# Patient Record
Sex: Male | Born: 1935 | Race: White | Hispanic: No | Smoking: Former smoker
Health system: Southern US, Community
[De-identification: ages and names within clinical notes are randomized; demographics above are authoritative.]

## PROBLEM LIST (undated history)

## (undated) DIAGNOSIS — C801 Malignant (primary) neoplasm, unspecified: Secondary | ICD-10-CM

## (undated) DIAGNOSIS — R011 Cardiac murmur, unspecified: Secondary | ICD-10-CM

## (undated) DIAGNOSIS — I10 Essential (primary) hypertension: Secondary | ICD-10-CM

## (undated) DIAGNOSIS — E785 Hyperlipidemia, unspecified: Secondary | ICD-10-CM

## (undated) DIAGNOSIS — I219 Acute myocardial infarction, unspecified: Secondary | ICD-10-CM

## (undated) DIAGNOSIS — D649 Anemia, unspecified: Secondary | ICD-10-CM

## (undated) DIAGNOSIS — I4891 Unspecified atrial fibrillation: Secondary | ICD-10-CM

## (undated) DIAGNOSIS — G459 Transient cerebral ischemic attack, unspecified: Secondary | ICD-10-CM

## (undated) DIAGNOSIS — Z5189 Encounter for other specified aftercare: Secondary | ICD-10-CM

## (undated) DIAGNOSIS — M1990 Unspecified osteoarthritis, unspecified site: Secondary | ICD-10-CM

## (undated) HISTORY — PX: CORONARY ARTERY BYPASS GRAFT: SHX141

## (undated) HISTORY — DX: Unspecified atrial fibrillation: I48.91

## (undated) HISTORY — DX: Acute myocardial infarction, unspecified: I21.9

## (undated) HISTORY — PX: OTHER SURGICAL HISTORY: SHX169

## (undated) HISTORY — PX: THORACOTOMY: SUR1349

## (undated) HISTORY — PX: COLON SURGERY: SHX602

## (undated) HISTORY — DX: Anemia, unspecified: D64.9

## (undated) HISTORY — DX: Encounter for other specified aftercare: Z51.89

## (undated) HISTORY — DX: Unspecified osteoarthritis, unspecified site: M19.90

## (undated) HISTORY — PX: TOTAL HIP ARTHROPLASTY: SHX124

## (undated) HISTORY — DX: Transient cerebral ischemic attack, unspecified: G45.9

## (undated) HISTORY — DX: Hyperlipidemia, unspecified: E78.5

## (undated) HISTORY — DX: Cardiac murmur, unspecified: R01.1

## (undated) HISTORY — DX: Malignant (primary) neoplasm, unspecified: C80.1

## (undated) HISTORY — DX: Essential (primary) hypertension: I10

---

## 2000-12-03 ENCOUNTER — Encounter (INDEPENDENT_AMBULATORY_CARE_PROVIDER_SITE_OTHER): Payer: Self-pay

## 2000-12-03 ENCOUNTER — Encounter: Payer: Self-pay | Admitting: Gastroenterology

## 2000-12-03 ENCOUNTER — Other Ambulatory Visit: Admission: RE | Admit: 2000-12-03 | Discharge: 2000-12-03 | Payer: Self-pay | Admitting: Gastroenterology

## 2001-03-04 ENCOUNTER — Encounter: Payer: Self-pay | Admitting: Gastroenterology

## 2001-03-04 ENCOUNTER — Encounter (INDEPENDENT_AMBULATORY_CARE_PROVIDER_SITE_OTHER): Payer: Self-pay | Admitting: Specialist

## 2001-03-04 ENCOUNTER — Other Ambulatory Visit: Admission: RE | Admit: 2001-03-04 | Discharge: 2001-03-04 | Payer: Self-pay | Admitting: Gastroenterology

## 2001-03-14 ENCOUNTER — Inpatient Hospital Stay (HOSPITAL_COMMUNITY): Admission: EM | Admit: 2001-03-14 | Discharge: 2001-03-21 | Payer: Self-pay | Admitting: Emergency Medicine

## 2001-03-14 ENCOUNTER — Encounter: Payer: Self-pay | Admitting: Emergency Medicine

## 2001-03-14 ENCOUNTER — Encounter (INDEPENDENT_AMBULATORY_CARE_PROVIDER_SITE_OTHER): Payer: Self-pay | Admitting: *Deleted

## 2001-03-15 ENCOUNTER — Encounter: Payer: Self-pay | Admitting: Orthopedic Surgery

## 2001-03-21 ENCOUNTER — Inpatient Hospital Stay (HOSPITAL_COMMUNITY)
Admission: AD | Admit: 2001-03-21 | Discharge: 2001-03-27 | Payer: Self-pay | Admitting: Physical Medicine & Rehabilitation

## 2002-10-01 ENCOUNTER — Encounter: Payer: Self-pay | Admitting: Gastroenterology

## 2004-03-14 ENCOUNTER — Encounter: Payer: Self-pay | Admitting: Gastroenterology

## 2004-03-14 ENCOUNTER — Encounter (INDEPENDENT_AMBULATORY_CARE_PROVIDER_SITE_OTHER): Payer: Self-pay | Admitting: *Deleted

## 2004-05-09 ENCOUNTER — Inpatient Hospital Stay (HOSPITAL_COMMUNITY): Admission: AD | Admit: 2004-05-09 | Discharge: 2004-05-27 | Payer: Self-pay | Admitting: General Surgery

## 2004-05-09 ENCOUNTER — Encounter (INDEPENDENT_AMBULATORY_CARE_PROVIDER_SITE_OTHER): Payer: Self-pay | Admitting: *Deleted

## 2004-05-09 ENCOUNTER — Ambulatory Visit: Payer: Self-pay | Admitting: Internal Medicine

## 2004-05-15 ENCOUNTER — Encounter: Payer: Self-pay | Admitting: Cardiology

## 2004-06-14 ENCOUNTER — Ambulatory Visit: Payer: Self-pay | Admitting: *Deleted

## 2004-07-05 ENCOUNTER — Ambulatory Visit: Payer: Self-pay | Admitting: *Deleted

## 2004-07-06 ENCOUNTER — Ambulatory Visit: Payer: Self-pay | Admitting: Internal Medicine

## 2004-07-06 ENCOUNTER — Inpatient Hospital Stay (HOSPITAL_COMMUNITY): Admission: EM | Admit: 2004-07-06 | Discharge: 2004-07-10 | Payer: Self-pay | Admitting: Emergency Medicine

## 2004-07-11 ENCOUNTER — Emergency Department (HOSPITAL_COMMUNITY): Admission: EM | Admit: 2004-07-11 | Discharge: 2004-07-12 | Payer: Self-pay | Admitting: Emergency Medicine

## 2004-07-19 ENCOUNTER — Ambulatory Visit: Payer: Self-pay | Admitting: Gastroenterology

## 2004-08-14 ENCOUNTER — Encounter (HOSPITAL_COMMUNITY): Admission: RE | Admit: 2004-08-14 | Discharge: 2004-11-12 | Payer: Self-pay | Admitting: *Deleted

## 2004-09-04 ENCOUNTER — Ambulatory Visit: Payer: Self-pay | Admitting: Gastroenterology

## 2004-09-06 ENCOUNTER — Ambulatory Visit: Payer: Self-pay | Admitting: Gastroenterology

## 2004-10-02 ENCOUNTER — Ambulatory Visit: Payer: Self-pay | Admitting: Gastroenterology

## 2004-11-13 ENCOUNTER — Encounter (HOSPITAL_COMMUNITY): Admission: RE | Admit: 2004-11-13 | Discharge: 2004-11-27 | Payer: Self-pay | Admitting: *Deleted

## 2004-11-20 ENCOUNTER — Ambulatory Visit: Payer: Self-pay | Admitting: Gastroenterology

## 2005-06-29 ENCOUNTER — Ambulatory Visit: Payer: Self-pay | Admitting: Internal Medicine

## 2006-02-20 IMAGING — CR DG CHEST 2V
2 series · 2 of 2 positions shown · non-contrast
Comparison: none

CLINICAL DATA: Preoperative evaluation. 
 PA AND LATERAL CHEST 05/01/04
 Postsurgical changes secondary to median sternotomy and CABG procedure.  The heart is upper normal.  
 IMPRESSION
 As above.

[view not recorded (1 of 2)]
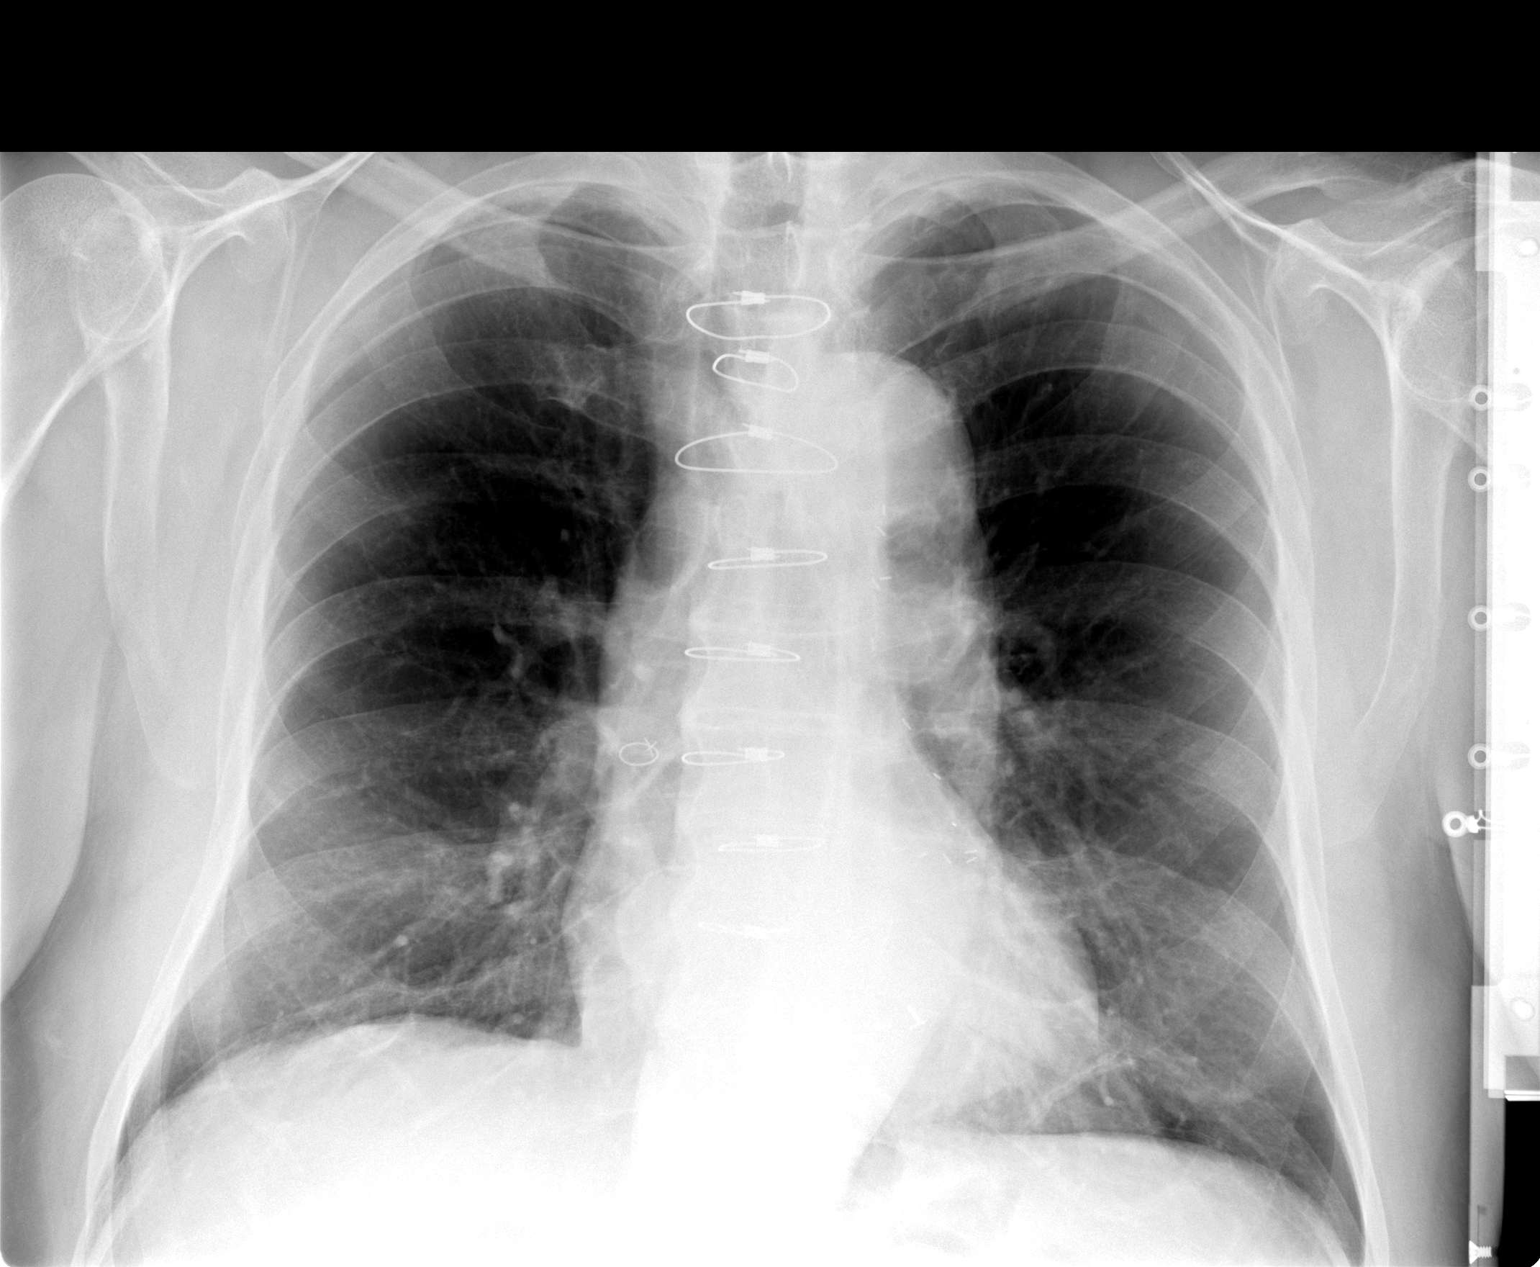

[view not recorded (2 of 2)]
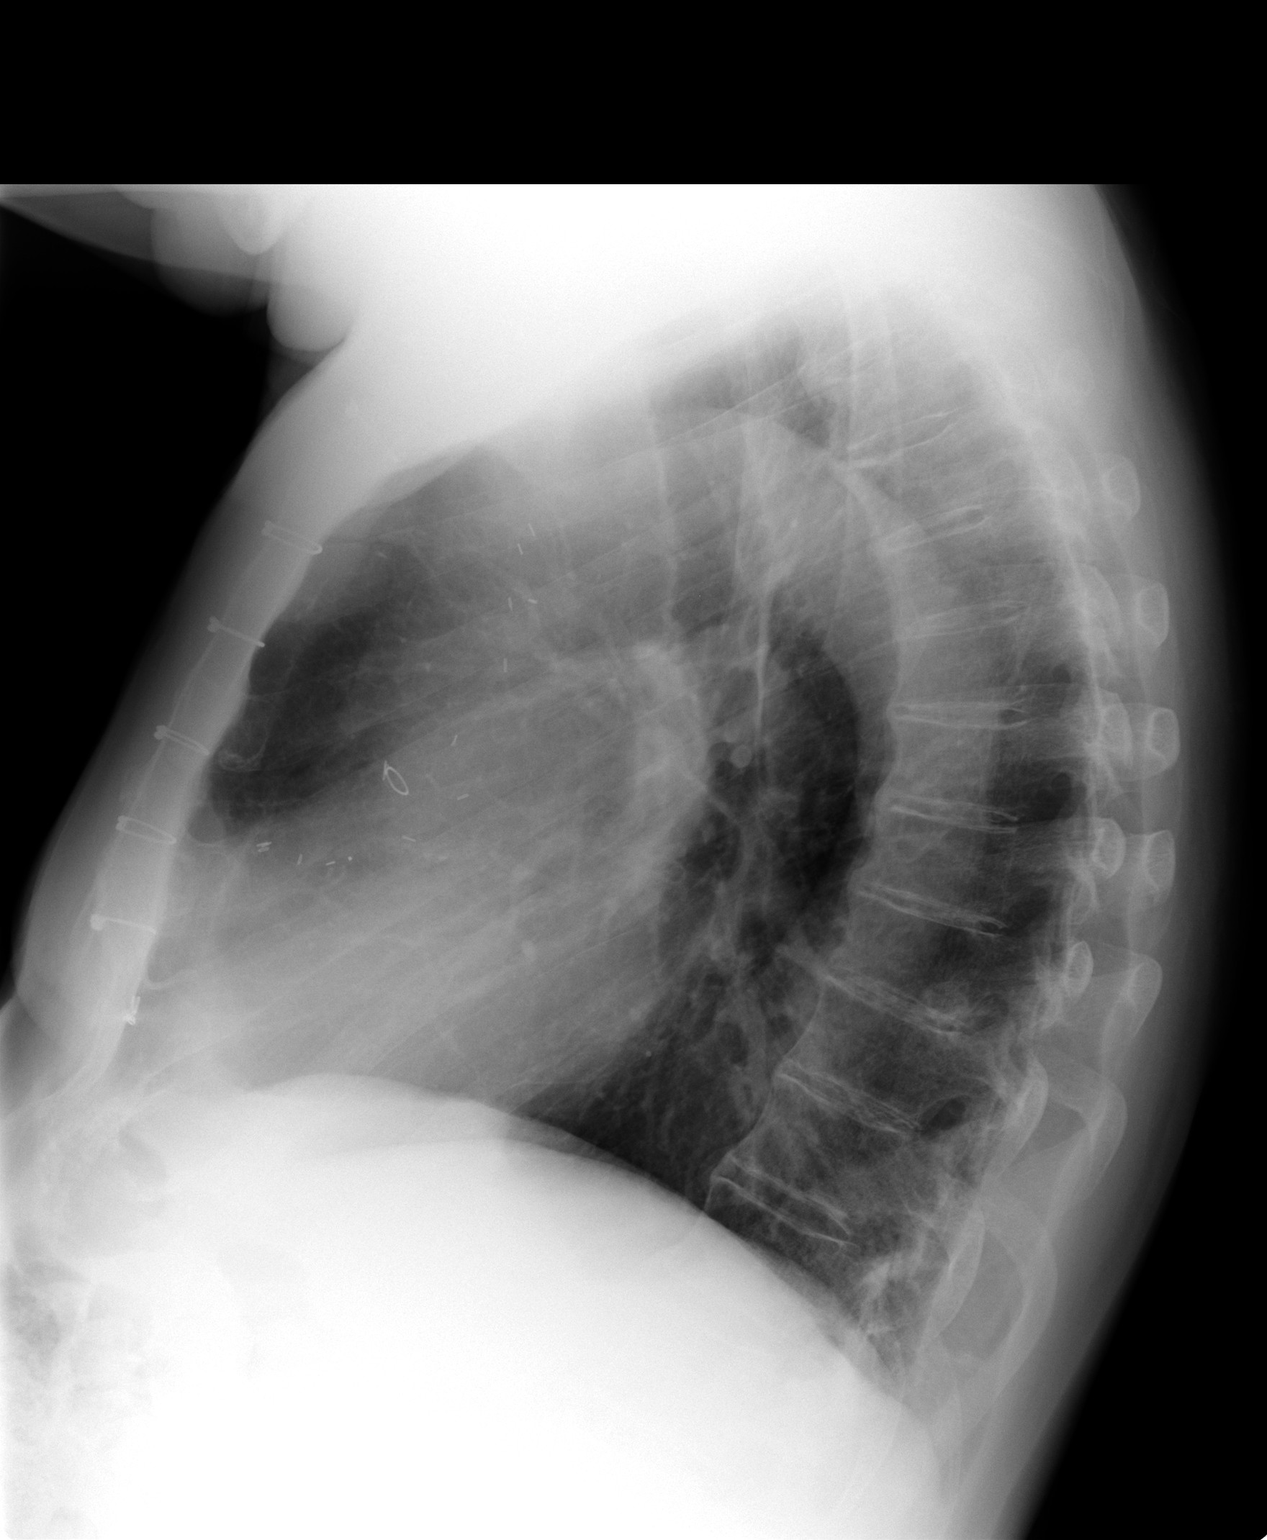

[2 of 2 positions shown; findings below may reference images not displayed]

## 2006-07-15 ENCOUNTER — Ambulatory Visit: Payer: Self-pay | Admitting: *Deleted

## 2006-07-17 ENCOUNTER — Ambulatory Visit: Payer: Self-pay | Admitting: *Deleted

## 2006-10-17 ENCOUNTER — Ambulatory Visit: Payer: Self-pay

## 2006-10-24 ENCOUNTER — Ambulatory Visit: Payer: Self-pay | Admitting: *Deleted

## 2006-11-12 ENCOUNTER — Ambulatory Visit: Payer: Self-pay | Admitting: *Deleted

## 2006-11-27 ENCOUNTER — Ambulatory Visit: Payer: Self-pay

## 2006-12-12 ENCOUNTER — Ambulatory Visit: Payer: Self-pay | Admitting: *Deleted

## 2006-12-16 ENCOUNTER — Ambulatory Visit: Payer: Self-pay | Admitting: *Deleted

## 2006-12-16 ENCOUNTER — Ambulatory Visit: Payer: Self-pay | Admitting: Cardiology

## 2006-12-17 LAB — CONVERTED CEMR LAB
ALT: 13 units/L (ref 0–40)
AST: 20 units/L (ref 0–37)
Albumin: 3.6 g/dL (ref 3.5–5.2)
Alkaline Phosphatase: 74 units/L (ref 39–117)
BUN: 17 mg/dL (ref 6–23)
Bilirubin, Direct: 0.1 mg/dL (ref 0.0–0.3)
CO2: 28 meq/L (ref 19–32)
Calcium: 8.7 mg/dL (ref 8.4–10.5)
Chloride: 107 meq/L (ref 96–112)
Cholesterol: 127 mg/dL (ref 0–200)
Creatinine, Ser: 1.1 mg/dL (ref 0.4–1.5)
GFR calc Af Amer: 83 mL/min
GFR calc non Af Amer: 68 mL/min
Glucose, Bld: 108 mg/dL — ABNORMAL HIGH (ref 70–99)
HDL: 27.8 mg/dL — ABNORMAL LOW (ref >39.0)
LDL Cholesterol: 74 mg/dL (ref 0–99)
Potassium: 4 meq/L (ref 3.5–5.1)
Sodium: 139 meq/L (ref 135–145)
TSH: 1.88 microintl units/mL (ref 0.35–5.50)
Total Bilirubin: 0.8 mg/dL (ref 0.3–1.2)
Total CHOL/HDL Ratio: 4.6
Total Protein: 6.8 g/dL (ref 6.0–8.3)
Triglycerides: 127 mg/dL (ref 0–149)
VLDL: 25 mg/dL (ref 0–40)

## 2006-12-19 ENCOUNTER — Ambulatory Visit: Payer: Self-pay | Admitting: Cardiology

## 2006-12-25 ENCOUNTER — Ambulatory Visit: Payer: Self-pay

## 2006-12-25 ENCOUNTER — Ambulatory Visit: Payer: Self-pay | Admitting: Cardiology

## 2006-12-25 ENCOUNTER — Ambulatory Visit: Payer: Self-pay | Admitting: Internal Medicine

## 2006-12-25 LAB — CONVERTED CEMR LAB
INR: 6 (ref 0.9–2.0)
Prothrombin Time: 31.8 s (ref 10.0–14.0)

## 2006-12-31 ENCOUNTER — Ambulatory Visit: Payer: Self-pay | Admitting: Cardiology

## 2007-01-07 ENCOUNTER — Ambulatory Visit: Payer: Self-pay | Admitting: Cardiology

## 2007-01-17 ENCOUNTER — Ambulatory Visit: Payer: Self-pay | Admitting: Cardiovascular Disease

## 2007-01-29 ENCOUNTER — Ambulatory Visit: Payer: Self-pay | Admitting: Cardiology

## 2007-02-12 ENCOUNTER — Ambulatory Visit: Payer: Self-pay | Admitting: Internal Medicine

## 2007-03-05 ENCOUNTER — Ambulatory Visit: Payer: Self-pay | Admitting: Cardiology

## 2007-03-26 ENCOUNTER — Ambulatory Visit: Payer: Self-pay | Admitting: Internal Medicine

## 2007-04-02 ENCOUNTER — Ambulatory Visit: Payer: Self-pay | Admitting: Internal Medicine

## 2007-04-30 ENCOUNTER — Ambulatory Visit: Payer: Self-pay | Admitting: Cardiology

## 2007-05-28 ENCOUNTER — Ambulatory Visit: Payer: Self-pay | Admitting: Cardiology

## 2007-06-24 ENCOUNTER — Ambulatory Visit: Payer: Self-pay | Admitting: Internal Medicine

## 2007-07-22 ENCOUNTER — Ambulatory Visit: Payer: Self-pay | Admitting: Cardiology

## 2007-07-28 ENCOUNTER — Ambulatory Visit: Payer: Self-pay | Admitting: Gastroenterology

## 2007-08-01 ENCOUNTER — Encounter: Payer: Self-pay | Admitting: Internal Medicine

## 2007-08-19 ENCOUNTER — Ambulatory Visit: Payer: Self-pay | Admitting: Cardiovascular Disease

## 2007-08-21 ENCOUNTER — Ambulatory Visit: Payer: Self-pay | Admitting: Internal Medicine

## 2007-08-21 LAB — CONVERTED CEMR LAB
ALT: 18 units/L (ref 0–53)
AST: 23 units/L (ref 0–37)
Albumin: 3.8 g/dL (ref 3.5–5.2)
Alkaline Phosphatase: 76 units/L (ref 39–117)
Bilirubin, Direct: 0.2 mg/dL (ref 0.0–0.3)
Cholesterol: 137 mg/dL (ref 0–200)
HDL: 26.8 mg/dL — ABNORMAL LOW (ref >39.0)
LDL Cholesterol: 81 mg/dL (ref 0–99)
Total Bilirubin: 1.5 mg/dL — ABNORMAL HIGH (ref 0.3–1.2)
Total CHOL/HDL Ratio: 5.1
Total Protein: 7.7 g/dL (ref 6.0–8.3)
Triglycerides: 144 mg/dL (ref 0–149)
VLDL: 29 mg/dL (ref 0–40)

## 2007-08-25 ENCOUNTER — Ambulatory Visit: Payer: Self-pay | Admitting: Internal Medicine

## 2007-09-16 ENCOUNTER — Ambulatory Visit: Payer: Self-pay | Admitting: Cardiology

## 2007-10-14 ENCOUNTER — Telehealth: Payer: Self-pay | Admitting: Internal Medicine

## 2007-10-14 ENCOUNTER — Ambulatory Visit: Payer: Self-pay | Admitting: Cardiology

## 2007-10-17 ENCOUNTER — Ambulatory Visit: Payer: Self-pay | Admitting: Internal Medicine

## 2007-10-17 DIAGNOSIS — Z860100 Personal history of colon polyps, unspecified: Secondary | ICD-10-CM | POA: Insufficient documentation

## 2007-10-17 DIAGNOSIS — I10 Essential (primary) hypertension: Secondary | ICD-10-CM

## 2007-10-17 DIAGNOSIS — Z85038 Personal history of other malignant neoplasm of large intestine: Secondary | ICD-10-CM | POA: Insufficient documentation

## 2007-10-17 DIAGNOSIS — N400 Enlarged prostate without lower urinary tract symptoms: Secondary | ICD-10-CM

## 2007-10-17 DIAGNOSIS — D509 Iron deficiency anemia, unspecified: Secondary | ICD-10-CM

## 2007-10-17 DIAGNOSIS — I4891 Unspecified atrial fibrillation: Secondary | ICD-10-CM

## 2007-10-17 DIAGNOSIS — E785 Hyperlipidemia, unspecified: Secondary | ICD-10-CM

## 2007-10-17 DIAGNOSIS — I252 Old myocardial infarction: Secondary | ICD-10-CM | POA: Insufficient documentation

## 2007-10-17 DIAGNOSIS — M109 Gout, unspecified: Secondary | ICD-10-CM

## 2007-10-17 DIAGNOSIS — Z8679 Personal history of other diseases of the circulatory system: Secondary | ICD-10-CM

## 2007-10-17 DIAGNOSIS — I2510 Atherosclerotic heart disease of native coronary artery without angina pectoris: Secondary | ICD-10-CM | POA: Insufficient documentation

## 2007-10-17 DIAGNOSIS — Z8719 Personal history of other diseases of the digestive system: Secondary | ICD-10-CM

## 2007-10-17 DIAGNOSIS — I739 Peripheral vascular disease, unspecified: Secondary | ICD-10-CM

## 2007-10-31 ENCOUNTER — Encounter: Payer: Self-pay | Admitting: Internal Medicine

## 2007-11-11 ENCOUNTER — Ambulatory Visit: Payer: Self-pay | Admitting: Cardiology

## 2007-12-09 ENCOUNTER — Ambulatory Visit: Payer: Self-pay | Admitting: Cardiovascular Disease

## 2008-01-06 ENCOUNTER — Ambulatory Visit: Payer: Self-pay | Admitting: Cardiovascular Disease

## 2008-02-03 ENCOUNTER — Ambulatory Visit: Payer: Self-pay | Admitting: Cardiovascular Disease

## 2008-02-17 ENCOUNTER — Ambulatory Visit: Payer: Self-pay | Admitting: Cardiology

## 2008-03-16 ENCOUNTER — Ambulatory Visit: Payer: Self-pay | Admitting: Cardiology

## 2008-03-19 ENCOUNTER — Ambulatory Visit: Payer: Self-pay | Admitting: Internal Medicine

## 2008-03-29 ENCOUNTER — Encounter: Payer: Self-pay | Admitting: Gastroenterology

## 2008-04-13 ENCOUNTER — Ambulatory Visit: Payer: Self-pay | Admitting: Cardiology

## 2008-05-11 ENCOUNTER — Ambulatory Visit: Payer: Self-pay | Admitting: Cardiovascular Disease

## 2008-06-01 ENCOUNTER — Ambulatory Visit: Payer: Self-pay | Admitting: Internal Medicine

## 2008-06-28 ENCOUNTER — Ambulatory Visit: Payer: Self-pay | Admitting: Cardiovascular Disease

## 2008-07-14 ENCOUNTER — Ambulatory Visit: Payer: Self-pay | Admitting: Internal Medicine

## 2008-07-14 ENCOUNTER — Inpatient Hospital Stay (HOSPITAL_COMMUNITY): Admission: AD | Admit: 2008-07-14 | Discharge: 2008-07-18 | Payer: Self-pay | Admitting: Internal Medicine

## 2008-07-14 DIAGNOSIS — N410 Acute prostatitis: Secondary | ICD-10-CM

## 2008-07-14 DIAGNOSIS — A4189 Other specified sepsis: Secondary | ICD-10-CM

## 2008-07-20 ENCOUNTER — Telehealth (INDEPENDENT_AMBULATORY_CARE_PROVIDER_SITE_OTHER): Payer: Self-pay | Admitting: *Deleted

## 2008-07-21 ENCOUNTER — Ambulatory Visit: Payer: Self-pay | Admitting: Internal Medicine

## 2008-07-21 DIAGNOSIS — R350 Frequency of micturition: Secondary | ICD-10-CM

## 2008-07-21 DIAGNOSIS — J189 Pneumonia, unspecified organism: Secondary | ICD-10-CM | POA: Insufficient documentation

## 2008-08-04 ENCOUNTER — Ambulatory Visit: Payer: Self-pay | Admitting: Cardiovascular Disease

## 2008-08-18 ENCOUNTER — Ambulatory Visit: Payer: Self-pay | Admitting: Cardiovascular Disease

## 2008-08-24 ENCOUNTER — Ambulatory Visit: Payer: Self-pay | Admitting: Internal Medicine

## 2008-09-08 ENCOUNTER — Encounter: Payer: Self-pay | Admitting: Internal Medicine

## 2008-09-08 ENCOUNTER — Ambulatory Visit: Payer: Self-pay | Admitting: Cardiology

## 2008-10-06 ENCOUNTER — Ambulatory Visit: Payer: Self-pay | Admitting: Cardiovascular Disease

## 2008-11-10 ENCOUNTER — Ambulatory Visit: Payer: Self-pay | Admitting: Internal Medicine

## 2008-12-08 ENCOUNTER — Ambulatory Visit: Payer: Self-pay | Admitting: Cardiology

## 2008-12-15 ENCOUNTER — Ambulatory Visit: Payer: Self-pay | Admitting: Internal Medicine

## 2008-12-15 LAB — CONVERTED CEMR LAB
ALT: 16 units/L (ref 0–53)
AST: 30 units/L (ref 0–37)
Albumin: 3.8 g/dL (ref 3.5–5.2)
Alkaline Phosphatase: 79 units/L (ref 39–117)
BUN: 17 mg/dL (ref 6–23)
Bilirubin, Direct: 0.4 mg/dL — ABNORMAL HIGH (ref 0.0–0.3)
CO2: 29 meq/L (ref 19–32)
Calcium: 9 mg/dL (ref 8.4–10.5)
Chloride: 109 meq/L (ref 96–112)
Cholesterol: 142 mg/dL (ref 0–200)
Creatinine, Ser: 1 mg/dL (ref 0.4–1.5)
GFR calc non Af Amer: 75.8 mL/min (ref >60)
Glucose, Bld: 117 mg/dL — ABNORMAL HIGH (ref 70–99)
HDL: 28.7 mg/dL — ABNORMAL LOW (ref >39.00)
LDL Cholesterol: 94 mg/dL (ref 0–99)
Potassium: 4.4 meq/L (ref 3.5–5.1)
Sodium: 145 meq/L (ref 135–145)
TSH: 1.54 microintl units/mL (ref 0.35–5.50)
Total Bilirubin: 1.6 mg/dL — ABNORMAL HIGH (ref 0.3–1.2)
Total CHOL/HDL Ratio: 5
Total Protein: 7 g/dL (ref 6.0–8.3)
Triglycerides: 99 mg/dL (ref 0.0–149.0)
VLDL: 19.8 mg/dL (ref 0.0–40.0)

## 2008-12-22 ENCOUNTER — Telehealth (INDEPENDENT_AMBULATORY_CARE_PROVIDER_SITE_OTHER): Payer: Self-pay | Admitting: *Deleted

## 2008-12-22 ENCOUNTER — Ambulatory Visit: Payer: Self-pay | Admitting: Internal Medicine

## 2008-12-22 DIAGNOSIS — L570 Actinic keratosis: Secondary | ICD-10-CM

## 2008-12-22 DIAGNOSIS — R609 Edema, unspecified: Secondary | ICD-10-CM

## 2009-01-05 ENCOUNTER — Ambulatory Visit: Payer: Self-pay | Admitting: Internal Medicine

## 2009-01-05 LAB — CONVERTED CEMR LAB
POC INR: 2.6
Protime: 19.7

## 2009-02-02 ENCOUNTER — Ambulatory Visit: Payer: Self-pay | Admitting: Internal Medicine

## 2009-02-02 ENCOUNTER — Encounter: Payer: Self-pay | Admitting: *Deleted

## 2009-02-02 LAB — CONVERTED CEMR LAB
POC INR: 2.8
Prothrombin Time: 20.2 s

## 2009-02-09 ENCOUNTER — Encounter (INDEPENDENT_AMBULATORY_CARE_PROVIDER_SITE_OTHER): Payer: Self-pay | Admitting: *Deleted

## 2009-02-09 ENCOUNTER — Ambulatory Visit: Payer: Self-pay | Admitting: Gastroenterology

## 2009-02-09 ENCOUNTER — Ambulatory Visit: Payer: Self-pay | Admitting: Internal Medicine

## 2009-02-09 LAB — CONVERTED CEMR LAB
BUN: 19 mg/dL (ref 6–23)
CO2: 29 meq/L (ref 19–32)
Calcium: 9.2 mg/dL (ref 8.4–10.5)
Chloride: 109 meq/L (ref 96–112)
Creatinine, Ser: 1 mg/dL (ref 0.4–1.5)
GFR calc non Af Amer: 75.78 mL/min (ref >60)
Glucose, Bld: 101 mg/dL — ABNORMAL HIGH (ref 70–99)
Potassium: 4.5 meq/L (ref 3.5–5.1)
Sodium: 143 meq/L (ref 135–145)

## 2009-02-11 ENCOUNTER — Ambulatory Visit: Payer: Self-pay | Admitting: Internal Medicine

## 2009-02-18 ENCOUNTER — Telehealth (INDEPENDENT_AMBULATORY_CARE_PROVIDER_SITE_OTHER): Payer: Self-pay | Admitting: *Deleted

## 2009-02-28 ENCOUNTER — Ambulatory Visit: Payer: Self-pay | Admitting: Cardiovascular Disease

## 2009-02-28 ENCOUNTER — Ambulatory Visit: Payer: Self-pay | Admitting: Internal Medicine

## 2009-02-28 ENCOUNTER — Inpatient Hospital Stay (HOSPITAL_COMMUNITY): Admission: EM | Admit: 2009-02-28 | Discharge: 2009-03-02 | Payer: Self-pay | Admitting: Emergency Medicine

## 2009-03-01 ENCOUNTER — Encounter (INDEPENDENT_AMBULATORY_CARE_PROVIDER_SITE_OTHER): Payer: Self-pay | Admitting: Internal Medicine

## 2009-03-02 ENCOUNTER — Ambulatory Visit: Payer: Self-pay | Admitting: Internal Medicine

## 2009-03-03 ENCOUNTER — Telehealth (INDEPENDENT_AMBULATORY_CARE_PROVIDER_SITE_OTHER): Payer: Self-pay | Admitting: *Deleted

## 2009-03-04 ENCOUNTER — Telehealth: Payer: Self-pay | Admitting: Internal Medicine

## 2009-03-05 ENCOUNTER — Telehealth (INDEPENDENT_AMBULATORY_CARE_PROVIDER_SITE_OTHER): Payer: Self-pay | Admitting: Nurse Practitioner

## 2009-03-08 ENCOUNTER — Telehealth: Payer: Self-pay | Admitting: Internal Medicine

## 2009-03-14 ENCOUNTER — Ambulatory Visit: Payer: Self-pay | Admitting: Cardiovascular Disease

## 2009-03-14 LAB — CONVERTED CEMR LAB: POC INR: 2.5

## 2009-03-21 ENCOUNTER — Telehealth: Payer: Self-pay | Admitting: Nurse Practitioner

## 2009-03-21 ENCOUNTER — Telehealth: Payer: Self-pay | Admitting: Internal Medicine

## 2009-03-22 ENCOUNTER — Ambulatory Visit: Payer: Self-pay | Admitting: Internal Medicine

## 2009-03-28 ENCOUNTER — Ambulatory Visit: Payer: Self-pay | Admitting: Internal Medicine

## 2009-03-28 DIAGNOSIS — R3919 Other difficulties with micturition: Secondary | ICD-10-CM

## 2009-03-28 DIAGNOSIS — Z85828 Personal history of other malignant neoplasm of skin: Secondary | ICD-10-CM | POA: Insufficient documentation

## 2009-04-05 ENCOUNTER — Telehealth: Payer: Self-pay | Admitting: Gastroenterology

## 2009-04-05 ENCOUNTER — Telehealth: Payer: Self-pay | Admitting: Internal Medicine

## 2009-04-06 ENCOUNTER — Ambulatory Visit: Payer: Self-pay | Admitting: Internal Medicine

## 2009-04-11 ENCOUNTER — Ambulatory Visit: Payer: Self-pay | Admitting: Cardiovascular Disease

## 2009-04-11 LAB — CONVERTED CEMR LAB: POC INR: 2.9

## 2009-04-28 ENCOUNTER — Ambulatory Visit: Payer: Self-pay | Admitting: Internal Medicine

## 2009-05-18 ENCOUNTER — Encounter: Payer: Self-pay | Admitting: Internal Medicine

## 2009-05-20 ENCOUNTER — Ambulatory Visit: Payer: Self-pay | Admitting: Cardiovascular Disease

## 2009-05-20 LAB — CONVERTED CEMR LAB: POC INR: 2.5

## 2009-06-01 ENCOUNTER — Encounter: Payer: Self-pay | Admitting: Internal Medicine

## 2009-06-08 ENCOUNTER — Ambulatory Visit: Payer: Self-pay | Admitting: Internal Medicine

## 2009-06-08 LAB — CONVERTED CEMR LAB
ALT: 21 units/L (ref 0–53)
AST: 27 units/L (ref 0–37)
Albumin: 3.9 g/dL (ref 3.5–5.2)
Alkaline Phosphatase: 70 units/L (ref 39–117)
BUN: 14 mg/dL (ref 6–23)
Basophils Absolute: 0 10*3/uL (ref 0.0–0.1)
Basophils Relative: 0.1 % (ref 0.0–3.0)
Bilirubin, Direct: 0.2 mg/dL (ref 0.0–0.3)
CO2: 27 meq/L (ref 19–32)
Calcium: 8.8 mg/dL (ref 8.4–10.5)
Chloride: 105 meq/L (ref 96–112)
Cholesterol: 157 mg/dL (ref 0–200)
Cortisol, Plasma: 11 ug/dL
Creatinine, Ser: 1 mg/dL (ref 0.4–1.5)
Eosinophils Absolute: 0.3 10*3/uL (ref 0.0–0.7)
Eosinophils Relative: 4.2 % (ref 0.0–5.0)
GFR calc non Af Amer: 75.72 mL/min (ref >60)
Glucose, Bld: 105 mg/dL — ABNORMAL HIGH (ref 70–99)
HCT: 43.9 % (ref 39.0–52.0)
HDL: 31.3 mg/dL — ABNORMAL LOW (ref >39.00)
Hemoglobin: 14.9 g/dL (ref 13.0–17.0)
LDL Cholesterol: 104 mg/dL — ABNORMAL HIGH (ref 0–99)
Lymphocytes Relative: 21.4 % (ref 12.0–46.0)
Lymphs Abs: 1.6 10*3/uL (ref 0.7–4.0)
MCHC: 33.9 g/dL (ref 30.0–36.0)
MCV: 91.4 fL (ref 78.0–100.0)
Monocytes Absolute: 0.4 10*3/uL (ref 0.1–1.0)
Monocytes Relative: 5.7 % (ref 3.0–12.0)
Neutro Abs: 5.2 10*3/uL (ref 1.4–7.7)
Neutrophils Relative %: 68.6 % (ref 43.0–77.0)
Platelets: 76 10*3/uL — ABNORMAL LOW (ref 150.0–400.0)
Potassium: 4 meq/L (ref 3.5–5.1)
RBC: 4.8 M/uL (ref 4.22–5.81)
RDW: 13 % (ref 11.5–14.6)
Sodium: 140 meq/L (ref 135–145)
TSH: 1.42 microintl units/mL (ref 0.35–5.50)
Total Bilirubin: 1.2 mg/dL (ref 0.3–1.2)
Total CHOL/HDL Ratio: 5
Total Protein: 7.6 g/dL (ref 6.0–8.3)
Triglycerides: 109 mg/dL (ref 0.0–149.0)
VLDL: 21.8 mg/dL (ref 0.0–40.0)
WBC: 7.5 10*3/uL (ref 4.5–10.5)

## 2009-06-14 ENCOUNTER — Ambulatory Visit: Payer: Self-pay | Admitting: Internal Medicine

## 2009-06-14 DIAGNOSIS — D696 Thrombocytopenia, unspecified: Secondary | ICD-10-CM | POA: Insufficient documentation

## 2009-06-14 DIAGNOSIS — Z87891 Personal history of nicotine dependence: Secondary | ICD-10-CM

## 2009-06-17 ENCOUNTER — Ambulatory Visit: Payer: Self-pay | Admitting: Cardiology

## 2009-06-17 LAB — CONVERTED CEMR LAB: POC INR: 2.6

## 2009-07-21 ENCOUNTER — Ambulatory Visit: Payer: Self-pay | Admitting: Cardiology

## 2009-07-21 LAB — CONVERTED CEMR LAB: POC INR: 2.4

## 2009-08-15 ENCOUNTER — Ambulatory Visit: Payer: Self-pay | Admitting: Internal Medicine

## 2009-08-15 LAB — CONVERTED CEMR LAB
BUN: 16 mg/dL (ref 6–23)
Basophils Absolute: 0 10*3/uL (ref 0.0–0.1)
Basophils Relative: 0.1 % (ref 0.0–3.0)
CO2: 23 meq/L (ref 19–32)
Calcium: 9.4 mg/dL (ref 8.4–10.5)
Chloride: 109 meq/L (ref 96–112)
Creatinine, Ser: 1 mg/dL (ref 0.4–1.5)
Eosinophils Absolute: 0.4 10*3/uL (ref 0.0–0.7)
Eosinophils Relative: 4.5 % (ref 0.0–5.0)
GFR calc non Af Amer: 75.68 mL/min (ref >60)
Glucose, Bld: 96 mg/dL (ref 70–99)
HCT: 46.6 % (ref 39.0–52.0)
Hemoglobin: 14.9 g/dL (ref 13.0–17.0)
Lymphocytes Relative: 18.5 % (ref 12.0–46.0)
Lymphs Abs: 1.6 10*3/uL (ref 0.7–4.0)
MCHC: 32.1 g/dL (ref 30.0–36.0)
MCV: 91.1 fL (ref 78.0–100.0)
Monocytes Absolute: 0.6 10*3/uL (ref 0.1–1.0)
Monocytes Relative: 6.5 % (ref 3.0–12.0)
Neutro Abs: 5.9 10*3/uL (ref 1.4–7.7)
Neutrophils Relative %: 70.4 % (ref 43.0–77.0)
Platelets: 90 10*3/uL — ABNORMAL LOW (ref 150.0–400.0)
Potassium: 5.2 meq/L — ABNORMAL HIGH (ref 3.5–5.1)
RBC: 5.11 M/uL (ref 4.22–5.81)
RDW: 13.8 % (ref 11.5–14.6)
Sodium: 142 meq/L (ref 135–145)
WBC: 8.5 10*3/uL (ref 4.5–10.5)

## 2009-08-16 ENCOUNTER — Ambulatory Visit: Payer: Self-pay | Admitting: Cardiology

## 2009-08-16 LAB — CONVERTED CEMR LAB: POC INR: 2.1

## 2009-08-17 ENCOUNTER — Ambulatory Visit: Payer: Self-pay | Admitting: Internal Medicine

## 2009-08-17 DIAGNOSIS — E875 Hyperkalemia: Secondary | ICD-10-CM

## 2009-09-13 ENCOUNTER — Ambulatory Visit: Payer: Self-pay | Admitting: Internal Medicine

## 2009-09-13 LAB — CONVERTED CEMR LAB: POC INR: 2.2

## 2009-10-11 ENCOUNTER — Ambulatory Visit: Payer: Self-pay | Admitting: Cardiology

## 2009-10-11 LAB — CONVERTED CEMR LAB: POC INR: 2.1

## 2009-10-26 ENCOUNTER — Ambulatory Visit: Payer: Self-pay | Admitting: Internal Medicine

## 2009-11-08 ENCOUNTER — Ambulatory Visit: Payer: Self-pay | Admitting: Cardiovascular Disease

## 2009-11-08 LAB — CONVERTED CEMR LAB: POC INR: 2.1

## 2009-11-15 ENCOUNTER — Ambulatory Visit: Payer: Self-pay | Admitting: Internal Medicine

## 2009-11-15 LAB — CONVERTED CEMR LAB
BUN: 18 mg/dL (ref 6–23)
Basophils Absolute: 0 10*3/uL (ref 0.0–0.1)
Basophils Relative: 0.5 % (ref 0.0–3.0)
CO2: 29 meq/L (ref 19–32)
Calcium: 9.2 mg/dL (ref 8.4–10.5)
Chloride: 107 meq/L (ref 96–112)
Creatinine, Ser: 1.3 mg/dL (ref 0.4–1.5)
Eosinophils Absolute: 0.3 10*3/uL (ref 0.0–0.7)
Eosinophils Relative: 4.3 % (ref 0.0–5.0)
GFR calc non Af Amer: 55.88 mL/min (ref >60)
Glucose, Bld: 117 mg/dL — ABNORMAL HIGH (ref 70–99)
HCT: 45.9 % (ref 39.0–52.0)
Hemoglobin: 15.8 g/dL (ref 13.0–17.0)
Lymphocytes Relative: 22.4 % (ref 12.0–46.0)
Lymphs Abs: 1.7 10*3/uL (ref 0.7–4.0)
MCHC: 34.3 g/dL (ref 30.0–36.0)
MCV: 89.8 fL (ref 78.0–100.0)
Monocytes Absolute: 0.7 10*3/uL (ref 0.1–1.0)
Monocytes Relative: 8.7 % (ref 3.0–12.0)
Neutro Abs: 5 10*3/uL (ref 1.4–7.7)
Neutrophils Relative %: 64.1 % (ref 43.0–77.0)
Platelets: 83 10*3/uL — ABNORMAL LOW (ref 150.0–400.0)
Potassium: 5.1 meq/L (ref 3.5–5.1)
RBC: 5.11 M/uL (ref 4.22–5.81)
RDW: 14 % (ref 11.5–14.6)
Sodium: 141 meq/L (ref 135–145)
TSH: 1.66 microintl units/mL (ref 0.35–5.50)
WBC: 7.8 10*3/uL (ref 4.5–10.5)

## 2009-11-16 ENCOUNTER — Ambulatory Visit: Payer: Self-pay | Admitting: Internal Medicine

## 2009-12-06 ENCOUNTER — Ambulatory Visit: Payer: Self-pay | Admitting: Cardiovascular Disease

## 2009-12-06 LAB — CONVERTED CEMR LAB: POC INR: 2

## 2009-12-21 ENCOUNTER — Ambulatory Visit: Payer: Self-pay | Admitting: Hematology & Oncology

## 2010-01-03 ENCOUNTER — Ambulatory Visit: Payer: Self-pay | Admitting: Cardiology

## 2010-01-03 LAB — CONVERTED CEMR LAB: POC INR: 2.4

## 2010-01-11 ENCOUNTER — Encounter: Payer: Self-pay | Admitting: Internal Medicine

## 2010-01-11 LAB — CBC WITH DIFFERENTIAL (CANCER CENTER ONLY)
BASO#: 0.2 10*3/uL (ref 0.0–0.2)
EOS%: 4.4 % (ref 0.0–7.0)
Eosinophils Absolute: 0.4 10*3/uL (ref 0.0–0.5)
HCT: 45.9 % (ref 38.7–49.9)
HGB: 15.7 g/dL (ref 13.0–17.1)
MCH: 31 pg (ref 28.0–33.4)
MCHC: 34.3 g/dL (ref 32.0–35.9)
MONO%: 6.2 % (ref 0.0–13.0)
NEUT%: 67 % (ref 40.0–80.0)

## 2010-01-13 LAB — PROTEIN ELECTROPHORESIS, SERUM
Albumin ELP: 56 % (ref 55.8–66.1)
Albumin ELP: 56.8 % (ref 55.8–66.1)
Alpha-2-Globulin: 9.7 % (ref 7.1–11.8)
Beta Globulin: 6.1 % (ref 4.7–7.2)
Beta Globulin: 6.3 % (ref 4.7–7.2)
Total Protein, Serum Electrophoresis: 7.2 g/dL (ref 6.0–8.3)
Total Protein, Serum Electrophoresis: 7.3 g/dL (ref 6.0–8.3)

## 2010-01-13 LAB — RETICULOCYTES (CHCC)
ABS Retic: 65.8 10*3/uL (ref 19.0–186.0)
Retic Ct Pct: 1.3 % (ref 0.4–3.1)

## 2010-01-13 LAB — VITAMIN B12: Vitamin B-12: 339 pg/mL (ref 211–911)

## 2010-01-31 ENCOUNTER — Ambulatory Visit: Payer: Self-pay | Admitting: Internal Medicine

## 2010-01-31 LAB — CONVERTED CEMR LAB: POC INR: 2.3

## 2010-02-09 ENCOUNTER — Ambulatory Visit: Payer: Self-pay | Admitting: Hematology & Oncology

## 2010-02-22 ENCOUNTER — Encounter: Payer: Self-pay | Admitting: Internal Medicine

## 2010-02-22 LAB — CBC WITH DIFFERENTIAL (CANCER CENTER ONLY)
BASO%: 1.6 % (ref 0.0–2.0)
EOS%: 4.4 % (ref 0.0–7.0)
HCT: 47.9 % (ref 38.7–49.9)
LYMPH#: 2.2 10*3/uL (ref 0.9–3.3)
MCHC: 34 g/dL (ref 32.0–35.9)
NEUT%: 63.3 % (ref 40.0–80.0)
RDW: 11.5 % (ref 10.5–14.6)

## 2010-02-28 ENCOUNTER — Ambulatory Visit: Payer: Self-pay | Admitting: Cardiology

## 2010-02-28 LAB — CONVERTED CEMR LAB: POC INR: 2.1

## 2010-03-17 ENCOUNTER — Telehealth: Payer: Self-pay | Admitting: Internal Medicine

## 2010-03-21 ENCOUNTER — Ambulatory Visit: Payer: Self-pay | Admitting: Internal Medicine

## 2010-03-21 DIAGNOSIS — M25569 Pain in unspecified knee: Secondary | ICD-10-CM

## 2010-03-21 DIAGNOSIS — K5909 Other constipation: Secondary | ICD-10-CM

## 2010-03-21 DIAGNOSIS — M109 Gout, unspecified: Secondary | ICD-10-CM | POA: Insufficient documentation

## 2010-03-28 ENCOUNTER — Ambulatory Visit: Payer: Self-pay | Admitting: Cardiovascular Disease

## 2010-03-28 LAB — CONVERTED CEMR LAB: POC INR: 2.4

## 2010-04-25 ENCOUNTER — Ambulatory Visit: Payer: Self-pay | Admitting: Cardiovascular Disease

## 2010-04-25 LAB — CONVERTED CEMR LAB: POC INR: 2.1

## 2010-05-04 ENCOUNTER — Telehealth: Payer: Self-pay | Admitting: Internal Medicine

## 2010-05-05 ENCOUNTER — Telehealth: Payer: Self-pay | Admitting: Internal Medicine

## 2010-05-23 ENCOUNTER — Ambulatory Visit: Payer: Self-pay | Admitting: Cardiology

## 2010-05-23 LAB — CONVERTED CEMR LAB: POC INR: 2.6

## 2010-05-24 ENCOUNTER — Ambulatory Visit: Payer: Self-pay | Admitting: Internal Medicine

## 2010-05-30 ENCOUNTER — Encounter: Payer: Self-pay | Admitting: Internal Medicine

## 2010-05-31 ENCOUNTER — Ambulatory Visit: Payer: Self-pay | Admitting: Hematology & Oncology

## 2010-06-07 ENCOUNTER — Encounter: Payer: Self-pay | Admitting: Internal Medicine

## 2010-06-07 LAB — CBC WITH DIFFERENTIAL (CANCER CENTER ONLY)
BASO#: 0.1 10e3/uL (ref 0.0–0.2)
BASO%: 0.6 % (ref 0.0–2.0)
EOS%: 5 % (ref 0.0–7.0)
Eosinophils Absolute: 0.4 10e3/uL (ref 0.0–0.5)
HCT: 45.9 % (ref 38.7–49.9)
HGB: 14.9 g/dL (ref 13.0–17.1)
LYMPH#: 1.7 10e3/uL (ref 0.9–3.3)
LYMPH%: 22.9 % (ref 14.0–48.0)
MCH: 29.8 pg (ref 28.0–33.4)
MCHC: 32.5 g/dL (ref 32.0–35.9)
MCV: 92 fL (ref 82–98)
MONO#: 0.6 10e3/uL (ref 0.1–0.9)
MONO%: 7.4 % (ref 0.0–13.0)
NEUT#: 4.9 10e3/uL (ref 1.5–6.5)
NEUT%: 64.1 % (ref 40.0–80.0)
Platelets: 92 10e3/uL — ABNORMAL LOW (ref 145–400)
RBC: 5.01 10e6/uL (ref 4.20–5.70)
RDW: 11.6 % (ref 10.5–14.6)
WBC: 7.6 10e3/uL (ref 4.0–10.0)

## 2010-06-07 LAB — CHCC SATELLITE - SMEAR

## 2010-06-20 ENCOUNTER — Ambulatory Visit: Payer: Self-pay | Admitting: Cardiovascular Disease

## 2010-06-20 LAB — CONVERTED CEMR LAB: POC INR: 2.1

## 2010-07-12 ENCOUNTER — Ambulatory Visit: Payer: Self-pay | Admitting: Internal Medicine

## 2010-07-18 ENCOUNTER — Ambulatory Visit: Payer: Self-pay | Admitting: Cardiology

## 2010-07-18 LAB — CONVERTED CEMR LAB: POC INR: 2.2

## 2010-07-19 LAB — CONVERTED CEMR LAB
BUN: 21 mg/dL (ref 6–23)
Basophils Absolute: 0 10*3/uL (ref 0.0–0.1)
Bilirubin, Direct: 0.2 mg/dL (ref 0.0–0.3)
Cholesterol: 137 mg/dL (ref 0–200)
Creatinine, Ser: 1.2 mg/dL (ref 0.4–1.5)
GFR calc non Af Amer: 64.27 mL/min (ref >60.00)
Glucose, Bld: 107 mg/dL — ABNORMAL HIGH (ref 70–99)
HCT: 45 % (ref 39.0–52.0)
HDL: 24.2 mg/dL — ABNORMAL LOW (ref >39.00)
Lymphs Abs: 1.8 10*3/uL (ref 0.7–4.0)
MCV: 92.3 fL (ref 78.0–100.0)
Monocytes Absolute: 0.8 10*3/uL (ref 0.1–1.0)
Monocytes Relative: 7.6 % (ref 3.0–12.0)
Neutrophils Relative %: 69.8 % (ref 43.0–77.0)
Platelets: 108 10*3/uL — ABNORMAL LOW (ref 150.0–400.0)
Potassium: 5.5 meq/L — ABNORMAL HIGH (ref 3.5–5.1)
RDW: 13.6 % (ref 11.5–14.6)
Specific Gravity, Urine: 1.025 (ref 1.000–1.030)
TSH: 1.52 microintl units/mL (ref 0.35–5.50)
Total Bilirubin: 1.3 mg/dL — ABNORMAL HIGH (ref 0.3–1.2)
Total Protein, Urine: NEGATIVE mg/dL
Urine Glucose: NEGATIVE mg/dL
VLDL: 22.6 mg/dL (ref 0.0–40.0)
pH: 5 (ref 5.0–8.0)

## 2010-07-28 ENCOUNTER — Ambulatory Visit: Payer: Self-pay | Admitting: Internal Medicine

## 2010-08-01 ENCOUNTER — Encounter (INDEPENDENT_AMBULATORY_CARE_PROVIDER_SITE_OTHER): Payer: Self-pay | Admitting: *Deleted

## 2010-08-12 DIAGNOSIS — I4891 Unspecified atrial fibrillation: Secondary | ICD-10-CM

## 2010-08-12 DIAGNOSIS — Z7901 Long term (current) use of anticoagulants: Secondary | ICD-10-CM

## 2010-08-15 ENCOUNTER — Ambulatory Visit: Admission: RE | Admit: 2010-08-15 | Discharge: 2010-08-15 | Payer: Self-pay | Source: Home / Self Care

## 2010-08-29 NOTE — Miscellaneous (Signed)
Summary: Flu 2011   Clinical Lists Changes  Observations: Added new observation of FLU VAX: Historical (05/18/2010 12:00)      Immunization History:  Influenza Immunization History:    Influenza:  historical (05/18/2010)

## 2010-08-29 NOTE — Assessment & Plan Note (Signed)
Summary: per check out/sf  Medications Added SPIRONOLACTONE 50 MG TABS (SPIRONOLACTONE) 1/2 tab once daily      Allergies Added: ! * [id number]  Primary Provider:  Sonda Primes, MD   History of Present Illness: Mr. Rodriques returns today for followup.  He is a pleasant 77 yo man with a h/o HTN, atrial fibrillation and syncope.  He has done well over the past few months.  His only complaint today is that he has had trouble with the circulation in his feet.  He denies claudication. No c/p or sob. No recurrent syncope.  Current Medications (verified): 1)  Metoprolol Succinate 50 Mg Tb24 (Metoprolol Succinate) .... 1/2 By Mouth Tid 2)  Pravastatin Sodium 40 Mg Tabs (Pravastatin Sodium) .Marland Kitchen.. 1p O Qd 3)  Warfarin Sodium 2.5 Mg  Tabs (Warfarin Sodium) .... Take As Directed By Anticoagulation Clinic 4)  Flomax 0.4 Mg  Cp24 (Tamsulosin Hcl) .Marland Kitchen.. 1 By Mouth At Bedtime 5)  Omeprazole 40 Mg Cpdr (Omeprazole) .... Once Daily 6)  Amlodipine Besylate 2.5 Mg Tabs (Amlodipine Besylate) .... Once Daily At Bedtime 7)  Vitamin D3 1000 Unit  Tabs (Cholecalciferol) .Marland Kitchen.. 1 By Mouth Daily 8)  Spironolactone 50 Mg Tabs (Spironolactone) .... 1/2 Tab Once Daily  Allergies (verified): 1)  ! * Pollysporin  Past History:  Past Medical History: Last updated: 08/17/2009 Colon cancer, hx of Colonic polyps, hx of Coronary artery disease Dr Ladona Ridgel Hyperlipidemia Hypertension Peripheral vascular disease - bilat carotid Transient ischemic attack, hx of x 2 Atrial fibrillation chronic coumadin Myocardial infarction, hx of Diverticulitis, hx of Benign prostatic hypertrophy Dr Jabier Gauss deficiency Peripheral vascular disease - known small AAA hx of LGI bleed - diverticular Gout Skin cancer, hx of L hand 2010 Low PLTs 2010  Past Surgical History: Last updated: 10/17/2007 left hip fx/s/p left hip surgury Coronary artery bypass graft - 1995 s/p ascending colectomy - laparoscopic 2005 hx of  pneumothorax spontaneous 1995 Cataract extraction  Review of Systems  The patient denies chest pain, syncope, dyspnea on exertion, and peripheral edema.    Vital Signs:  Patient profile:   77 year old male Height:      72 inches Weight:      211 pounds Pulse rate:   75 / minute BP sitting:   112 / 70  (left arm)  Vitals Entered By: Laurance Flatten CMA (October 26, 2009 9:51 AM)  Physical Exam  General:  elderly, well developed, well nourished, in no acute distress.  HEENT: normal Neck: supple. No JVD. Carotids 2+ bilaterally no bruits Cor: IRIRR no rubs, gallops or murmur Lungs: CTA Ab: soft, nontender. nondistended. No HSM. Good bowel sounds Ext: warm. no cyanosis, clubbing or edema Neuro: alert and oriented. Grossly nonfocal. affect pleasant    EKG  Procedure date:  10/26/2009  Findings:      Atrial fibrillation with a controlled ventricular response rate of:   Impression & Recommendations:  Problem # 1:  ATRIAL FIBRILLATION (ICD-427.31) He is now chronically in atrial fibrillation and his rate is well controlled.  He will continue on his current meds. His updated medication list for this problem includes:    Metoprolol Succinate 50 Mg Tb24 (Metoprolol succinate) .Marland Kitchen... 1/2 by mouth tid    Warfarin Sodium 2.5 Mg Tabs (Warfarin sodium) .Marland Kitchen... Take as directed by anticoagulation clinic  Problem # 2:  HYPERTENSION (ICD-401.9) His blood pressure has been well controlled.  He will continue his current meds. His updated medication list for this problem includes:  Metoprolol Succinate 50 Mg Tb24 (Metoprolol succinate) .Marland Kitchen... 1/2 by mouth tid    Amlodipine Besylate 2.5 Mg Tabs (Amlodipine besylate) ..... Once daily at bedtime    Spironolactone 50 Mg Tabs (Spironolactone) .Marland Kitchen... 1/2 tab once daily  Problem # 3:  MICTURITION SYNCOPE (ICD-788.69) He has had no additional episodes. Will follow. His updated medication list for this problem includes:    Metoprolol Succinate 50 Mg  Tb24 (Metoprolol succinate) .Marland Kitchen... 1/2 by mouth tid    Warfarin Sodium 2.5 Mg Tabs (Warfarin sodium) .Marland Kitchen... Take as directed by anticoagulation clinic    Amlodipine Besylate 2.5 Mg Tabs (Amlodipine besylate) ..... Once daily at bedtime  Patient Instructions: 1)  Your physician recommends that you schedule a follow-up appointment in: 6 months with Dr Ladona Ridgel

## 2010-08-29 NOTE — Medication Information (Signed)
Summary: rov/tm  Anticoagulant Therapy  Managed by: Bethena Midget, RN, BSN PCP: Sonda Primes, MD Supervising MD: Graciela Husbands MD, Viviann Spare Indication 1: Atrial Fibrillation Lab Used: [facility] [place] Site: [address] INR POC 2.3 INR RANGE 2.0-3.0  Dietary changes: no    Health status changes: no    Bleeding/hemorrhagic complications: no    Recent/future hospitalizations: no    Any changes in medication regimen? no    Recent/future dental: no  Any missed doses?: no       Is patient compliant with meds? yes       Allergies: 1)  ! * Pollysporin  Anticoagulation Management History:      The patient is taking warfarin and comes in today for a routine follow up visit.  Positive risk factors for bleeding include an age of 75 years or older.  The bleeding index is 'intermediate risk'.  Positive CHADS2 values include History of HTN and Age > 81 years old.  His last INR was 6.0 RATIO.  Anticoagulation responsible provider: Graciela Husbands MD, Viviann Spare.  INR POC: 2.3.  Cuvette Lot#: [id number].  Exp: 02/2011.    Anticoagulation Management Assessment/Plan:      The patient's current anticoagulation dose is Warfarin sodium 2.5 mg  tabs: Take as directed by anticoagulation clinic.  The target INR is 2 - 3.  The next INR is due 02/28/2010.  Anticoagulation instructions were given to patient.  Results were reviewed/authorized by Bethena Midget, RN, BSN.  He was notified by Bethena Midget, RN, BSN.         Prior Anticoagulation Instructions: INR 2.4 Continue 2.5mg s everyday except 1.25mg s on Fridays. Recheck in 4 weeks.   Current Anticoagulation Instructions: INR 2.3 Continue 2.5mg s everyday except 1.25mg  on Fridays. Recheck in 4 weeks.

## 2010-08-29 NOTE — Medication Information (Signed)
Summary: rov/tm  Anticoagulant Therapy  Managed by: Bethena Midget, RN, BSN PCP: Sonda Primes, MD Supervising MD: Juanda Chance MD, Bruce Indication 1: Atrial Fibrillation Lab Used: [facility] [place] Site: [address] INR POC 2.1 INR RANGE 2.0-3.0  Dietary changes: no    Health status changes: no    Bleeding/hemorrhagic complications: no    Recent/future hospitalizations: no    Any changes in medication regimen? no    Recent/future dental: no  Any missed doses?: no       Is patient compliant with meds? yes       Allergies: 1)  ! * Pollysporin  Anticoagulation Management History:      The patient is taking warfarin and comes in today for a routine follow up visit.  Positive risk factors for bleeding include an age of 75 years or older.  The bleeding index is 'intermediate risk'.  Positive CHADS2 values include History of HTN and Age > 39 years old.  His last INR was 6.0 RATIO.  Anticoagulation responsible [place]x [place]: Juanda Chance MD, Smitty Cords.  INR POC: 2.1.  Cuvette Lot#: [id number].  Exp: 04/2011.    Anticoagulation Management Assessment/Plan:      The patient's current anticoagulation dose is Warfarin sodium 2.5 mg  tabs: Take as directed by anticoagulation clinic.  The target INR is 2 - 3.  The next INR is due 03/28/2010.  Anticoagulation instructions were given to patient.  Results were reviewed/authorized by Bethena Midget, RN, BSN.  He was notified by Bethena Midget, RN, BSN.         Prior Anticoagulation Instructions: INR 2.3 Continue 2.5mg s everyday except 1.25mg  on Fridays. Recheck in 4 weeks.   Current Anticoagulation Instructions: INR 2.1 Continue 2.5mg s everyday except 1.25mg s on Fridays. Recheck in 4 weeks.

## 2010-08-29 NOTE — Assessment & Plan Note (Signed)
Summary: 3 MO ROV /NWS  #   Vital Signs:  Patient profile:   77 year old male Height:      72 inches Weight:      212.75 pounds O2 Sat:      98 % on Room air Temp:     97.6 degrees F oral Pulse rate:   70 / minute BP sitting:   128 / 72  (left arm) Cuff size:   regular  Vitals Entered By: Lucious Groves (November 16, 2009 11:13 AM)  O2 Flow:  Room air CC: 3 mo rtn ov./kb Is Patient Diabetic? No Pain Assessment Patient in pain? no        Primary Care Provider:  Sonda Primes, MD  CC:  3 mo rtn ov./kb.  History of Present Illness: The patient presents for a follow up of hypertension, thrombocytopenia, hyperlipidemia.   Current Medications (verified): 1)  Metoprolol Succinate 50 Mg Tb24 (Metoprolol Succinate) .... 1/2 By Mouth Tid 2)  Pravastatin Sodium 40 Mg Tabs (Pravastatin Sodium) .Marland Kitchen.. 1p O Qd 3)  Warfarin Sodium 2.5 Mg  Tabs (Warfarin Sodium) .... Take As Directed By Anticoagulation Clinic 4)  Flomax 0.4 Mg  Cp24 (Tamsulosin Hcl) .Marland Kitchen.. 1 By Mouth At Bedtime 5)  Omeprazole 40 Mg Cpdr (Omeprazole) .... Once Daily 6)  Amlodipine Besylate 2.5 Mg Tabs (Amlodipine Besylate) .... Once Daily At Bedtime 7)  Vitamin D3 1000 Unit  Tabs (Cholecalciferol) .Marland Kitchen.. 1 By Mouth Daily 8)  Spironolactone 50 Mg Tabs (Spironolactone) .... 1/2 Tab Once Daily  Allergies (verified): 1)  ! * Pollysporin  Past History:  Past Medical History: Last updated: 08/17/2009 Colon cancer, hx of Colonic polyps, hx of Coronary artery disease Dr Ladona Ridgel Hyperlipidemia Hypertension Peripheral vascular disease - bilat carotid Transient ischemic attack, hx of x 2 Atrial fibrillation chronic coumadin Myocardial infarction, hx of Diverticulitis, hx of Benign prostatic hypertrophy Dr Jabier Gauss deficiency Peripheral vascular disease - known small AAA hx of LGI bleed - diverticular Gout Skin cancer, hx of L hand 2010 Low PLTs 2010  Social History: Last updated: 08/24/2008 retired group  insurance Married 2 children Former Smoker Alcohol use-yes wife with Parkinson's  Review of Systems  The patient denies fever, weight loss, weight gain, abdominal pain, melena, hematochezia, severe indigestion/heartburn, suspicious skin lesions, depression, unusual weight change, enlarged lymph nodes, and angioedema.    Physical Exam  General:  overweight-appearing.   Nose:  External nasal examination shows no deformity or inflammation. Nasal mucosa are pink and moist without lesions or exudates. Mouth:  Oral mucosa and oropharynx without lesions or exudates.  Teeth in good repair. Lungs:  Normal respiratory effort, chest expands symmetrically. Lungs are clear to auscultation, no crackles or wheezes. Heart:  Irreg irreg Abdomen:  S/NT Msk:  L arm in a sling Extremities:  trace left pedal edema and trace right pedal edema.   Neurologic:  No cranial nerve deficits noted. Station and gait are normal. Plantar reflexes are down-going bilaterally. DTRs are symmetrical throughout. Sensory, motor and coordinative functions appear intact. Skin:  Clear Psych:  Cognition and judgment appear intact. Alert and cooperative with normal attention span and concentration. No apparent delusions, illusions, hallucinations   Impression & Recommendations:  Problem # 1:  THROMBOCYTOPENIA (ICD-287.5) likely idiopathic. Assessment Unchanged I'm worried about his thrombocytopenia in combination with anticoagulation. No bleeding problems at present. Orders: Hematology Referral (Hematology) The labs were reviewed with the patient.   Problem # 2:  EDEMA (ICD-782.3) Assessment: Improved  His updated medication list for  this problem includes:    Spironolactone 50 Mg Tabs (Spironolactone) .Marland Kitchen... 1/2 tab once daily  Problem # 3:  ATRIAL FIBRILLATION (ICD-427.31) Assessment: Unchanged  His updated medication list for this problem includes:    Metoprolol Succinate 50 Mg Tb24 (Metoprolol succinate) .Marland Kitchen... 1/2  by mouth tid    Warfarin Sodium 2.5 Mg Tabs (Warfarin sodium) .Marland Kitchen... Take as directed by anticoagulation clinic    Amlodipine Besylate 2.5 Mg Tabs (Amlodipine besylate) ..... Once daily at bedtime  Reviewed the following: PT: 20.2 (02/02/2009)   INR: 6.0 RATIO (12/25/2006) Coumadin Dose (weekly): 16.25 mg (11/08/2009) Prior Coumadin Dose (weekly): 16.25 mg (11/08/2009) Next Protime: 12/06/2009 (dated on 11/08/2009)  Problem # 4:  HYPERLIPIDEMIA (ICD-272.4) Assessment: Unchanged  His updated medication list for this problem includes:    Pravastatin Sodium 40 Mg Tabs (Pravastatin sodium) .Marland Kitchen... 1p o qd  Complete Medication List: 1)  Metoprolol Succinate 50 Mg Tb24 (Metoprolol succinate) .... 1/2 by mouth tid 2)  Pravastatin Sodium 40 Mg Tabs (Pravastatin sodium) .Marland Kitchen.. 1p o qd 3)  Warfarin Sodium 2.5 Mg Tabs (Warfarin sodium) .... Take as directed by anticoagulation clinic 4)  Flomax 0.4 Mg Cp24 (Tamsulosin hcl) .Marland Kitchen.. 1 by mouth at bedtime 5)  Omeprazole 40 Mg Cpdr (Omeprazole) .... Once daily 6)  Amlodipine Besylate 2.5 Mg Tabs (Amlodipine besylate) .... Once daily at bedtime 7)  Vitamin D3 1000 Unit Tabs (Cholecalciferol) .Marland Kitchen.. 1 by mouth daily 8)  Spironolactone 50 Mg Tabs (Spironolactone) .... 1/2 tab once daily  Patient Instructions: 1)  Please schedule a follow-up appointment in 4 months. 2)  BMP prior to visit, ICD-9: 3)  CBC w/ Diff prior to visit, ICD-9:995.20

## 2010-08-29 NOTE — Letter (Signed)
[facility]   Imported By: Sherian Rein 02/06/2010 14:11:29  _____________________________________________________________________  External Attachment:    Type:   Image     Comment:   External Document

## 2010-08-29 NOTE — Medication Information (Signed)
Summary: rov/sp  Anticoagulant Therapy  Managed by: Weston Brass, PharmD PCP: Sonda Primes, MD Supervising MD: Eden Emms MD, Theron Arista Indication 1: Atrial Fibrillation Lab Used: [facility] [place] Site: [address] INR POC 2.1 INR RANGE 2.0-3.0  Dietary changes: no    Health status changes: no    Bleeding/hemorrhagic complications: no    Recent/future hospitalizations: no    Any changes in medication regimen? no    Recent/future dental: no  Any missed doses?: no       Is patient compliant with meds? yes       Allergies: 1)  ! * Pollysporin  Anticoagulation Management History:      The patient is taking warfarin and comes in today for a routine follow up visit.  Positive risk factors for bleeding include an age of 60 years or older.  The bleeding index is 'intermediate risk'.  Positive CHADS2 values include History of HTN and Age > 23 years old.  His last INR was 6.0 RATIO.  Anticoagulation responsible provider: Eden Emms MD, Theron Arista.  INR POC: 2.1.  Cuvette Lot#: [id number].  Exp: 05/2011.    Anticoagulation Management Assessment/Plan:      The patient's current anticoagulation dose is Warfarin sodium 2.5 mg  tabs: Take as directed by anticoagulation clinic.  The target INR is 2 - 3.  The next INR is due 05/23/2010.  Anticoagulation instructions were given to patient.  Results were reviewed/authorized by Weston Brass, PharmD.  He was notified by Harrel Carina, PharmD candidate.         Prior Anticoagulation Instructions: INR 2.4  Continue 1 tablet daily except 1/2 tablet on Fridays.  Return to clinic in 4 weeks.  Current Anticoagulation Instructions: INR 2.1  Continue taking 1 tablet everyday except take 1/2 tablet on Fridays. Re-check INR in 4 weeks.

## 2010-08-29 NOTE — Progress Notes (Signed)
Summary: med clarification  Phone Note Call from Patient Call back at Home Phone [phone]   Caller: Patient Summary of Call: Patient called [facility] requesting clarification on Spironolactone dosage. He stated that he has been recieving large, white, scored pills from [facility] that he thought was 25mg  tab (been taking 1/2 tab). He has  received another rx from them but pills look different. He is requesting a call back to verify what dosage is should be taking 12.5 on 25mg ..Marland KitchenMarland KitchenAlvy Beal Archie CMA  May 04, 2010 10:41 AM   Follow-up for Phone Call        Pt advised via VM in detail that he is Rxd 50mg  1/2 tab once daily. I advised that [facility] may have started to mail out 25mg  tab to take 1 once daily instead of breaking tablet. Pt was also advised to contact [facility] to verify this and call back if necessary. Follow-up by: Margaret Pyle, CMA,  May 04, 2010 12:41 PM

## 2010-08-29 NOTE — Assessment & Plan Note (Signed)
Summary: 4 MO ROV /NWS  #   Vital Signs:  Patient profile:   77 year old [name] Height:      72 inches Weight:      216 pounds BMI:     29.40 O2 Sat:      96 % on Room air Temp:     98.5 degrees F oral Pulse rate:   81 / minute Pulse rhythm:   regular Resp:     16 per minute BP sitting:   110 / 72  (left arm) Cuff size:   regular  Vitals Entered By: Lanier Prude, CMA(AAMA) (March 21, 2010 11:05 AM)  O2 Flow:  Room air CC: 4 mo f/u Is Patient Diabetic? No   Primary Care John [name]:  John Primes, MD  CC:  4 mo f/u.  History of Present Illness: The patient presents for a follow up of hypertension, CAD, hyperlipidemia C/o gout in R knee, ankle C/o constipation   Current Medications (verified): 1)  Metoprolol Succinate John Mg Tb24 (Metoprolol Succinate) .... 1/2 By Mouth Tid 2)  Pravastatin Sodium 40 Mg Tabs (Pravastatin Sodium) .Marland Kitchen.. 1p O Qd 3)  Warfarin Sodium 2.5 Mg  Tabs (Warfarin Sodium) .... Take As Directed By Anticoagulation Clinic 4)  Flomax 0.4 Mg  Cp24 (Tamsulosin Hcl) .Marland Kitchen.. 1 By Mouth At Bedtime 5)  Omeprazole 40 Mg Cpdr (Omeprazole) .... Once Daily 6)  Amlodipine Besylate 2.5 Mg Tabs (Amlodipine Besylate) .... Once Daily At Bedtime 7)  Vitamin D3 1000 Unit  Tabs (Cholecalciferol) .Marland Kitchen.. 1 By Mouth Daily 8)  Spironolactone John Mg Tabs (Spironolactone) .... 1/2 Tab Once Daily 9)  Icaps Mv  Tabs (Multiple Vitamins-Minerals) .... Take 1 Tablet Daily  Allergies (verified): 1)  ! * Pollysporin  Past History:  Past Medical History: Last updated: 08/17/2009 Colon cancer, hx of Colonic polyps, hx of Coronary artery disease Dr Ladona Ridgel Hyperlipidemia Hypertension Peripheral vascular disease - bilat carotid Transient ischemic attack, hx of x 2 Atrial fibrillation chronic coumadin Myocardial infarction, hx of Diverticulitis, hx of Benign prostatic hypertrophy Dr Jabier Gauss deficiency Peripheral vascular disease - known small AAA hx of LGI bleed -  diverticular Gout Skin cancer, hx of L hand 2010 Low PLTs 2010  Past Surgical History: Last updated: 10/17/2007 left hip fx/s/p left hip surgury Coronary artery bypass graft - 1995 s/p ascending colectomy - laparoscopic 2005 hx of pneumothorax spontaneous 1995 Cataract extraction  Social History: Last updated: 08/24/2008 retired group insurance Married 2 children Former Smoker Alcohol use-yes wife with Parkinson's  Family History: Reviewed history from 07/24/2008 and no changes required. mother with breast cancer heart disease HTN father with stroke uncle died of stroke in 22s   Social History: Reviewed history from 08/24/2008 and no changes required. retired [employer] Married 2 children Former Smoker Alcohol use-yes wife with Parkinson's  Review of Systems  The patient denies weight loss, dyspnea on exertion, abdominal pain, and melena.    Physical Exam  General:  overweight-appearing.   Nose:  External nasal examination shows no deformity or inflammation. Nasal mucosa are pink and moist without lesions or exudates. Mouth:  Oral mucosa and oropharynx without lesions or exudates.  Teeth in good repair. Lungs:  Normal respiratory effort, chest expands symmetrically. Lungs are clear to auscultation, no crackles or wheezes. Heart:  Irreg irreg Abdomen:  S/NT Msk:  L arm in a sling Extremities:  trace left pedal edema and trace right pedal edema.   Neurologic:  No cranial nerve deficits noted. Station and gait are  normal. Plantar reflexes are down-going bilaterally. DTRs are symmetrical throughout. Sensory, motor and coordinative functions appear intact. Skin:  Clear Psych:  Cognition and judgment appear intact. Alert and cooperative with normal attention span and concentration. No apparent delusions, illusions, hallucinations   Impression & Recommendations:  Problem # 1:  CORONARY ARTERY DISEASE (ICD-414.00) Assessment Unchanged  His updated  medication list for this problem includes:    Metoprolol Succinate John Mg Tb24 (Metoprolol succinate) .Marland Kitchen... 1/2 by mouth tid    Amlodipine Besylate 2.5 Mg Tabs (Amlodipine besylate) ..... Once daily at bedtime    Spironolactone John Mg Tabs (Spironolactone) .Marland Kitchen... 1/2 tab once daily  Problem # 2:  GOUT (ICD-274.9) R knee vs OA Assessment: New  Problem # 3:  KNEE PAIN (ICD-719.46) R Assessment: New  His updated medication list for this problem includes:    Ibuprofen 600 Mg Tabs (Ibuprofen) .Marland Kitchen... 1 by mouth bid  pc x 1 wk then as needed for  pain/gout  Problem # 4:  CONSTIPATION, CHRONIC (ICD-564.09) Assessment: Unchanged  His updated medication list for this problem includes:    Miralax Powd (Polyethylene glycol 3350) .Marland Kitchen... 1 scoop once daily prn  Complete Medication List: 1)  Metoprolol Succinate John Mg Tb24 (Metoprolol succinate) .... 1/2 by mouth tid 2)  Pravastatin Sodium 40 Mg Tabs (Pravastatin sodium) .Marland Kitchen.. 1p o qd 3)  Warfarin Sodium 2.5 Mg Tabs (Warfarin sodium) .... Take as directed by anticoagulation clinic 4)  Flomax 0.4 Mg Cp24 (Tamsulosin hcl) .Marland Kitchen.. 1 by mouth at bedtime 5)  Omeprazole 40 Mg Cpdr (Omeprazole) .... Once daily 6)  Amlodipine Besylate 2.5 Mg Tabs (Amlodipine besylate) .... Once daily at bedtime 7)  Vitamin D3 1000 Unit Tabs (Cholecalciferol) .Marland Kitchen.. 1 by mouth daily 8)  Spironolactone John Mg Tabs (Spironolactone) .... 1/2 tab once daily 9)  Icaps Mv Tabs (Multiple vitamins-minerals) .... Take 1 tablet daily 10)  Ibuprofen 600 Mg Tabs (Ibuprofen) .Marland Kitchen.. 1 by mouth bid  pc x 1 wk then as needed for  pain/gout 11)  Miralax Powd (Polyethylene glycol 3350) .Marland Kitchen.. 1 scoop once daily prn  Patient Instructions: 1)  Please schedule a follow-up appointment in 4 months well w/labs and uric acid 729.5. Prescriptions: MIRALAX  POWD (POLYETHYLENE GLYCOL 3350) 1 scoop once daily prn  #1 x 6   Entered and Authorized by:   John Garter MD   Signed by:   John Garter MD  on 03/21/2010   Method used:   Print then Give to Patient   RxID:   [id number] IBUPROFEN 600 MG TABS (IBUPROFEN) 1 by mouth bid  pc x 1 wk then as needed for  pain/gout  #60 x 0   Entered and Authorized by:   John Garter MD   Signed by:   John Garter MD on 03/21/2010   Method used:   Print then Give to Patient   RxID:   [id number]

## 2010-08-29 NOTE — Medication Information (Signed)
Summary: rov/sp  Anticoagulant Therapy  Managed by: Bethena Midget, RN, BSN PCP: Sonda Primes, MD Supervising MD: Clifton Ryan MD, Cristal Deer Indication 1: Atrial Fibrillation Lab Used: [facility] [place] Site: [address] INR POC 2.1 INR RANGE 2.0-3.0  Dietary changes: no    Health status changes: no    Bleeding/hemorrhagic complications: no    Recent/future hospitalizations: no    Any changes in medication regimen? no    Recent/future dental: no  Any missed doses?: no       Is patient compliant with meds? yes       Allergies: 1)  ! * Pollysporin  Anticoagulation Management History:      The patient is taking warfarin and comes in today for a routine follow up visit.  Positive risk factors for bleeding include an age of 75 years or older.  The bleeding index is 'intermediate risk'.  Positive CHADS2 values include History of HTN and Age > 6 years old.  His last INR was 6.0 RATIO.  Anticoagulation responsible provider: Clifton Adalid MD, Cristal Deer.  INR POC: 2.1.  Cuvette Lot#: [id number].  Exp: 06/2011.    Anticoagulation Management Assessment/Plan:      The patient's current anticoagulation dose is Warfarin sodium 2.5 mg  tabs: Take as directed by anticoagulation clinic.  The target INR is 2 - 3.  The next INR is due 07/18/2010.  Anticoagulation instructions were given to patient.  Results were reviewed/authorized by Bethena Midget, RN, BSN.  He was notified by Bethena Midget, RN, BSN.         Prior Anticoagulation Instructions: INR 2.6  Continue same dose of 1 tablet every day except 1/2 tablet on Friday.  Recheck INR in 4 weeks.   Current Anticoagulation Instructions: INR 2.1 Continue 1 pill daily except 1/2 pill on Fridays. Recheck in 4 weeks.

## 2010-08-29 NOTE — Medication Information (Signed)
Summary: rov/tm  Anticoagulant Therapy  Managed by: Weston Brass, PharmD PCP: Sonda Primes, MD Supervising MD: Eden Emms MD, Theron Arista Indication 1: Atrial Fibrillation Lab Used: [facility] [place] Site: [address] INR POC 2.4 INR RANGE 2.0-3.0  Dietary changes: no    Health status changes: no    Bleeding/hemorrhagic complications: no    Recent/future hospitalizations: no    Any changes in medication regimen? no    Recent/future dental: no  Any missed doses?: no       Is patient compliant with meds? yes       Allergies: 1)  ! * Pollysporin  Anticoagulation Management History:      The patient is taking warfarin and comes in today for a routine follow up visit.  Positive risk factors for bleeding include an age of 75 years or older.  The bleeding index is 'intermediate risk'.  Positive CHADS2 values include History of HTN and Age > 75 years old.  His last INR was 6.0 RATIO.  Anticoagulation responsible provider: Eden Emms MD, Theron Arista.  INR POC: 2.4.  Cuvette Lot#: [id number].  Exp: 04/2011.    Anticoagulation Management Assessment/Plan:      The patient's current anticoagulation dose is Warfarin sodium 2.5 mg  tabs: Take as directed by anticoagulation clinic.  The target INR is 2 - 3.  The next INR is due 04/25/2010.  Anticoagulation instructions were given to patient.  Results were reviewed/authorized by Weston Brass, PharmD.  He was notified by Liana Gerold, PharmD Candidate.         Prior Anticoagulation Instructions: INR 2.1 Continue 2.5mg s everyday except 1.25mg s on Fridays. Recheck in 4 weeks.   Current Anticoagulation Instructions: INR 2.4  Continue 1 tablet daily except 1/2 tablet on Fridays.  Return to clinic in 4 weeks.

## 2010-08-29 NOTE — Medication Information (Signed)
Summary: rov couamdin - [facility]  Anticoagulant Therapy  Managed by: Bethena Midget, RN, BSN PCP: Sonda Primes, MD Supervising MD: Myrtis Ser MD, Tinnie Gens Indication 1: Atrial Fibrillation Lab Used: [facility] [place] Site: [address] INR POC 2.1 INR RANGE 2.0-3.0  Dietary changes: yes       Details: Possible more leafy veggies  Health status changes: no    Bleeding/hemorrhagic complications: no    Recent/future hospitalizations: no    Any changes in medication regimen? no    Recent/future dental: no  Any missed doses?: no       Is patient compliant with meds? yes       Allergies (verified): No Known Drug Allergies  Anticoagulation Management History:      The patient is taking warfarin and comes in today for a routine follow up visit.  Positive risk factors for bleeding include an age of 35 years or older.  The bleeding index is 'intermediate risk'.  Positive CHADS2 values include History of HTN and Age > 4 years old.  His last INR was 6.0 RATIO.  Anticoagulation responsible provider: Myrtis Ser MD, Tinnie Gens.  INR POC: 2.1.  Cuvette Lot#: [id number].  Exp: 10/2010.    Anticoagulation Management Assessment/Plan:      The patient's current anticoagulation dose is Warfarin sodium 2.5 mg  tabs: Take as directed by anticoagulation clinic.  The target INR is 2 - 3.  The next INR is due 09/13/2009.  Anticoagulation instructions were given to patient.  Results were reviewed/authorized by Bethena Midget, RN, BSN.  He was notified by Bethena Midget, RN, BSN.         Prior Anticoagulation Instructions: INR 2.4  Continue Coumadin 1 tab = 2.5mg  each day except 1/2 tab on Fr  Current Anticoagulation Instructions: INR 2.1 Continue 2.5mg s daily except 1.25mg s on Fridays. Recheck in 4 weeks.

## 2010-08-29 NOTE — Medication Information (Signed)
Summary: rov/tm  Anticoagulant Therapy  Managed by: Bethena Midget, RN, BSN PCP: Sonda Primes, MD Supervising MD: Myrtis Ser MD, Tinnie Gens Indication 1: Atrial Fibrillation Lab Used: [facility] [place] Site: [address] INR POC 2.1 INR RANGE 2.0-3.0  Dietary changes: no    Health status changes: no    Bleeding/hemorrhagic complications: no    Recent/future hospitalizations: no    Any changes in medication regimen? no    Recent/future dental: no  Any missed doses?: no       Is patient compliant with meds? yes       Allergies: No Known Drug Allergies  Anticoagulation Management History:      The patient is taking warfarin and comes in today for a routine follow up visit.  Positive risk factors for bleeding include an age of 75 years or older.  The bleeding index is 'intermediate risk'.  Positive CHADS2 values include History of HTN and Age > 62 years old.  His last INR was 6.0 RATIO.  Anticoagulation responsible provider: Myrtis Ser MD, Tinnie Gens.  INR POC: 2.1.  Cuvette Lot#: [id number].  Exp: 11/2010.    Anticoagulation Management Assessment/Plan:      The patient's current anticoagulation dose is Warfarin sodium 2.5 mg  tabs: Take as directed by anticoagulation clinic.  The target INR is 2 - 3.  The next INR is due 11/08/2009.  Anticoagulation instructions were given to patient.  Results were reviewed/authorized by Bethena Midget, RN, BSN.  He was notified by Bethena Midget, RN, BSN.         Prior Anticoagulation Instructions: INR 2.2 Continue 2.5mg s everyday except 1.25mg  on Fridays. Recheck in 4 weeks.   Current Anticoagulation Instructions: INR 2.1 Continue 2.5mg s daily except 1.25mg s on Fridays. Recheck in 4 weeks.

## 2010-08-29 NOTE — Medication Information (Signed)
Summary: [name]   Anticoagulant Therapy  Managed by: Weston Brass, PharmD PCP: Sonda Primes, MD Supervising MD: Shirlee Latch MD, Dalton Indication 1: Atrial Fibrillation Lab Used: [facility] [place] Site: [address] INR POC 2.6 INR RANGE 2.0-3.0  Dietary changes: no    Health status changes: no    Bleeding/hemorrhagic complications: no    Recent/future hospitalizations: no    Any changes in medication regimen? no    Recent/future dental: no  Any missed doses?: no       Is patient compliant with meds? yes       Allergies: 1)  ! * Pollysporin  Anticoagulation Management History:      The patient is taking warfarin and comes in today for a routine follow up visit.  Positive risk factors for bleeding include an age of 75 years or older.  The bleeding index is 'intermediate risk'.  Positive CHADS2 values include History of HTN and Age > 80 years old.  His last INR was 6.0 RATIO.  Anticoagulation responsible provider: Shirlee Latch MD, Dalton.  INR POC: 2.6.  Cuvette Lot#: [id number].  Exp: 06/2011.    Anticoagulation Management Assessment/Plan:      The patient's current anticoagulation dose is Warfarin sodium 2.5 mg  tabs: Take as directed by anticoagulation clinic.  The target INR is 2 - 3.  The next INR is due 06/20/2010.  Anticoagulation instructions were given to patient.  Results were reviewed/authorized by Weston Brass, PharmD.  He was notified by Weston Brass PharmD.         Prior Anticoagulation Instructions: INR 2.1  Continue taking 1 tablet everyday except take 1/2 tablet on Fridays. Re-check INR in 4 weeks.   Current Anticoagulation Instructions: INR 2.6  Continue same dose of 1 tablet every day except 1/2 tablet on Friday.  Recheck INR in 4 weeks.

## 2010-08-29 NOTE — Assessment & Plan Note (Signed)
Summary: 6 month rov.sl    Primary Provider:  Sonda Primes, MD  CC:  check up.  History of Present Illness: Mr. Roddey returns today for followup.  He is a pleasant 77 yo man with a h/o HTN, atrial fibrillation and syncope.  He has done well over the past few months.  He denies claudication. No c/p or sob. No recurrent syncope. He has not had palpitations and cannot tell that he is out of rhythm.  Current Medications (verified): 1)  Metoprolol Succinate 50 Mg Tb24 (Metoprolol Succinate) .... 1/2 By Mouth Tid 2)  Pravastatin Sodium 40 Mg Tabs (Pravastatin Sodium) .Marland Kitchen.. 1p O Qd 3)  Warfarin Sodium 2.5 Mg  Tabs (Warfarin Sodium) .... Take As Directed By Anticoagulation Clinic 4)  Flomax 0.4 Mg  Cp24 (Tamsulosin Hcl) .Marland Kitchen.. 1 By Mouth At Bedtime 5)  Omeprazole 40 Mg Cpdr (Omeprazole) .... Once Daily 6)  Amlodipine Besylate 2.5 Mg Tabs (Amlodipine Besylate) .... Once Daily At Bedtime 7)  Vitamin D3 1000 Unit  Tabs (Cholecalciferol) .Marland Kitchen.. 1 By Mouth Daily 8)  Spironolactone 25 Mg Tabs (Spironolactone) .Marland Kitchen.. 1 Once Daily 9)  Icaps Mv  Tabs (Multiple Vitamins-Minerals) .... Take 1 Tablet Daily 10)  Ibuprofen 600 Mg Tabs (Ibuprofen) .Marland Kitchen.. 1 By Mouth Bid  Pc X 1 Wk Then As Needed For  Pain/gout 11)  Miralax  Powd (Polyethylene Glycol 3350) .Marland Kitchen.. 1 Scoop Once Daily Prn  Allergies: 1)  ! * Pollysporin  Past History:  Past Medical History: Last updated: 08/17/2009 Colon cancer, hx of Colonic polyps, hx of Coronary artery disease Dr Ladona Ridgel Hyperlipidemia Hypertension Peripheral vascular disease - bilat carotid Transient ischemic attack, hx of x 2 Atrial fibrillation chronic coumadin Myocardial infarction, hx of Diverticulitis, hx of Benign prostatic hypertrophy Dr Jabier Gauss deficiency Peripheral vascular disease - known small AAA hx of LGI bleed - diverticular Gout Skin cancer, hx of L hand 2010 Low PLTs 2010  Past Surgical History: Last updated: 10/17/2007 left hip fx/s/p  left hip surgury Coronary artery bypass graft - 1995 s/p ascending colectomy - laparoscopic 2005 hx of pneumothorax spontaneous 1995 Cataract extraction  Review of Systems  The patient denies chest pain, syncope, dyspnea on exertion, and peripheral edema.    Vital Signs:  Patient profile:   77 year old male Height:      72 inches Weight:      218 pounds BMI:     29.67 Pulse rate:   74 / minute Resp:     14 per minute BP sitting:   128 / 72  (left arm)  Vitals Entered By: Kem Parkinson (May 24, 2010 10:24 AM)  Physical Exam  General:  overweight-appearing.   Head:  normocephalic and atraumatic Eyes:  PERRLA/EOM intact; conjunctiva and lids normal. Mouth:  Oral mucosa and oropharynx without lesions or exudates.  Teeth in good repair. Neck:  No deformities, masses, or tenderness noted. Lungs:  Normal respiratory effort, chest expands symmetrically. Lungs are clear to auscultation, no crackles or wheezes. Heart:  Irreg irreg without murmurs. Abdomen:  S/NT Pulses:  pulses normal in all 4 extremities Extremities:  trace left pedal edema and trace right pedal edema.   Neurologic:  Alert and oriented x 3.   EKG  Procedure date:  05/24/2010  Findings:      Atrial fibrillation with a controlled ventricular response rate of: 74.  Impression & Recommendations:  Problem # 1:  MICTURITION SYNCOPE (ICD-788.69) He has had no recurrent symptoms.  Will follow. His updated medication list  for this problem includes:    Metoprolol Succinate 50 Mg Tb24 (Metoprolol succinate) .Marland Kitchen... 1/2 by mouth tid    Warfarin Sodium 2.5 Mg Tabs (Warfarin sodium) .Marland Kitchen... Take as directed by anticoagulation clinic    Amlodipine Besylate 2.5 Mg Tabs (Amlodipine besylate) ..... Once daily at bedtime  Problem # 2:  ATRIAL FIBRILLATION (ICD-427.31) His ventricular rate appears to be well controlled.  Continue meds as below. His updated medication list for this problem includes:    Metoprolol  Succinate 50 Mg Tb24 (Metoprolol succinate) .Marland Kitchen... 1/2 by mouth tid    Warfarin Sodium 2.5 Mg Tabs (Warfarin sodium) .Marland Kitchen... Take as directed by anticoagulation clinic  Problem # 3:  HYPERTENSION (ICD-401.9) His blood pressure is well controlled.  Continue meds as below. His updated medication list for this problem includes:    Metoprolol Succinate 50 Mg Tb24 (Metoprolol succinate) .Marland Kitchen... 1/2 by mouth tid    Amlodipine Besylate 2.5 Mg Tabs (Amlodipine besylate) ..... Once daily at bedtime    Spironolactone 25 Mg Tabs (Spironolactone) .Marland Kitchen... 1 once daily  Patient Instructions: 1)  Your physician wants you to follow-up in: 12 months with Dr Court Joy will receive a reminder letter in the mail two months in advance. If you don't receive a letter, please call our office to schedule the follow-up appointment.

## 2010-08-29 NOTE — Letter (Signed)
Summary: [facility]  [facility]   Imported By: Sherian Rein 06/19/2010 14:01:55  _____________________________________________________________________  External Attachment:    Type:   Image     Comment:   External Document

## 2010-08-29 NOTE — Letter (Signed)
[facility]   Imported By: Sherian Rein 03/31/2010 13:41:08  _____________________________________________________________________  External Attachment:    Type:   Image     Comment:   External Document

## 2010-08-29 NOTE — Medication Information (Signed)
Summary: rov/tm  Anticoagulant Therapy  Managed by: Eda Keys, PharmD PCP: Sonda Primes, MD Supervising MD: Jens Som MD, Arlys John Indication 1: Atrial Fibrillation Lab Used: [facility] [place] Site: [address] INR POC 2.0 INR RANGE 2.0-3.0  Dietary changes: no    Health status changes: no    Bleeding/hemorrhagic complications: no    Recent/future hospitalizations: no    Any changes in medication regimen? yes       Details: Eye caps (mutivitamin ), maybe starting new meidication for low platelets.  Recent/future dental: no  Any missed doses?: no       Is patient compliant with meds? yes       Allergies: 1)  ! * Pollysporin  Anticoagulation Management History:      The patient is taking warfarin and comes in today for a routine follow up visit.  Positive risk factors for bleeding include an age of 75 years or older.  The bleeding index is 'intermediate risk'.  Positive CHADS2 values include History of HTN and Age > 34 years old.  His last INR was 6.0 RATIO.  Anticoagulation responsible provider: Jens Som MD, Arlys John.  INR POC: 2.0.  Cuvette Lot#: [id number].  Exp: 12/2010.    Anticoagulation Management Assessment/Plan:      The patient's current anticoagulation dose is Warfarin sodium 2.5 mg  tabs: Take as directed by anticoagulation clinic.  The target INR is 2 - 3.  The next INR is due 01/03/2010.  Anticoagulation instructions were given to patient.  Results were reviewed/authorized by Eda Keys, PharmD.  He was notified by [facility].         Prior Anticoagulation Instructions: INR 2.1 Continue 2.5mg s daily except 1.25mg  on Fridays. Recheck in 4 weeks.   Current Anticoagulation Instructions: INR-2.0 Take 1.5 tablets  today.  Continue on normal dosing schedule. Take 1 tablet daily except for 1/2 a tablet on Friday only of each week.Return  in 4 weeks

## 2010-08-29 NOTE — Medication Information (Signed)
Summary: rov/kb  Anticoagulant Therapy  Managed by: Bethena Midget, RN, BSN PCP: Sonda Primes, MD Supervising MD: Daleen Squibb MD, Maisie Fus Indication 1: Atrial Fibrillation Lab Used: [facility] [place] Site: [address] INR POC 2.4 INR RANGE 2.0-3.0  Dietary changes: no    Health status changes: yes       Details: Platelet count low per pt. sees Hematologist next week  Bleeding/hemorrhagic complications: no    Recent/future hospitalizations: no    Any changes in medication regimen? no    Recent/future dental: no  Any missed doses?: no       Is patient compliant with meds? yes       Current Medications (verified): 1)  Metoprolol Succinate 50 Mg Tb24 (Metoprolol Succinate) .... 1/2 By Mouth Tid 2)  Pravastatin Sodium 40 Mg Tabs (Pravastatin Sodium) .Marland Kitchen.. 1p O Qd 3)  Warfarin Sodium 2.5 Mg  Tabs (Warfarin Sodium) .... Take As Directed By Anticoagulation Clinic 4)  Flomax 0.4 Mg  Cp24 (Tamsulosin Hcl) .Marland Kitchen.. 1 By Mouth At Bedtime 5)  Omeprazole 40 Mg Cpdr (Omeprazole) .... Once Daily 6)  Amlodipine Besylate 2.5 Mg Tabs (Amlodipine Besylate) .... Once Daily At Bedtime 7)  Vitamin D3 1000 Unit  Tabs (Cholecalciferol) .Marland Kitchen.. 1 By Mouth Daily 8)  Spironolactone 50 Mg Tabs (Spironolactone) .... 1/2 Tab Once Daily 9)  Icaps Mv  Tabs (Multiple Vitamins-Minerals) .... Take 1 Tablet Daily  Allergies: 1)  ! * Pollysporin  Anticoagulation Management History:      The patient is taking warfarin and comes in today for a routine follow up visit.  Positive risk factors for bleeding include an age of 44 years or older.  The bleeding index is 'intermediate risk'.  Positive CHADS2 values include History of HTN and Age > 64 years old.  His last INR was 6.0 RATIO.  Anticoagulation responsible provider: Daleen Squibb MD, Maisie Fus.  INR POC: 2.4.  Cuvette Lot#: [id number].  Exp: 02/2011.    Anticoagulation Management Assessment/Plan:      The patient's current anticoagulation dose is Warfarin sodium 2.5  mg  tabs: Take as directed by anticoagulation clinic.  The target INR is 2 - 3.  The next INR is due 01/31/2010.  Anticoagulation instructions were given to patient.  Results were reviewed/authorized by Bethena Midget, RN, BSN.  He was notified by Bethena Midget, RN, BSN.         Prior Anticoagulation Instructions: INR-2.0 Take 1.5 tablets  today.  Continue on normal dosing schedule. Take 1 tablet daily except for 1/2 a tablet on Friday only of each week.Return  in 4 weeks  Current Anticoagulation Instructions: INR 2.4 Continue 2.5mg s everyday except 1.25mg s on Fridays. Recheck in 4 weeks.

## 2010-08-29 NOTE — Progress Notes (Signed)
Summary: LABS  Phone Note Call from Patient Call back at Home Phone [phone]   Caller: Patient Call For: Tresa Garter MD Summary of Call: Pt sched for office visit on tuesday. Pt states he had recent labs for Dr Nyra Jabs Pt. Pt wants to know if these labs can be looked up by a nurse so he does not have to repeat labs before Dr Posey Rea appt on 8/23. Please let pt know if he needs pending labs for Dr Posey Rea. Initial call taken by: Verdell Face,  March 17, 2010 10:48 AM  Follow-up for Phone Call        OK to come w/o labs. We will see if we need anything to order on the same day Follow-up by: Tresa Garter MD,  March 17, 2010 2:09 PM  Additional Follow-up for Phone Call Additional follow up Details #1::        left detailed vm on hm # Additional Follow-up by: Lamar Sprinkles, CMA,  March 17, 2010 5:06 PM

## 2010-08-29 NOTE — Medication Information (Signed)
Summary: rov/tm  Anticoagulant Therapy  Managed by: Bethena Midget, RN, BSN PCP: Sonda Primes, MD Supervising MD: Excell Seltzer MD, Casimiro Needle Indication 1: Atrial Fibrillation Lab Used: [facility]  Site: [address] INR POC 2.1 INR RANGE 2.0-3.0  Dietary changes: no    Health status changes: no    Bleeding/hemorrhagic complications: no    Recent/future hospitalizations: no    Any changes in medication regimen? no    Recent/future dental: no  Any missed doses?: no       Is patient compliant with meds? yes       Allergies: 1)  ! * Pollysporin  Anticoagulation Management History:      The patient is taking warfarin and comes in today for a routine follow up visit.  Positive risk factors for bleeding include an age of 75 years or older.  The bleeding index is 'intermediate risk'.  Positive CHADS2 values include History of HTN and Age > 75 years old.  His last INR was 6.0 RATIO.  Anticoagulation responsible provider: Excell Seltzer MD, Casimiro Needle.  INR POC: 2.1.  Cuvette Lot#: [id number].  Exp: 11/2010.    Anticoagulation Management Assessment/Plan:      The patient's current anticoagulation dose is Warfarin sodium 2.5 mg  tabs: Take as directed by anticoagulation clinic.  The target INR is 2 - 3.  The next INR is due 12/06/2009.  Anticoagulation instructions were given to patient.  Results were reviewed/authorized by Bethena Midget, RN, BSN.  He was notified by Bethena Midget, RN, BSN.         Prior Anticoagulation Instructions: INR 2.1 Continue 2.5mg s daily except 1.25mg s on Fridays. Recheck in 4 weeks.   Current Anticoagulation Instructions: INR 2.1 Continue 2.5mg s daily except 1.25mg  on Fridays. Recheck in 4 weeks.

## 2010-08-29 NOTE — Medication Information (Signed)
Summary: rov/tm  Anticoagulant Therapy  Managed by: Bethena Midget, RN, BSN PCP: Sonda Primes, MD Supervising MD: Graciela Husbands MD, Viviann Spare Indication 1: Atrial Fibrillation Lab Used: [facility] [place] Site: [address] INR POC 2.2 INR RANGE 2.0-3.0  Dietary changes: no    Health status changes: no    Bleeding/hemorrhagic complications: no    Recent/future hospitalizations: no    Any changes in medication regimen? no    Recent/future dental: no  Any missed doses?: no       Is patient compliant with meds? yes       Allergies: No Known Drug Allergies  Anticoagulation Management History:      The patient is taking warfarin and comes in today for a routine follow up visit.  Positive risk factors for bleeding include an age of 75 years or older.  The bleeding index is 'intermediate risk'.  Positive CHADS2 values include History of HTN and Age > 75 years old.  His last INR was 6.0 RATIO.  Anticoagulation responsible provider: Graciela Husbands MD, Viviann Spare.  INR POC: 2.2.  Cuvette Lot#: [id number].  Exp: 10/2010.    Anticoagulation Management Assessment/Plan:      The patient's current anticoagulation dose is Warfarin sodium 2.5 mg  tabs: Take as directed by anticoagulation clinic.  The target INR is 2 - 3.  The next INR is due 10/11/2009.  Anticoagulation instructions were given to patient.  Results were reviewed/authorized by Bethena Midget, RN, BSN.  He was notified by Bethena Midget, RN, BSN.         Prior Anticoagulation Instructions: INR 2.1 Continue 2.5mg s daily except 1.25mg s on Fridays. Recheck in 4 weeks.    Current Anticoagulation Instructions: INR 2.2 Continue 2.5mg s everyday except 1.25mg  on Fridays. Recheck in 4 weeks.

## 2010-08-29 NOTE — Assessment & Plan Note (Signed)
Summary: 2 mos f/u #cd   Vital Signs:  Patient profile:   77 year old male Weight:      212 pounds Temp:     98.3 degrees F oral Pulse rate:   66 / minute BP sitting:   144 / 70  (left arm)  Vitals Entered By: Tora Perches (August 17, 2009 9:34 AM) CC: f/u Is Patient Diabetic? No   Primary Care Provider:  Sonda Primes, MD  CC:  f/u.  History of Present Illness: The patient presents for a follow up of hypertension, A fib, hyperlipidemia, low plt count   Preventive Screening-Counseling & Management  Alcohol-Tobacco     Smoking Status: quit  Current Medications (verified): 1)  Metoprolol Succinate 50 Mg Tb24 (Metoprolol Succinate) .... 1/2 By Mouth Tid 2)  Pravastatin Sodium 40 Mg Tabs (Pravastatin Sodium) .Marland Kitchen.. 1p O Qd 3)  Warfarin Sodium 2.5 Mg  Tabs (Warfarin Sodium) .... Take As Directed By Anticoagulation Clinic 4)  Flomax 0.4 Mg  Cp24 (Tamsulosin Hcl) .Marland Kitchen.. 1 By Mouth At Bedtime 5)  Omeprazole 40 Mg Cpdr (Omeprazole) .... Once Daily 6)  Amlodipine Besylate 2.5 Mg Tabs (Amlodipine Besylate) .... Once Daily At Bedtime 7)  Vitamin D3 1000 Unit  Tabs (Cholecalciferol) .Marland Kitchen.. 1 By Mouth Daily 8)  Spironolactone 25 Mg Tabs (Spironolactone) .... 1/2  By Mouth Q Am  Allergies (verified): No Known Drug Allergies  Past History:  Past Surgical History: Last updated: 10/17/2007 left hip fx/s/p left hip surgury Coronary artery bypass graft - 1995 s/p ascending colectomy - laparoscopic 2005 hx of pneumothorax spontaneous 1995 Cataract extraction  Social History: Last updated: 08/24/2008 retired group insurance Married 2 children Former Smoker Alcohol use-yes wife with Parkinson's  Past Medical History: Colon cancer, hx of Colonic polyps, hx of Coronary artery disease Dr Ladona Ridgel Hyperlipidemia Hypertension Peripheral vascular disease - bilat carotid Transient ischemic attack, hx of x 2 Atrial fibrillation chronic coumadin Myocardial infarction, hx  of Diverticulitis, hx of Benign prostatic hypertrophy Dr Jabier Gauss deficiency Peripheral vascular disease - known small AAA hx of LGI bleed - diverticular Gout Skin cancer, hx of L hand 2010 Low PLTs 2010  Physical Exam  General:  overweight-appearing.   Nose:  External nasal examination shows no deformity or inflammation. Nasal mucosa are pink and moist without lesions or exudates. Mouth:  Oral mucosa and oropharynx without lesions or exudates.  Teeth in good repair. Neck:  No deformities, masses, or tenderness noted. Lungs:  Normal respiratory effort, chest expands symmetrically. Lungs are clear to auscultation, no crackles or wheezes. Heart:  Irreg irreg Abdomen:  S/NT Msk:  L arm in a sling Extremities:  trace left pedal edema and trace right pedal edema.   Neurologic:  No cranial nerve deficits noted. Station and gait are normal. Plantar reflexes are down-going bilaterally. DTRs are symmetrical throughout. Sensory, motor and coordinative functions appear intact. Skin:  Clear Psych:  Cognition and judgment appear intact. Alert and cooperative with normal attention span and concentration. No apparent delusions, illusions, hallucinations   Impression & Recommendations:  Problem # 1:  THROMBOCYTOPENIA (ICD-287.5) Assessment Improved Watching The labs were reviewed with the patient.   Problem # 2:  EDEMA (ICD-782.3) Assessment: Improved  His updated medication list for this problem includes:    Spironolactone 25 Mg Tabs (Spironolactone) .Marland Kitchen... 1/2  by mouth q am  Problem # 3:  HYPERTENSION (ICD-401.9) Assessment: Improved  His updated medication list for this problem includes:    Metoprolol Succinate 50 Mg Tb24 (Metoprolol succinate) .Marland KitchenMarland KitchenMarland KitchenMarland Kitchen  1/2 by mouth tid    Amlodipine Besylate 2.5 Mg Tabs (Amlodipine besylate) ..... Once daily at bedtime    Spironolactone 25 Mg Tabs (Spironolactone) .Marland Kitchen... 1/2  by mouth q am  Problem # 4:  PERIPHERAL VASCULAR DISEASE  (ICD-443.9) Assessment: Unchanged  Problem # 5:  TRANSIENT ISCHEMIC ATTACK, HX OF (ICD-V12.50) Assessment: Unchanged  Problem # 6:  COLON CANCER, HX OF (ICD-V10.05) Assessment: Unchanged  Problem # 7:  HYPERKALEMIA (ICD-276.7) Assessment: Comment Only Watching See "Patient Instructions".  The labs were reviewed with the patient.   Complete Medication List: 1)  Metoprolol Succinate 50 Mg Tb24 (Metoprolol succinate) .... 1/2 by mouth tid 2)  Pravastatin Sodium 40 Mg Tabs (Pravastatin sodium) .Marland Kitchen.. 1p o qd 3)  Warfarin Sodium 2.5 Mg Tabs (Warfarin sodium) .... Take as directed by anticoagulation clinic 4)  Flomax 0.4 Mg Cp24 (Tamsulosin hcl) .Marland Kitchen.. 1 by mouth at bedtime 5)  Omeprazole 40 Mg Cpdr (Omeprazole) .... Once daily 6)  Amlodipine Besylate 2.5 Mg Tabs (Amlodipine besylate) .... Once daily at bedtime 7)  Vitamin D3 1000 Unit Tabs (Cholecalciferol) .Marland Kitchen.. 1 by mouth daily 8)  Spironolactone 25 Mg Tabs (Spironolactone) .... 1/2  by mouth q am  Patient Instructions: 1)  Please schedule a follow-up appointment in 3 months. 2)  BMP prior to visit, ICD-9: 3)  TSH prior to visit, ICD-9:995.20 4)  CBC w/ Diff prior to visit, ICD-9: Prescriptions: OMEPRAZOLE 40 MG CPDR (OMEPRAZOLE) once daily  #90 x 3   Entered and Authorized by:   Tresa Garter MD   Signed by:   Tresa Garter MD on 08/17/2009   Method used:   Print then Give to Patient   RxID:   [id number] OMEPRAZOLE 40 MG CPDR (OMEPRAZOLE) once daily  #90 x 3   Entered and Authorized by:   Tresa Garter MD   Signed by:   Tresa Garter MD on 08/17/2009   Method used:   Electronically to        [facility] (mail-order)             ,          Ph: [phone]       Fax: [phone]   RxID:   [id number]

## 2010-08-29 NOTE — Progress Notes (Signed)
Summary: Med ?  Phone Note From Pharmacy   Caller: [facility] Summary of Call: rec fax requesting New rx for Spironolactone 25 mg 1 qam.  Our list says 1/2 tablet but pt says he takes 1 once daily.  Ok to send in new RX for 1 once daily  #90?  fax #[phone] Initial call taken by: Lanier Prude, [facility]),  May 05, 2010 8:44 AM  Follow-up for Phone Call        ok Thank you!  Follow-up by: Tresa Garter MD,  May 05, 2010 1:12 PM    New/Updated Medications: SPIRONOLACTONE 25 MG TABS (SPIRONOLACTONE) 1 once daily Prescriptions: SPIRONOLACTONE 25 MG TABS (SPIRONOLACTONE) 1 once daily  #90 x 3   Entered by:   Lamar Sprinkles, CMA   Authorized by:   Tresa Garter MD   Signed by:   Lamar Sprinkles, CMA on 05/05/2010   Method used:   Faxed to ...       [facility] (mail-order)             , [place]         Ph: [phone]       Fax: [phone]   RxID:   [id number]

## 2010-08-30 DIAGNOSIS — Z7901 Long term (current) use of anticoagulants: Secondary | ICD-10-CM | POA: Insufficient documentation

## 2010-08-30 DIAGNOSIS — I4891 Unspecified atrial fibrillation: Secondary | ICD-10-CM

## 2010-08-31 NOTE — Medication Information (Signed)
Summary: rov/tm   Anticoagulant Therapy  Managed by: Weston Brass, PharmD PCP: Sonda Primes, MD Supervising MD: Daleen Squibb MD, Maisie Fus Indication 1: Atrial Fibrillation Lab Used: [facility] [place] Site: [address] INR POC 2.2 INR RANGE 2.0-3.0  Dietary changes: no    Health status changes: no    Bleeding/hemorrhagic complications: no    Recent/future hospitalizations: no    Any changes in medication regimen? no    Recent/future dental: no  Any missed doses?: no       Is patient compliant with meds? yes       Allergies: 1)  ! * Pollysporin  Anticoagulation Management History:      The patient is taking warfarin and comes in today for a routine follow up visit.  Positive risk factors for bleeding include an age of 75 years or older.  The bleeding index is 'intermediate risk'.  Positive CHADS2 values include History of HTN and Age > 71 years old.  His last INR was 6.0 RATIO.  Anticoagulation responsible provider: Daleen Squibb MD, Maisie Fus.  INR POC: 2.2.  Cuvette Lot#: [id number].  Exp: 08/2011.    Anticoagulation Management Assessment/Plan:      The patient's current anticoagulation dose is Warfarin sodium 2.5 mg  tabs: Take as directed by anticoagulation clinic.  The target INR is 2 - 3.  The next INR is due 08/15/2010.  Anticoagulation instructions were given to patient.  Results were reviewed/authorized by Weston Brass, PharmD.  He was notified by Weston Brass PharmD.         Prior Anticoagulation Instructions: INR 2.1 Continue 1 pill daily except 1/2 pill on Fridays. Recheck in 4 weeks.   Current Anticoagulation Instructions: INR 2.2  Continue same dose of 1 tablet every day except 1/2 tablets on Friday.  Recheck INR in 4 weeks.

## 2010-08-31 NOTE — Assessment & Plan Note (Signed)
Summary: 4 MO ROV /NWS  #   Vital Signs:  Patient profile:   77 year old male Height:      72 inches Weight:      218 pounds BMI:     29.67 Temp:     98.6 degrees F oral Pulse rate:   76 / minute Pulse rhythm:   regular Resp:     16 per minute BP sitting:   140 / 82  (left arm) Cuff size:   regular  Vitals Entered By: Lanier Prude, CMA(AAMA) (July 28, 2010 10:32 AM) CC: 4 mo f/u  Is Patient Diabetic? No   Primary Care Provider:  Sonda Primes, MD  CC:  4 mo f/u .  History of Present Illness: The patient presents for a follow up of hypertension, CAD, hyperlipidemia, gout, A fib He is recovering from URI He had a gout spell once The patient presents for a preventive health examination Patient past medical history, social history, and family history reviewed in detail no significant changes.  Patient is physically active. Depression is negative and mood is good. Hearing is normal, and able to perform activities of daily living. Risk of falling is negligible and home safety has been reviewed and is appropriate. Patient has normal height, weight, and visual acuity. Patient has been counseled on age-appropriate routine health concerns for screening and prevention. Education, counseling done. Cognition is good  Preventive Screening-Counseling & Management  Alcohol-Tobacco     Alcohol drinks/day: 0     Smoking Status: quit > 6 months  Caffeine-Diet-Exercise     Caffeine Counseling: not indicated; caffeine use is not excessive or problematic     Diet Counseling: not indicated; diet is assessed to be healthy     Does Patient Exercise: no     Exercise Counseling: to improve exercise regimen     Depression Counseling: not indicated; screening negative for depression  Hep-HIV-STD-Contraception     Hepatitis Risk: no risk noted     Sun Exposure-Excessive: no  Safety-Violence-Falls     Seat Belt Use: yes     Fall Risk Counseling: counseling provided; falls with injury  noted      Sexual History:  currently monogamous.    Current Medications (verified): 1)  Metoprolol Succinate 50 Mg Tb24 (Metoprolol Succinate) .... 1/2 By Mouth Tid 2)  Pravastatin Sodium 40 Mg Tabs (Pravastatin Sodium) .Marland Kitchen.. 1p O Qd 3)  Warfarin Sodium 2.5 Mg  Tabs (Warfarin Sodium) .... Take As Directed By Anticoagulation Clinic 4)  Flomax 0.4 Mg  Cp24 (Tamsulosin Hcl) .Marland Kitchen.. 1 By Mouth At Bedtime 5)  Omeprazole 40 Mg Cpdr (Omeprazole) .... Once Daily 6)  Amlodipine Besylate 2.5 Mg Tabs (Amlodipine Besylate) .... Once Daily At Bedtime 7)  Vitamin D3 1000 Unit  Tabs (Cholecalciferol) .Marland Kitchen.. 1 By Mouth Daily 8)  Spironolactone 25 Mg Tabs (Spironolactone) .Marland Kitchen.. 1 Once Daily 9)  Icaps Mv  Tabs (Multiple Vitamins-Minerals) .... Take 1 Tablet Daily 10)  Ibuprofen 600 Mg Tabs (Ibuprofen) .Marland Kitchen.. 1 By Mouth Bid  Pc X 1 Wk Then As Needed For  Pain/gout 11)  Miralax  Powd (Polyethylene Glycol 3350) .Marland Kitchen.. 1 Scoop Once Daily Prn  Allergies (verified): 1)  ! * Pollysporin  Past History:  Past Medical History: Colon cancer, hx of Colonic polyps, hx of Coronary artery disease Dr Ladona Ridgel Hyperlipidemia Hypertension Peripheral vascular disease - bilat carotid Transient ischemic attack, hx of x 2 Atrial fibrillation chronic coumadin Myocardial infarction, hx of Diverticulitis, hx of Benign prostatic hypertrophy  Dr Jabier Gauss deficiency Peripheral vascular disease - known small AAA hx of LGI bleed - diverticular Gout Skin cancer, hx of L hand 2010 Low PLTs 2010 Dr Myna Hidalgo  Past Surgical History: left hip fx/s/p left hip surgury Coronary artery bypass graft - 1995 s/p ascending colectomy - laparoscopic 2005 hx of pneumothorax spontaneous 1995 Cataract extraction Colonosc 2005?  Social History: retired Insurance underwriter Married, wife is ill 2 children Former Smoker Alcohol use-yes wife with Parkinson'sSmoking Status:  quit > 6 months Does Patient Exercise:  no Hepatitis Risk:  no  risk noted Sun Exposure-Excessive:  no Seat Belt Use:  yes Sexual History:  currently monogamous  Review of Systems       nl BP at home  Physical Exam  General:  overweight-appearing.   Eyes:  No corneal or conjunctival inflammation noted. EOMI. Perrla Ears:  External ear exam shows no significant lesions or deformities.  Otoscopic examination reveals clear canals, tympanic membranes are intact bilaterally without bulging, retraction, inflammation or discharge. Hearing is grossly normal bilaterally. Nose:  External nasal examination shows no deformity or inflammation. Nasal mucosa are pink and moist without lesions or exudates. Mouth:  Oral mucosa and oropharynx without lesions or exudates.  Teeth in good repair. Neck:  No deformities, masses, or tenderness noted. Lungs:  Normal respiratory effort, chest expands symmetrically. Lungs are clear to auscultation, no crackles or wheezes. Heart:  Irreg irreg Abdomen:  S/NT Msk:  L arm in a sling Neurologic:  No cranial nerve deficits noted. Station and gait are normal. Plantar reflexes are down-going bilaterally. DTRs are symmetrical throughout. Sensory, motor and coordinative functions appear intact. Skin:  Clear Psych:  Cognition and judgment appear intact. Alert and cooperative with normal attention span and concentration. No apparent delusions, illusions, hallucinations   Impression & Recommendations:  Problem # 1:  HEALTH MAINTENANCE EXAM (ICD-V70.0) Assessment New Overall doing well, age appropriate education and counseling updated and referral for appropriate preventive services done unless declined, immunizations up to date or declined, diet counseling done if overweight, urged to quit smoking if smokes, most recent labs reviewed and current ordered if appropriate, ecg reviewed or declined (interpretation per ECG scanned in the EMR if done); information regarding Medicare Preventation requirements given if appropriate.   Orders: Gastroenterology Referral (GI) Medicare -1st Annual Wellness Visit [phone]) I have personally reviewed the Medicare Annual Wellness questionnaire and have noted 1.   The patient's medical and social history 2.   Their use of alcohol, tobacco or illicit drugs 3.   Their current medications and supplements 4.   The patient's functional ability including ADL's, fall risks, home safety risks and hearing or visual             impairment. 5.   Diet and physical activities 6.   Evidence for depression or mood disorders  The patients weight, height, BMI and visual acuity have been recorded in the chart I have made referrals, counseling and provided education to the patient based review of the above and I have provided the pt with a written personalized care plan for preventive services.  Problem # 2:  GOUT (ICD-274.9) Assessment: Unchanged Rare On the regimen of medicine(s) reflected in the chart    Problem # 3:  THROMBOCYTOPENIA (ICD-287.5) Assessment: Unchanged The labs were reviewed with the patient. S/p Hem/Onc eval.  Problem # 4:  EDEMA (ICD-782.3) Assessment: Improved  His updated medication list for this problem includes:    Spironolactone 25 Mg Tabs (Spironolactone) .Marland Kitchen... 1 once daily  Problem # 5:  ATRIAL FIBRILLATION (ICD-427.31) Assessment: Unchanged  His updated medication list for this problem includes:    Metoprolol Succinate 50 Mg Tb24 (Metoprolol succinate) .Marland Kitchen... 1/2 by mouth tid    Warfarin Sodium 2.5 Mg Tabs (Warfarin sodium) .Marland Kitchen... Take as directed by anticoagulation clinic    Amlodipine Besylate 2.5 Mg Tabs (Amlodipine besylate) ..... Once daily at bedtime  Problem # 6:  CORONARY ARTERY DISEASE (ICD-414.00) Assessment: Unchanged  His updated medication list for this problem includes:    Metoprolol Succinate 50 Mg Tb24 (Metoprolol succinate) .Marland Kitchen... 1/2 by mouth tid    Amlodipine Besylate 2.5 Mg Tabs (Amlodipine besylate) ..... Once daily at bedtime     Spironolactone 25 Mg Tabs (Spironolactone) .Marland Kitchen... 1 once daily  Complete Medication List: 1)  Metoprolol Succinate 50 Mg Tb24 (Metoprolol succinate) .... 1/2 by mouth tid 2)  Pravastatin Sodium 40 Mg Tabs (Pravastatin sodium) .Marland Kitchen.. 1p o qd 3)  Warfarin Sodium 2.5 Mg Tabs (Warfarin sodium) .... Take as directed by anticoagulation clinic 4)  Flomax 0.4 Mg Cp24 (Tamsulosin hcl) .Marland Kitchen.. 1 by mouth at bedtime 5)  Omeprazole 40 Mg Cpdr (Omeprazole) .... Once daily 6)  Amlodipine Besylate 2.5 Mg Tabs (Amlodipine besylate) .... Once daily at bedtime 7)  Vitamin D3 1000 Unit Tabs (Cholecalciferol) .Marland Kitchen.. 1 by mouth daily 8)  Spironolactone 25 Mg Tabs (Spironolactone) .Marland Kitchen.. 1 once daily 9)  Icaps Mv Tabs (Multiple vitamins-minerals) .... Take 1 tablet daily 10)  Ibuprofen 600 Mg Tabs (Ibuprofen) .Marland Kitchen.. 1 by mouth bid  pc x 1 wk then as needed for  pain/gout 11)  Miralax Powd (Polyethylene glycol 3350) .Marland Kitchen.. 1 scoop once daily prn  Patient Instructions: 1)  Please schedule a follow-up appointment in 6 months. 2)  BMP prior to visit, ICD-9: 3)  CBC w/ Diff prior to visit, ICD-9:995.20 4)  Hepatic Panel prior to visit, ICD-9: 5)  Lipid Panel prior to visit, ICD-9:272.0   Orders Added: 1)  Gastroenterology Referral [GI] 2)  Medicare -1st Annual Wellness Visit [G0438] 3)  Est. Patient Level IV [21308]   Immunization History:  Pneumovax Immunization History:    Pneumovax:  historical (04/09/2007)   Immunization History:  Pneumovax Immunization History:    Pneumovax:  Historical (04/09/2007)

## 2010-08-31 NOTE — Letter (Signed)
Summary: New Patient letter  [facility]  [address] [place], [place] [address]   Phone: [phone]  Fax: [phone]       08/01/2010 MRN: [id number]  SHEIKH LEVERICH [address] [place], [place]  [address]  Dear Mr. Seabrook,  Welcome to the Gastroenterology Division at [facility].    You are scheduled to see Dr.  Christella Hartigan on 09-06-10 at 10:00a.m. on the 3rd floor at [facility], [address]  We ask that you try to arrive at our office 15 minutes prior to your appointment time to allow for check-in.  We would like you to complete the enclosed self-administered evaluation form prior to your visit and bring it with you on the day of your appointment.  We will review it with you.  Also, please bring a complete list of all your medications or, if you prefer, bring the medication bottles and we will list them.  Please bring your insurance card so that we may make a copy of it.  If your insurance requires a referral to see a specialist, please bring your referral form from your primary care physician.  Co-payments are due at the time of your visit and may be paid by cash, check or credit card.     Your office visit will consist of a consult with your physician (includes a physical exam), any laboratory testing he/she may order, scheduling of any necessary diagnostic testing (e.g. x-ray, ultrasound, CT-scan), and scheduling of a procedure (e.g. Endoscopy, Colonoscopy) if required.  Please allow enough time on your schedule to allow for any/all of these possibilities.    If you cannot keep your appointment, please call [phone] to cancel or reschedule prior to your appointment date.  This allows [place] the opportunity to schedule an appointment for another patient in need of care.  If you do not cancel or reschedule by 5 p.m. the business day prior to your appointment date, you will be charged a $50.00 late cancellation/no-show fee.    Thank  you for choosing [facility] for your medical needs.  We appreciate the opportunity to care for you.  Please visit [place] at our website  to learn more about our practice.                     Sincerely,                                                             The Gastroenterology Division

## 2010-08-31 NOTE — Medication Information (Signed)
Summary: rov/tp   Anticoagulant Therapy  Managed by: Geoffry Paradise, PharmD PCP: Sonda Primes, MD Supervising MD: Riley Kill MD, Maisie Fus Indication 1: Atrial Fibrillation Lab Used: [facility] [place] Site: [address] INR POC 2.2 INR RANGE 2.0-3.0  Dietary changes: no    Health status changes: no    Bleeding/hemorrhagic complications: no    Recent/future hospitalizations: no    Any changes in medication regimen? no    Recent/future dental: no  Any missed doses?: no       Is patient compliant with meds? yes       Allergies: 1)  ! * Pollysporin  Anticoagulation Management History:      Positive risk factors for bleeding include an age of 75 years or older.  The bleeding index is 'intermediate risk'.  Positive CHADS2 values include History of HTN and Age > 92 years old.  His last INR was 6.0 RATIO.  Anticoagulation responsible provider: Riley Kill MD, Maisie Fus.  INR POC: 2.2.  Cuvette Lot#: [id number].  Exp: 07/2011.    Anticoagulation Management Assessment/Plan:      The patient's current anticoagulation dose is Warfarin sodium 2.5 mg  tabs: Take as directed by anticoagulation clinic.  The target INR is 2 - 3.  The next INR is due 09/13/2010.  Anticoagulation instructions were given to patient.  Results were reviewed/authorized by Geoffry Paradise, PharmD.         Prior Anticoagulation Instructions: INR 2.2  Continue same dose of 1 tablet every day except 1/2 tablets on Friday.  Recheck INR in 4 weeks.   Current Anticoagulation Instructions: INR:  2.2 (goal 2-3)  Your INR is at goal today.  Continue taking your Coumadin 1 tablet daily except 1/2 tablet on Fridays.  Return in 4 weeks for another INR check.

## 2010-09-06 ENCOUNTER — Encounter: Payer: Self-pay | Admitting: Gastroenterology

## 2010-09-06 ENCOUNTER — Encounter (INDEPENDENT_AMBULATORY_CARE_PROVIDER_SITE_OTHER): Payer: Self-pay | Admitting: *Deleted

## 2010-09-06 ENCOUNTER — Ambulatory Visit (INDEPENDENT_AMBULATORY_CARE_PROVIDER_SITE_OTHER): Payer: Medicare Other | Admitting: Gastroenterology

## 2010-09-06 DIAGNOSIS — Z86010 Personal history of colonic polyps: Secondary | ICD-10-CM

## 2010-09-06 NOTE — Procedures (Signed)
Summary: Colon  Patient Name: John [name], John [name] MRN:  Procedure Procedures: Colonoscopy CPT: [address].    with Hot Biopsy(s)CPT: Z451292.    with polypectomy. CPT: A3573898.  Personnel: Endoscopist: Ulyess Mort, MD.  Exam Location: Exam performed in Outpatient Clinic. Outpatient  Patient Consent: Procedure, Alternatives, Risks and Benefits discussed, consent obtained, from patient. Consent was obtained by the RN.  Indications  Surveillance of: Adenomatous Polyp(s).  History  Current Medications: Patient is not currently taking Coumadin.  Pre-Exam Physical: Performed Oct 01, 2002. Entire physical exam was normal. Cardio- pulmonary exam, Rectal exam, HEENT exam , Abdominal exam, Extremity exam, Mental status exam WNL.  Exam Exam: Extent of exam reached: Cecum, extent intended: Cecum.  The cecum was identified by appendiceal orifice and IC valve. Colon retroflexion performed. Images were not taken. ASA Classification: II. Tolerance: excellent.  Monitoring: Pulse and BP monitoring, Oximetry used. Supplemental O2 given.  Colon Prep Prep results: fair, adequate exam.  Sedation Meds: Patient assessed and found to be appropriate for moderate (conscious) sedation. Fentanyl 100 mcg. given IV. Versed 10 mg. given IV.  Findings - DIVERTICULOSIS: Transverse Colon to Sigmoid Colon. ICD9: Diverticulosis: 562.10. Comments: moderately severe.  POLYP: Transverse Colon, Maximum size: 3 mm. sessile polyp. Procedure:  hot biopsy, removed, not retrieved, ICD9: Colon Polyps: 211.3.  - POLYP: Ascending Colon, Maximum size: 19 mm. sessile polyp. Procedure:  hot biopsy, removed, retrieved, Polyp sent to pathology. ICD9: Colon Polyps: 211.3. Comments: only bx,s obtained   believe needs lap. segmental resection.  - HEMORRHOIDS: External. Size: Grade II. ICD9: Hemorrhoids, External: 455.3.   Assessment Abnormal examination, see findings above.  Diagnoses: 211.3: Colon Polyps.  562.10:  Diverticulosis.  455.3: Hemorrhoids, External.   Events  Unplanned Interventions: No intervention was required.  Unplanned Events: There were no complications. Plans Medication Plan: Await pathology. Continue current medications.  Patient Education: Patient given standard instructions for: Polyps. Diverticulosis. Hemorrhoids. Yearly hemoccult testing recommended. Patient instructed to get routine colonoscopy every 3 years.  Disposition: After procedure patient sent to recovery. After recovery patient sent home.

## 2010-09-13 ENCOUNTER — Encounter (INDEPENDENT_AMBULATORY_CARE_PROVIDER_SITE_OTHER): Payer: Medicare Other

## 2010-09-13 ENCOUNTER — Encounter: Payer: Self-pay | Admitting: Cardiology

## 2010-09-13 ENCOUNTER — Encounter (INDEPENDENT_AMBULATORY_CARE_PROVIDER_SITE_OTHER): Payer: Self-pay | Admitting: *Deleted

## 2010-09-13 DIAGNOSIS — I4891 Unspecified atrial fibrillation: Secondary | ICD-10-CM

## 2010-09-13 DIAGNOSIS — Z7901 Long term (current) use of anticoagulants: Secondary | ICD-10-CM

## 2010-09-13 LAB — CONVERTED CEMR LAB: POC INR: 2.5

## 2010-09-14 NOTE — Letter (Addendum)
Summary: Anticoagulation Modification Letter  [facility]  [address] [place], [place] [address]   Phone: [phone]  Fax: [phone]    September 06, 2010  Re:    BENARD, MINTURN DOB:    [birth year] MRN:    [id number]    Dear Dr Ladona Ridgel,  We have scheduled the above patient for an endoscopic procedure. Our records show that  he/she is on anticoagulation therapy. Please advise as to how long the patient may come off their therapy of coumadin prior to the scheduled procedure. (colon)  Please fax back/or route the completed form to Patty at [phone]  Thank you for your help with this matter.  Sincerely,  Chales Abrahams CMA (AAMA)   Physician Recommendation:  Hold Plavix 7 days prior ________________  Hold Coumadin 5 days prior ____________  Other ______________________________     Appended Document: Anticoagulation Modification Letter With h/o TIA's, would not stop coumadin prior to the procedure.  Appended Document: Anticoagulation Modification Letter ok, lets do it on coumadin.   If a signficant lesion is found, then will have to probably hold coumadin and start lovenox bridge to safely remove it.  Appended Document: Anticoagulation Modification Letter pt aware colon and previsit scheduled

## 2010-09-14 NOTE — Assessment & Plan Note (Signed)
Review of gastrointestinal problems: 1. 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 Underwent surgical resection October 2005, lap assisted right hemicolectomy. 9/9 lymph nodes were negative for carcinoma. 2008, recommended to have repeat colonoscopy, he was scheduled for this but then canceled for unclear reasons. 01/2009 saw Dr. Christella Hartigan again, recommended again to have colonoscopy but he cancelled.    History of Present Illness Visit Type: Initial Consult Primary GI MD: Rob Bunting MD Primary Provider: Sula Soda, MD Requesting Provider: Sula Soda, MD Chief Complaint: Consult colon  History of Present Illness:     pleasant 77 year old man whom I last saw about a year and a half ago. At that time I recommended he have a repeat colonoscopy. He canceled the procedure. He did the exact same thing 2 years prior as well. This will be the third time I have recommended he have a colonoscopy.  Labs last month show that he was nonanemic, he has no overt GI symptoms.           Current Medications (verified): 1)  Metoprolol Succinate 50 Mg Tb24 (Metoprolol Succinate) .... 1/2 By Mouth Tid 2)  Pravastatin Sodium 40 Mg Tabs (Pravastatin Sodium) .Marland Kitchen.. 1p O Qd 3)  Warfarin Sodium 2.5 Mg  Tabs (Warfarin Sodium) .... Take As Directed By Anticoagulation Clinic 4)  Flomax 0.4 Mg  Cp24 (Tamsulosin Hcl) .Marland Kitchen.. 1 By Mouth At Bedtime 5)  Omeprazole 40 Mg Cpdr (Omeprazole) .... Once Daily 6)  Amlodipine Besylate 2.5 Mg Tabs (Amlodipine Besylate) .... Once Daily At Bedtime 7)  Vitamin D3 1000 Unit  Tabs (Cholecalciferol) .Marland Kitchen.. 1 By Mouth Daily 8)  Spironolactone 25 Mg Tabs (Spironolactone) .Marland Kitchen.. 1 Once Daily 9)  Icaps Mv  Tabs (Multiple Vitamins-Minerals) .... Take 1 Tablet Daily 10)  Ibuprofen 600 Mg Tabs (Ibuprofen) .Marland Kitchen.. 1 By Mouth Bid  Pc X 1 Wk Then As Needed For  Pain/gout 11)  Miralax  Powd (Polyethylene Glycol 3350) .Marland Kitchen.. 1 Scoop Once Daily Prn  Allergies  (verified): 1)  ! * Pollysporin  Vital Signs:  Patient profile:   77 year old male Height:      72 inches Weight:      214 pounds BMI:     29.13 BSA:     2.19 Pulse rate:   68 / minute Pulse rhythm:   regular BP sitting:   138 / 76  (left arm) Cuff size:   regular  Vitals Entered By: Ok Anis CMA (September 06, 2010 10:05 AM)  Physical Exam  Additional Exam:  Constitutional: generally well appearing Psychiatric: alert and oriented times 3 Abdomen: soft, non-tender, non-distended, normal bowel sounds    Impression & Recommendations:  Problem # 1:  history of tubulovillous adenoma I have recommended again, for a third time, that he have a repeat colonoscopy. Hopefully he will go through it at this time. I see no reason for any further blood tests or imaging studies.  He will need hold Coumadin for 5 days prior to the procedure. We will contact his cardiologist to make sure that is okay.  Patient Instructions: 1)  You will be scheduled to have a colonoscopy. 2)  We will contact Dr. Ladona Ridgel about holding your coumadin for 5 days prior to colonoscopy. 3)  A copy of this information will be sent to Dr. Posey Rea. 4)  The medication list was reviewed and reconciled.  All changed / newly prescribed medications were explained.  A complete medication list was provided to the patient / caregiver.  Appended Document:  pt wants to call back to schedule

## 2010-09-20 NOTE — Medication Information (Signed)
Summary: Coumadin Clinic   Anticoagulant Therapy  Managed by: Windell Hummingbird, RN PCP: Sula Soda, MD Supervising MD: Shirlee Latch MD, Dalton Indication 1: Atrial Fibrillation Lab Used: [facility] [place] Site: [address] INR POC 2.5 INR RANGE 2.0-3.0  Dietary changes: no    Health status changes: no    Bleeding/hemorrhagic complications: no    Recent/future hospitalizations: yes       Details: Colonoscopy within about 5 weeks. Not going to hold coumadin per Dr. Ladona Ridgel  Any changes in medication regimen? no    Recent/future dental: no  Any missed doses?: no       Is patient compliant with meds? yes       Allergies: 1)  ! * Pollysporin  Anticoagulation Management History:      The patient is taking warfarin and comes in today for a routine follow up visit.  Positive risk factors for bleeding include an age of 75 years or older.  The bleeding index is 'intermediate risk'.  Positive CHADS2 values include History of HTN and Age > 29 years old.  His last INR was 6.0 RATIO.  Anticoagulation responsible provider: Shirlee Latch MD, Dalton.  INR POC: 2.5.  Cuvette Lot#: [id number].  Exp: 07/2011.    Anticoagulation Management Assessment/Plan:      The patient's current anticoagulation dose is Warfarin sodium 2.5 mg  tabs: Take as directed by anticoagulation clinic.  The target INR is 2 - 3.  The next INR is due 10/11/2010.  Anticoagulation instructions were given to patient.  Results were reviewed/authorized by Windell Hummingbird, RN.  He was notified by Windell Hummingbird, RN.         Prior Anticoagulation Instructions: INR:  2.2 (goal 2-3)  Your INR is at goal today.  Continue taking your Coumadin 1 tablet daily except 1/2 tablet on Fridays.  Return in 4 weeks for another INR check.    Current Anticoagulation Instructions: INR 2.5 Continue 1 tablet everyday, except 1/2 tablet on Fridays. Recheck in 4 weeks.

## 2010-09-20 NOTE — Letter (Signed)
Summary: Previsit letter  [facility]  [address] [place], [place] [address]   Phone: [phone]  Fax: [phone]       09/13/2010 MRN: [id number]  John [name] [address] [place], [place]  [address]  [place]  Dear Mr. Nahm,  Welcome to the Gastroenterology Division at [facility].    You are scheduled to see a nurse for your pre-procedure visit on 10/18/10 at 10 am on the 3rd floor at [facility], [address]  We ask that you try to arrive at our office 15 minutes prior to your appointment time to allow for check-in.  Your nurse visit will consist of discussing your medical and surgical history, your immediate family medical history, and your medications.    Please bring a complete list of all your medications or, if you prefer, bring the medication bottles and we will list them.  We will need to be aware of both prescribed and over the counter drugs.  We will need to know exact dosage information as well.  If you are on blood thinners (Coumadin, Plavix, Aggrenox, Ticlid, etc.) please call our office today/prior to your appointment, as we need to consult with your physician about holding your medication.   Please be prepared to read and sign documents such as consent forms, a financial agreement, and acknowledgement forms.  If necessary, and with your consent, a friend or relative is welcome to sit-in on the nurse visit with you.  Please bring your insurance card so that we may make a copy of it.  If your insurance requires a referral to see a specialist, please bring your referral form from your primary care physician.  No co-pay is required for this nurse visit.     If you cannot keep your appointment, please call [phone] to cancel or reschedule prior to your appointment date.  This allows [place] the opportunity to schedule an appointment for another patient in need of care.    Thank you for choosing [facility]  for your medical needs.  We appreciate the opportunity to care for you.  Please visit [place] at our website  to learn more about our practice.                     Sincerely.                                                                                                                   The Gastroenterology Division

## 2010-10-09 ENCOUNTER — Telehealth: Payer: Self-pay | Admitting: Internal Medicine

## 2010-10-11 ENCOUNTER — Encounter (INDEPENDENT_AMBULATORY_CARE_PROVIDER_SITE_OTHER): Payer: Medicare Other

## 2010-10-11 ENCOUNTER — Encounter: Payer: Self-pay | Admitting: Cardiology

## 2010-10-11 DIAGNOSIS — I4891 Unspecified atrial fibrillation: Secondary | ICD-10-CM

## 2010-10-11 DIAGNOSIS — Z7901 Long term (current) use of anticoagulants: Secondary | ICD-10-CM

## 2010-10-11 LAB — CONVERTED CEMR LAB: POC INR: 2.1

## 2010-10-17 NOTE — Progress Notes (Signed)
Summary: question about stop Coumadin due to procedure   Phone Note Call from Patient Call back at Home Phone [phone]   Caller: Patient Summary of Call: calling regarding question about  stopping Coumadin for a procedure Initial call taken by: Judie Grieve,  October 09, 2010 11:22 AM  Follow-up for Phone Call        called pt and spoke with him in regards to the not stopping of the Coumadin.  He is going to call Dr Christella Hartigan office and discuss with them the importance of a colonoscopy at his age. Dennis Bast, RN, BSN  October 09, 2010 2:30 PM

## 2010-10-17 NOTE — Medication Information (Signed)
Summary: rov/pc  Anticoagulant Therapy  Managed by: Bethena Midget, RN, BSN PCP: Sula Soda, MD Supervising MD: Riley Kill MD, Maisie Fus Indication 1: Atrial Fibrillation Lab Used: [facility] [place] Site: [address] INR POC 2.1 INR RANGE 2.0-3.0  Dietary changes: no    Health status changes: no    Bleeding/hemorrhagic complications: no    Recent/future hospitalizations: yes       Details: Pending colonoscopy on 10/25/10  Any changes in medication regimen? no    Recent/future dental: no  Any missed doses?: no       Is patient compliant with meds? yes       Allergies: 1)  ! * Pollysporin  Anticoagulation Management History:      The patient is taking warfarin and comes in today for a routine follow up visit.  Positive risk factors for bleeding include an age of 22 years or older.  The bleeding index is 'intermediate risk'.  Positive CHADS2 values include History of HTN and Age > 75 years old.  His last INR was 6.0 RATIO.  Anticoagulation responsible provider: Riley Kill MD, Maisie Fus.  INR POC: 2.1.  Cuvette Lot#: [id number].  Exp: 08/2011.    Anticoagulation Management Assessment/Plan:      The patient's current anticoagulation dose is Warfarin sodium 2.5 mg  tabs: Take as directed by anticoagulation clinic.  The target INR is 2 - 3.  The next INR is due 11/08/2010.  Anticoagulation instructions were given to patient.  Results were reviewed/authorized by Bethena Midget, RN, BSN.  He was notified by Bethena Midget, RN, BSN.         Prior Anticoagulation Instructions: INR 2.5 Continue 1 tablet everyday, except 1/2 tablet on Fridays. Recheck in 4 weeks.  Current Anticoagulation Instructions: INR 2.1 Continue 2.5mg s daily except 1.25mg s on Fridays. Recheck in 4 weeks.

## 2010-10-18 ENCOUNTER — Ambulatory Visit (AMBULATORY_SURGERY_CENTER): Payer: Medicare Other | Admitting: *Deleted

## 2010-10-18 ENCOUNTER — Encounter: Payer: Self-pay | Admitting: *Deleted

## 2010-10-18 VITALS — Ht 71.0 in | Wt 214.5 lb

## 2010-10-18 DIAGNOSIS — Z86010 Personal history of colonic polyps: Secondary | ICD-10-CM

## 2010-10-18 MED ORDER — PEG-KCL-NACL-NASULF-NA ASC-C 100 G PO SOLR: 1.0000 | Freq: Once | ORAL | Status: AC

## 2010-10-18 NOTE — Patient Instructions (Signed)
Please review patient's instructions Please pick up prescription at [facility] pharmacy

## 2010-10-24 ENCOUNTER — Encounter: Payer: Self-pay | Admitting: Gastroenterology

## 2010-10-25 ENCOUNTER — Ambulatory Visit (AMBULATORY_SURGERY_CENTER): Payer: Medicare Other | Admitting: Gastroenterology

## 2010-10-25 ENCOUNTER — Encounter: Payer: Self-pay | Admitting: Gastroenterology

## 2010-10-25 VITALS — BP 151/86 | HR 73 | Temp 97.8°F | Resp 16 | Ht 71.0 in | Wt 210.0 lb

## 2010-10-25 DIAGNOSIS — D126 Benign neoplasm of colon, unspecified: Secondary | ICD-10-CM

## 2010-10-25 DIAGNOSIS — K5730 Diverticulosis of large intestine without perforation or abscess without bleeding: Secondary | ICD-10-CM

## 2010-10-25 DIAGNOSIS — Z86010 Personal history of colonic polyps: Secondary | ICD-10-CM

## 2010-10-25 LAB — HM COLONOSCOPY

## 2010-10-25 NOTE — Patient Instructions (Signed)
Please refer to blue/green forms for discharge instructions. 

## 2010-10-26 ENCOUNTER — Telehealth: Payer: Self-pay | Admitting: *Deleted

## 2010-10-26 ENCOUNTER — Encounter: Payer: Self-pay | Admitting: *Deleted

## 2010-10-26 NOTE — Telephone Encounter (Signed)
Created in error

## 2010-10-26 NOTE — Telephone Encounter (Signed)

## 2010-10-31 NOTE — Procedures (Signed)
Summary: Colonoscopy  Patient: John [name] Note: All result statuses are Final unless otherwise noted.  Tests: (1) Colonoscopy (COL)   COL Colonoscopy           DONE      [facility]     [address]     [place], [place]  [address]          COLONOSCOPY PROCEDURE REPORT          PATIENT:  John [name], John [name]  MR#:  [id number]     BIRTHDATE:  29-Mar-1924, 87 yrs. old  GENDER:  male     ENDOSCOPIST:  Rachael Fee, MD     PROCEDURE DATE:  10/25/2010     PROCEDURE:  Diagnostic Colonoscopy     ASA CLASS:  Class II     INDICATIONS:  previous TVA with HGD, resected surgically with     right hemicolectomy 2005.  Has not had colonoscopy since.     MEDICATIONS:   Fentanyl 25 mcg IV, Versed 3 mg IV          DESCRIPTION OF PROCEDURE:   After the risks benefits and     alternatives of the procedure were thoroughly explained, informed     consent was obtained.  Digital rectal exam was performed and     revealed no rectal masses.   The [facility] CF-[id number] endoscope     was introduced through the anus and advanced to the cecum, which     was identified by both the appendix and ileocecal valve, without     limitations.  The quality of the prep was good, using MoviPrep.     The instrument was then slowly withdrawn as the colon was fully     examined.     <<PROCEDUREIMAGES>>     FINDINGS:  Two diminutive polyps were noted in colon (transverse,     descending colon) but were not removed due to ongoing coumadin use     (see image3).  The right colon was surgically resected and an     ileo-colonic anastamosis was seen (see image2).  Moderate     diverticulosis was found in the sigmoid to descending colon     segments (see image1).  This was otherwise a normal examination of     the colon (see image4).   Retroflexed views in the rectum revealed     no abnormalities.    The scope was then withdrawn from the patient     and the procedure completed.     COMPLICATIONS:  None       ENDOSCOPIC IMPRESSION:     1) Two diminutive polyps were noted but not removed due to     current coumadin use     2) Prior right hemi-colectomy     3) Moderate diverticulosis in the sigmoid to descending colon     segments     4) Otherwise normal examination          RECOMMENDATIONS:     1) Follow clinically.          ______________________________     Rachael Fee, MD          n.     eSIGNED:   Rachael Fee at 10/25/2010 11:36 AM          [name], [name], [id number]  Note: An exclamation mark (!) indicates a result that was not dispersed into the flowsheet. Document Creation Date: 10/25/2010 11:36 AM _______________________________________________________________________  (1) Order result  status: Final Collection or observation date-time: 10/25/2010 11:29 Requested date-time:  Receipt date-time:  Reported date-time:  Referring Physician:   Ordering Physician: Rob Bunting [phone]) Specimen Source:  Source: Launa Grill Order Number: [id number] Lab site:

## 2010-11-04 LAB — URINE CULTURE

## 2010-11-04 LAB — URINALYSIS, ROUTINE W REFLEX MICROSCOPIC
Bilirubin Urine: NEGATIVE
Hgb urine dipstick: NEGATIVE
Ketones, ur: NEGATIVE mg/dL
Nitrite: NEGATIVE
Urobilinogen, UA: 0.2 mg/dL (ref 0.0–1.0)
pH: 5.5 (ref 5.0–8.0)

## 2010-11-04 LAB — PROTIME-INR
INR: 2.1 — ABNORMAL HIGH (ref 0.00–1.49)
INR: 2.2 — ABNORMAL HIGH (ref 0.00–1.49)
Prothrombin Time: 23.5 seconds — ABNORMAL HIGH (ref 11.6–15.2)
Prothrombin Time: 23.7 seconds — ABNORMAL HIGH (ref 11.6–15.2)
Prothrombin Time: 25.2 seconds — ABNORMAL HIGH (ref 11.6–15.2)

## 2010-11-04 LAB — BASIC METABOLIC PANEL
BUN: 15 mg/dL (ref 6–23)
BUN: 16 mg/dL (ref 6–23)
BUN: 18 mg/dL (ref 6–23)
CO2: 26 mEq/L (ref 19–32)
Calcium: 8.8 mg/dL (ref 8.4–10.5)
Calcium: 9.2 mg/dL (ref 8.4–10.5)
Chloride: 105 mEq/L (ref 96–112)
Chloride: 106 mEq/L (ref 96–112)
Creatinine, Ser: 0.99 mg/dL (ref 0.4–1.5)
Creatinine, Ser: 1 mg/dL (ref 0.4–1.5)
GFR calc Af Amer: >60 mL/min (ref >60)
GFR calc non Af Amer: >60 mL/min (ref >60)
Glucose, Bld: 106 mg/dL — ABNORMAL HIGH (ref 70–99)
Glucose, Bld: 142 mg/dL — ABNORMAL HIGH (ref 70–99)
Potassium: 3.9 mEq/L (ref 3.5–5.1)
Potassium: 4.1 mEq/L (ref 3.5–5.1)

## 2010-11-04 LAB — CARDIAC PANEL(CRET KIN+CKTOT+MB+TROPI)
CK, MB: 2.6 ng/mL (ref 0.3–4.0)
CK, MB: 2.7 ng/mL (ref 0.3–4.0)
Relative Index: INVALID (ref 0.0–2.5)
Total CK: 90 U/L (ref 7–232)
Troponin I: 0.01 ng/mL (ref 0.00–0.06)
Troponin I: 0.01 ng/mL (ref 0.00–0.06)

## 2010-11-04 LAB — CBC
HCT: 42.3 % (ref 39.0–52.0)
HCT: 46.2 % (ref 39.0–52.0)
Hemoglobin: 14.2 g/dL (ref 13.0–17.0)
MCHC: 33.6 g/dL (ref 30.0–36.0)
MCV: 87 fL (ref 78.0–100.0)
MCV: 88.5 fL (ref 78.0–100.0)
MCV: 88.6 fL (ref 78.0–100.0)
Platelets: 63 10*3/uL — ABNORMAL LOW (ref 150–400)
Platelets: 63 10*3/uL — ABNORMAL LOW (ref 150–400)
Platelets: 63 10*3/uL — ABNORMAL LOW (ref 150–400)
RBC: 5.09 MIL/uL (ref 4.22–5.81)
RDW: 16 % — ABNORMAL HIGH (ref 11.5–15.5)
RDW: 16 % — ABNORMAL HIGH (ref 11.5–15.5)
WBC: 8.6 10*3/uL (ref 4.0–10.5)

## 2010-11-04 LAB — URINE MICROSCOPIC-ADD ON

## 2010-11-04 LAB — DIFFERENTIAL
Basophils Absolute: 0 10*3/uL (ref 0.0–0.1)
Eosinophils Absolute: 0.1 10*3/uL (ref 0.0–0.7)
Eosinophils Relative: 1 % (ref 0–5)
Monocytes Absolute: 0.6 10*3/uL (ref 0.1–1.0)

## 2010-11-04 LAB — POCT CARDIAC MARKERS
CKMB, poc: 1.5 ng/mL (ref 1.0–8.0)
Troponin i, poc: <0.05 ng/mL (ref 0.00–0.09)

## 2010-11-04 LAB — CK TOTAL AND CKMB (NOT AT ARMC)
CK, MB: 3.3 ng/mL (ref 0.3–4.0)
Total CK: 88 U/L (ref 7–232)

## 2010-11-04 LAB — TECHNOLOGIST SMEAR REVIEW: Path Review: DECREASED

## 2010-11-04 LAB — TROPONIN I: Troponin I: 0.01 ng/mL (ref 0.00–0.06)

## 2010-11-07 ENCOUNTER — Ambulatory Visit (INDEPENDENT_AMBULATORY_CARE_PROVIDER_SITE_OTHER): Payer: Medicare Other | Admitting: *Deleted

## 2010-11-07 DIAGNOSIS — I4891 Unspecified atrial fibrillation: Secondary | ICD-10-CM

## 2010-11-07 DIAGNOSIS — Z7901 Long term (current) use of anticoagulants: Secondary | ICD-10-CM

## 2010-11-07 NOTE — Patient Instructions (Signed)
INR 2.3 Continue taking 1 tablet (2.5mg ), except take 1/2 tablet (1.25mg ) on Fridays. Recheck in 4 weeks.

## 2010-11-08 ENCOUNTER — Encounter: Payer: BC Managed Care – PPO | Admitting: *Deleted

## 2010-11-22 ENCOUNTER — Other Ambulatory Visit: Payer: Self-pay | Admitting: Hematology & Oncology

## 2010-11-22 ENCOUNTER — Encounter (HOSPITAL_BASED_OUTPATIENT_CLINIC_OR_DEPARTMENT_OTHER): Payer: Medicare Other | Admitting: Hematology & Oncology

## 2010-11-22 DIAGNOSIS — D696 Thrombocytopenia, unspecified: Secondary | ICD-10-CM

## 2010-11-22 DIAGNOSIS — D6949 Other primary thrombocytopenia: Secondary | ICD-10-CM

## 2010-11-22 LAB — CBC WITH DIFFERENTIAL (CANCER CENTER ONLY)
BASO%: 0.3 % (ref 0.0–2.0)
EOS%: 3.1 % (ref 0.0–7.0)
HCT: 47.7 % (ref 38.7–49.9)
LYMPH%: 18.7 % (ref 14.0–48.0)
MCHC: 33.3 g/dL (ref 32.0–35.9)
MCV: 89 fL (ref 82–98)
NEUT%: 69.6 % (ref 40.0–80.0)
Platelets: 104 10*3/uL — ABNORMAL LOW (ref 145–400)
RDW: 13.8 % (ref 11.1–15.7)

## 2010-11-22 LAB — CHCC SATELLITE - SMEAR

## 2010-12-05 ENCOUNTER — Ambulatory Visit (INDEPENDENT_AMBULATORY_CARE_PROVIDER_SITE_OTHER): Payer: Medicare Other | Admitting: *Deleted

## 2010-12-05 DIAGNOSIS — I4891 Unspecified atrial fibrillation: Secondary | ICD-10-CM

## 2010-12-05 DIAGNOSIS — Z7901 Long term (current) use of anticoagulants: Secondary | ICD-10-CM

## 2010-12-12 NOTE — Assessment & Plan Note (Signed)
[facility]                         ELECTROPHYSIOLOGY OFFICE NOTE   CALHOUN, REICHARDT                       MRN:          [id number]  DATE:12/25/2006                            DOB:          1924/01/26    John [name] is referred today by Dr. Glennon Hamilton for consultation for  evaluation and recommendations for treatment of his atrial fibrillation.   HISTORY OF PRESENT ILLNESS:  The patient is a very pleasant 77 year old  man, who has a history of coronary artery disease status post bypass  surgery in 1995.  He has a history of perioperative atrial fibrillation  after colectomy and has recently developed spontaneous atrial  fibrillation.  The patient was seen in the office by Dr. Corinda Gubler 2 weeks  ago and was found to have recurrent atrial fibrillation.  He had no  symptoms of his atrial fibrillation.  He was started on Coumadin.  So  far, he has had some difficulty with Coumadin levels.  The patient  denies chest pain.  He denies shortness of breath, and he remains  relatively active.  He has had no syncope.  His cardiac history is that  he has had a history of preserved LV function by echocardiogram in the  past.   MEDICATIONS:  1. Avalide/HCTZ 300/12.5.  2. Multivitamin.  3. Protonix 40 mg daily.  4. Flomax.  5. Pravachol 40 daily.  6. Norvasc 5 daily.  7. Toprol 50 daily.  8. Coumadin.   His additional past medical history is notable for hypertension and  hypertriglyceridemia.  He has a history of TIAs in the past.  His 2 D  echocardiogram done today demonstrates preserved left ventricular  systolic function with mild left atrial dilatation and mild aortic valve  thickness.  His family history is notable for coronary disease and a  history of stroke, with his father dying of stroke as well as an uncle  dying of stroke in their 87s.  His review of systems is as noted in the  HPI.  Otherwise, all symptoms reviewed and found to be  negative.   PHYSICAL EXAMINATION:  GENERAL:  He is a pleasant, well-appearing  elderly man in no distress.  VITAL SIGNS:  His blood pressure today was 108/70.  The pulse was 68 and  irregular.  The respirations were 18, and the weight was 222 pounds.  HEENT:  Normocephalic, atraumatic.  The pupils equal and round.  The  oropharynx was moist, the sclerae anicteric.  NECK:  No jugular venous distention.  There is no thyromegaly.  Trachea  is midline.  The carotids are 2+ and symmetric.  LUNGS:  Clear bilaterally to auscultation.  No wheezes, rales, or  rhonchi.  There is no increased work of breathing.  CARDIOVASCULAR:  Irregular rhythm with normal S1 and S2.  EXTREMITIES:  No cyanosis, clubbing, or edema.  Pulses were 2+ and  symmetric.  NEUROLOGIC:  Alert and oriented x2.  His cranial nerves are intact.  The  strength is 5/5 and symmetric.   IMPRESSION:  1. Relatively new onset atrial fibrillation.  2. Chronic Coumadin therapy.  3.  Ischemic heart disease with positive stress test.  4. Hypertension.  5. New onset Coumadin therapy.   DISCUSSION:  I recommend that John [name] be maintained on rate control  therapy and Coumadin at present.  He is instructed to continue his  regular activities.  We will plan to see him back in the office in 3  months.  He will continue on his Coumadin clinic, and we spent a large  amount of time today talking about the importance of maintaining  Coumadin therapy for thromboembolic prevention.  He has had no recurrent  chest pain at present, but he is instructed to call us if he does.  We  will plan to see the patient back in 3 months.     Doylene Canning. Ladona Ridgel, MD  Electronically Signed    GWT/MedQ  DD: 12/25/2006  DT: 12/25/2006  Job #: [id number]

## 2010-12-12 NOTE — Assessment & Plan Note (Signed)
[facility]                         ELECTROPHYSIOLOGY OFFICE NOTE   NEELY, CECENA                       MRN:          [id number]  DATE:03/19/2008                            DOB:          06/02/1924    John [name] returns today for followup.  He is a very pleasant 77-year-  old man with a history of ischemic heart disease as well as atrial  fibrillation.  He has preserved LV function.  He has very minimal heart  failure class I to II at most.  He denies palpitations or chest pain.  He does not know if he has AFib, he cannot feel it.  Otherwise, had no  specific complaints today.  He exercises once every other day or so as  he says for 15 or 20 minutes.   MEDICATIONS:  His medications include:  1. Avalide 300/12.5 daily.  2. Multiple vitamin.  3. Protonix 40 a day.  4. Flomax 0.4 a day.  5. Pravastatin 40 a day.  6. Norvasc 2.5 a day.  7. Metoprolol 50 a day.  8. Warfarin 2.5 mg as directed.  9. Prilosec 40 a day.   PHYSICAL EXAMINATION:  GENERAL:  On exam, he is a pleasant, well-  appearing elderly man in no distress.  VITAL SIGNS:  The blood pressure was 160/90, the pulse was 82 and  regular, respirations were 18.  Weight was 224 pounds.  NECK:  Revealed no jugular venous distention.  LUNGS:  Clear bilaterally to auscultation.  No wheezes, rales, or  rhonchi are present.  CARDIOVASCULAR:  Irregular rate and rhythm.  Normal S1 and S2.  EXTREMITIES:  There is trace peripheral edema.   EKG demonstrates atrial fibrillation with controlled ventricular  response.   IMPRESSION:  1. Ischemic heart disease, which is currently asymptomatic.  2. Chronic atrial fibrillation, also asymptomatic.  3. Hypertension.   DISCUSSION:  Overall, John [name] blood pressure is not well controlled  today, but otherwise he is stable.  He had no anginal symptoms and no  heart failure symptoms.  I have encouraged  him to increase his activity and to maintain  a low-sodium diet and to  take his cardiac medications.  I will see him back in 6 months.     Doylene Canning. Ladona Ridgel, MD  Electronically Signed    GWT/MedQ  DD: 03/19/2008  DT: 03/20/2008  Job #: [id number]

## 2010-12-12 NOTE — Assessment & Plan Note (Signed)
[facility]                         GASTROENTEROLOGY OFFICE NOTE   John [name], John [name]                       MRN:          [id number]  DATE:07/28/2007                            DOB:          17-May-1924    PRIMARY SURGEON:  Adolph Pollack, M.D.   PRIMARY CARE PHYSICIAN:  Georgina Quint. Plotnikov, M.D.   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 Underwent surgical resection October 2005, lap  assisted right hemicolectomy. 9/9 lymph nodes were negative for  carcinoma.   INTERVAL HISTORY:  [name] was last seen here by Dr. Victorino Dike in  2006. At that point, Dr. Doreatha Martin recommended he have a surveillance  colonoscopy later that year. [name] has not yet proceeded with that  due to other health concerns and issues with his family. He has had no  symptoms of rectal bleeding, no abdominal discomforts. He was recently  started on Coumadin for new onset atrial fibrillation approximately 6-8  months ago. He is currently under the care of Dr. Sharrell Ku from  [facility].   CURRENT MEDICATIONS:  1. Avalide.  2. Multivitamin.  3. Protonix once daily.  4. Flomax.  5. Pravastatin.  6. Norvasc.  7. Coumadin.   PHYSICAL EXAMINATION:  Weight is 224 pounds, blood pressure 142/82,  pulse 68 and regular.  CONSTITUTIONAL:  Generally well-appearing.  NEUROLOGIC:  Alert and oriented x3.  ABDOMEN:  Soft, nontender, nondistended, normal bowel sounds.   ASSESSMENT/PLAN:  An 77 year old man with previous tubulovillous adenoma  with high-grade dysplasia resected surgically October 2005.   I discussed the risks and benefits to proceed with repeat colonoscopy  and [name] has agreed to proceed. He is currently on Coumadin and he  discussed holding this with Dr. Ladona Ridgel at his previous visit and Dr.  Ladona Ridgel said holding it 5 days seemed reasonable. We will communicate  with Dr. Ladona Ridgel directly to make  sure that he is okay with that as there  can be some restrictions to holding Coumadin in relatively new onset  atrial fibrillation. Mr. Apo does not want to pick the date yet. He  instead wants to talk with his family about getting a ride that day as  well as meeting in with Dr. Ladona Ridgel which he is already scheduled to do  in the next 2-3 weeks. I see no reason for  any further blood tests of imaging studies. I have also refilled proton  pump inhibitor for him that he knows to take once daily.     Rachael Fee, MD  Electronically Signed    DPJ/MedQ  DD: 07/28/2007  DT: 07/28/2007  Job #: [id number]   cc:   Georgina Quint. Plotnikov, MD  Doylene Canning. Ladona Ridgel, MD  Adolph Pollack, M.D.

## 2010-12-12 NOTE — Assessment & Plan Note (Signed)
[facility]                         ELECTROPHYSIOLOGY OFFICE NOTE   KAISON, MCPARLAND                       MRN:          [id number]  DATE:08/25/2007                            DOB:          06-15-1924    John [name] returns today for followup.  He is very pleasant 77 year old  male with ischemic heart disease and preserved LV function who is status  post bypass surgery in the past.  He has hypertension and dyslipidemia.  He has atrial fibrillation and returns today for follow-up.  He denies  any particular problems at present except he admits to having a fairly  sedentary lifestyle.  The patient states that he has been told his  microscopic hematuria and had a CT scan which was unremarkable and  otherwise workup is ongoing.  He is scheduled for colonoscopy because of  a history of colon cancer.  Today his medications include Avalide,  multivitamin, Protonix, Flomax 0.4 mg daily, Pravachol 40 a day, Norvasc  2.5 daily, warfarin 2.5 daily and metoprolol 50 daily.   On exam he is a pleasant well-appearing 77 year old man in no acute  distress.  Blood pressure was 150/90, the pulse was 74 and regular,  respirations were 18, weight was 223.  NECK:  Revealed no jugular venous  distention.  LUNGS:  Clear bilaterally to auscultation.  No wheezes, rales or rhonchi  are present.  CARDIAC EXAM regular rate and rhythm.  Normal S1-S2.  EXTREMITIES:  Demonstrate no edema.   EKG today demonstrates atrial fibrillation with controlled ventricular  response.   IMPRESSION:  1. Ischemic heart disease with preserved LV function.  2. Chronic atrial fibrillation.  3. Chronic Coumadin therapy.   DISCUSSION:  Overall Mr. Dyches is stable.  I noted that his HDL  cholesterol was low at his last blood check.  I have asked that he  reemphasize his goal for more walking as he may well require Niaspan but  I would like to hold off on this and repeat his fasting lipids in  approximately 6 months when I see him  back in clinic.     Doylene Canning. Ladona Ridgel, MD  Electronically Signed    GWT/MedQ  DD: 08/25/2007  DT: 08/25/2007  Job #: [id number]

## 2010-12-12 NOTE — Discharge Summary (Signed)
NAME:  John [name], John [name] NO.:  [id number]   MEDICAL RECORD NO.:  [id number]          PATIENT TYPE:  INP   LOCATION:  1424                         FACILITY:  [facility]   PHYSICIAN:  Corwin Levins, MD      DATE OF BIRTH:  1923/09/14   DATE OF ADMISSION:  07/14/2008  DATE OF DISCHARGE:  07/18/2008                               DISCHARGE SUMMARY   DISCHARGE DIAGNOSES:  1. Pneumonia multilobar to the left upper lobe and right lower lobe.  2. Loose stools of unclear etiology.  3. Transient hypokalemia.  4. Transient hyponatremia.  5. Mild hyperglycemia.  6. Hypertension.  7. Atrial fibrillation.  8. Mildly elevated LFTs felt to be reactive.  9. History of coronary artery disease.  10.Dyslipidemia.  11.Chronic anticoagulation use.  12.History of colon cancer.  13.History of peripheral vascular disease.  14.History of transient ischemic attack x2.  15.History of myocardial infarction.  16.History of diverticulitis.  17.Benign prostatic hypertrophy.  18.Iron deficiency anemia history.  19.Peripheral vascular disease with small abdominal aortic aneurysm      and bilateral carotid mild disease.  20.History of lower gastrointestinal diverticular bleed.  21.History of multiple surgeries as per past surgical history in the      HPI on date of admission.   PROCEDURES:  Abdomen ultrasound December 17 was somewhat limiting study,  hepatic and renal cysts, probable medical renal disease and otherwise no  acute findings.   CONSULTATIONS:  None.   HISTORY AND PHYSICAL:  See that documented per EMR charting as per the  day of admission per Dr. Yetta Barre.   HOSPITAL COURSE:  John [name] is an 77 year old white male who was  admitted with fever and questionable impending sepsis, questionable  prostatitis but this proved to be essentially negative.  He was begun on  IV Rocephin for this but IV azithromycin was added later when it was  shown on initial chest x-ray that he had left  upper lobe pneumonia.  Subsequent chest x-ray revealed right lower lobe infiltrate as well.  Over the next 3 days on antibiotic as above he did quite nicely.  White  blood cell count remained within normal limits.  He did have some  transient hyponatremia, hyperglycemia and hypokalemia but these were  managed successfully and were minor problems.  His INR remained stable  throughout with a discharge INR of 2.2.  He remained stable otherwise  without evidence for congestive heart failure or any rapid ventricular  reactivity of his atrial fibrillation.  There were some transient mild  LFTs as well which were felt to be reactive secondary to his right lower  lobe infiltrate.  Ultrasound was essentially negative as above.  As he  had regained his stamina, was oxygenating well, vital signs stable he  was felt to have gained maximal benefit from this hospitalization and is  to be discharged home at this time.  There was evidence of a few rales  bibasilar at the time of discharge as well as low potassium of 2.9 but  this was felt to represent atelectasis and potassium can be corrected  p.o.  There were some loose stools on the night prior to discharge that  were felt likely secondary to the Rocephin.  There was no abdominal  pain, nausea, vomiting, no bleeding and no blood noted although it was  not Hemoccult tested.  Hemoglobin remained stable at the time of  discharge, however.   DISPOSITION:  Discharged to home in good condition.  There are no  activity or dietary restrictions except for heart-healthy diet as  before.  He is to follow up with Dr. Yetta Barre who he considers his new  primary care physician in 1-2 weeks.   DISCHARGE MEDICATIONS:  1. To include Levaquin 750 mg p.o. daily for 5 days.  2. As well as all medications prior to admission which include      metoprolol ER 50 mg p.o. daily.  3. Pravastatin 40 mg p.o. daily.  4. Protonix 40 mg p.o. daily.  5. Warfarin 2.5 mg p.o. daily.   6. Flomax 0.4 mg one p.o. daily.  7. Avalide 300/12.5 one p.o. daily.  8. Norvasc 2.5 mg p.o. daily.  9. Multivitamin one p.o. daily.      Corwin Levins, MD  Electronically Signed     JWJ/MEDQ  D:  07/18/2008  T:  07/18/2008  Job:  [id number]

## 2010-12-12 NOTE — Assessment & Plan Note (Signed)
[facility]                            CARDIOLOGY OFFICE NOTE   John, [name]                       MRN:          [id number]  DATE:12/12/2006                            DOB:          04-11-1924    HISTORY OF PRESENT ILLNESS:  [name] is a very pleasant 77-year-  old white married male with long history of coronary artery disease as  described in note of July 17, 2006.  He had stress Myoview in October 19, 2006, revealing fatigue and chest discomfort at 4 minutes and 30  seconds and STT abnormality inferior scarring was superimposed with  ischemia.   After conferring with Dr. Juanda Chance, we suggested cardioangiography.  However, the patient has been reluctant to proceed at this time, as he  is having no symptoms with his general activity.  He has had upper  respiratory infection over the past week or so.  He notes that his pulse  has been somewhat fasted since we decreased the Toprol XL 50.   MEDICATIONS:  1. Avalide 300/12.5.  2. Baby aspirin.  3. Plavix 75 which he has taken since his Cypher stent in October 2005      per Dr. Gerri [name].  4. Protonix 40.  5. Flomax 0.4.  6. Pravastatin 40.  7. Norvasc 5.  8. Toprol XL 25.   PHYSICAL EXAMINATION:  VITAL SIGNS:  Blood pressure 120/70, pulse 85 in  atrial fibrillation.  This is new.  GENERAL:  Normal.  JVP is not elevated.  Carotid pulses palpable without  bruits.  LUNGS:  Clear.  CARDIAC:  Atrial fibrillation.  No murmur or gallop.  ABDOMEN:  Unremarkable.  EXTREMITIES:  No edema.   STUDIES:  EKG reveals atrial fibrillation with ventricular rate of 95.   DIAGNOSES:  1. New onset atrial fibrillation, probably the past few weeks when he      has upper respiratory symptoms and noted the heart rate increasing.  2. Coronary artery disease, previous CBG by Dr. Andrey [name] 13 years ago      with LIMA to LAD, SV to RCA.  Early passive Myoview.  3. Colon polyps  4. Hypertension.  5.  Transient ischemic attack in 1999.   PLAN:  I have suggested that the patient start on Coumadin.  Will need  to discontinue the Plavix when he is therapeutic.   I reviewed him with Dr. Ladona [name] who will continue his cardiology care and  make a decision concerning treatment and possible DC cardioversion.  John [name] will see him in the Coumadin Clinic.  In the interim, we  plan a 2D echo, lipids, LFTs, BMP and TSH.     E. Graceann Congress, MD, [facility]     EJL/MedQ  DD: 12/12/2006  DT: 12/12/2006  Job #: [id number]

## 2010-12-12 NOTE — Assessment & Plan Note (Signed)
[facility]                         ELECTROPHYSIOLOGY OFFICE NOTE   ALEEM, ELZA                       MRN:          [id number]  DATE:03/26/2007                            DOB:          Oct 30, 1923    The patient returns today for follow-up.  He is a very pleasant 77-year-  old man with ischemic heart disease, mild LV dysfunction, atrial  fibrillation and chronic Coumadin therapy who returns today for follow-  up.  The patient, in the interim since I saw  him last back in May, has  done well.  He has minimal symptoms of heart failure and no anginal  symptoms.  His atrial fibrillation has been well controlled.  He denies  chest pain.  He denies shortness of breath.  He is tolerating his  Coumadin very nicely and continues to exercise regularly.   MEDICATIONS:  1. Avalide 300/12.5 daily.  2. Multivitamin.  3. Protonix 40 a day.  4. Flomax 0.4 daily.  5. Pravastatin 40 a day.  6. Norvasc 2.5 daily.  7. Toprol XL 50 a day.  8. He is on Coumadin as directed.   PHYSICAL EXAMINATION:  VITAL SIGNS:  Blood pressure was 130/78, pulse 72  and irregular, respirations 18.  GENERAL APPEARANCE:  A pleasant well-appearing elderly man in no acute  distress.  NECK:  No jugular venous distension.  No thyromegaly.  LUNGS:  Clear to auscultation bilaterally.  No wheezing, rhonchi or  rales present.  CARDIOVASCULAR:  Irregular rhythm with normal S1 and S2.  No murmurs,  rubs, or gallops appreciated.  EXTREMITIES:  No clubbing, cyanosis, or edema.  Pulses were 2+ and  symmetric.   The EKG demonstrates atrial fibrillation with controlled ventricular  response.   IMPRESSION:  1. Ischemic heart disease status post bypass surgery.  2. Atrial fibrillation.  3. Chronic Coumadin therapy.  4. Hypertension.   DISCUSSION:  Overall, the patient is stable and his atrial fibrillation  is well controlled.  I will plan to see the patient back in the office  in  several months.    Doylene Canning. Ladona Ridgel, MD  Electronically Signed   GWT/MedQ  DD: 03/26/2007  DT: 03/27/2007  Job #: [id number]

## 2010-12-12 NOTE — Discharge Summary (Signed)
NAME:  John [name], John [name] NO.:  [id number]   MEDICAL RECORD NO.:  [id number]          PATIENT TYPE:  INP   LOCATION:  3731                         FACILITY:  [facility]   PHYSICIAN:  Lonia Blood, M.D.       DATE OF BIRTH:  May 14, 1924   DATE OF ADMISSION:  02/28/2009  DATE OF DISCHARGE:  03/02/2009                               DISCHARGE SUMMARY   PRIMARY CARE PHYSICIAN:  Georgina Quint. Plotnikov, MD   PRIMARY CARDIOLOGIST:  Doylene Canning. Ladona Ridgel, MD   DISCHARGE DIAGNOSES:  1. Syncope, unclear etiology - workup in progress.  2. Coronary artery disease, status post coronary artery bypass      grafting in 1995, status post stenting to the distal anastomosis of      saphenous vein graft to right coronary artery in 2005.  3. Persistent atrial fibrillation, on chronic Coumadin.  4. Benign prostatic hyperplasia.  5. Left hip hemiarthroplasty.  6. Hypertension with orthostasis.  7. Thrombocytopenia of unclear cause - referred for outpatient      Hematology consult.  8. Bacteriuria without symptoms and with culture growing less than      10,000 pathogens.   DISCHARGE MEDICATIONS:  1. Metoprolol 50 mg daily.  2. Avapro 300 mg daily.  3. Flomax 0.4 mg daily.  4. Aldactone 50 mg daily.  5. Omeprazole 40 mg daily.  6. Coumadin 2.5 mg daily with exception of Friday and 1.25 mg on      Friday.  7. Pravastatin 40 mg daily.  8. Vitamin D 1000 units daily.   CONDITION ON DISCHARGE:  John [name] is discharged in good condition,  neurologically intact with stable vital signs.   The patient will follow up with Dr. Lewayne Bunting in the [facility]  Cardiology Office for a Myoview stress test.  The patient will also  follow up at the [facility] with Dr. Arbutus Ped on March 09, 2009, at  1:30 p.m. for evaluation of his thrombocytopenia.   HISTORY AND PHYSICAL:  Refer to the written H and P, which was done by  Dr. Rene Paci on February 28, 2009.   CONSULTATION DURING THIS ADMISSION:  The  patient was seen in  consultation by Dr. Sherryl Manges and Dr. Lewayne Bunting from Cardiology.   PROCEDURE DURING THIS ADMISSION:  The patient underwent a transthoracic  echocardiogram with findings of preserved ejection fraction without any  areas of left ventricular hypokinesis or akinesis.   HOSPITAL COURSE:  John [name] is an 77 year old gentleman with history of  coronary artery disease and hypertension, who presented with a syncope  of unclear etiology.  The patient was observed for 48 hours in the  hospital without any arrhythmic events noted.  The patient ambulated  without any difficulties to floors, and was seen by the Cardiology  Service, who offered him inpatient Myoview.  The patient elected to  proceed with outpatient Myoview.  We have carefully monitored that Mr.  [name]'s blood pressure during the hospital stay with noticing that at  times it was very elevated, but also at other times, it was on the low  side to illustrate the point on March 01, 2009, at 6 a.m., the blood  pressure was 164/108, but on August 3 at 2 p.m., was 134/82.  This  brought up the point that we should probably not add fifth  antihypertensive medication as we might be precipitating syncopal events  in this patient with labile hypertension.  During this hospitalization,  we also noted that the patient was having thrombocytopenia.  We cannot  come up with a clear explanation why this was happening as he was not on  any offending medications.  The patient was not anemic, and his white  blood cell count was within normal limits.  He was not displaying any  bleeding diathesis, and his platelet count remained stable during this  hospitalization.  His smear was reviewed and it showed large platelets,  but no blasts.  Mr. Sandiford was referred for an outpatient Hematology  consultation to see if any further workup is required.  At the time of  this dictation, the last study that was ordered during this   hospitalization, which was an electroencephalogram is pending.      Lonia Blood, M.D.  Electronically Signed     SL/MEDQ  D:  03/02/2009  T:  03/03/2009  Job:  [id number]   cc:   Georgina Quint. Plotnikov, MD  Doylene Canning. Ladona Ridgel, MD  Lajuana Matte, MD

## 2010-12-12 NOTE — Procedures (Signed)
This is an 77 year old gentleman.  The EEG is performed on March 02, 2009 at 9:30 a.m. and is interpreted within the hour.  This is a routine  EEG for a gentleman, who is described as right-handed awake and drowsy  and was exposed to hyperventilation and photic stimulation during the 16-  channel EEG recording.   Current medications include Toprol, Zocor Coumadin, Flomax, Benicar,  vitamin D, Aldactone, Tylenol, Plavix, and Protonix.   This gentleman was admitted due to a syncopal episode lasting  approximately 4 minutes.  Event was witnessed, but no body jerking was  noted.  No incontinence.  The patient has a history of are atrial  fibrillation and coronary artery disease, underwent CABG about 15 years  ago and he has a history of hypertension.  The differential of seizure  versus mini stroke was raised.   DESCRIPTION:  A posterior dominant background rhythm is established at 9  Hz and emit symmetrically from both posterior hemispheres.  The patient  appears to be drowsy for much of the study, but follows simple commands  such as eye opening, eye closing.  He has an irregular heartbeat.  His  EKG shows very variable QRS interval time.  Photic stimulation was  performed and at flash rates of 7, 9, 11, and 13 rates.  There is a  photic driving noted.  No epileptiform activity results from it.  Again,  the EKG electrode shows very variable R to R intervals.  Sleep was not  reached, but drowsiness was described and shows fairly symmetric  architecture throughout these various stages of awareness.   CONCLUSION:  This is a normal EEG for the patient's age and conscious  state.  A presumed focal slowing initially seen over the T3-T5 electrode  which represent the left temporal area that appeared to be in artifact.      Melvyn Novas, M.D.  Electronically Signed     GN:FAOZ  D:  03/02/2009 11:11:07  T:  03/03/2009 01:04:52  Job #:  [id number]

## 2010-12-15 NOTE — Assessment & Plan Note (Signed)
[facility]                            CARDIOLOGY OFFICE NOTE   MAVRYK, PINO                       MRN:          [id number]  DATE:11/12/2006                            DOB:          1924/07/13    Mr. Mcclenton is a very pleasant 77 year old white married male followed in  this office for more than 60 years.  He has a long history of coronary  artery disease as described in the note of July 17, 2006.  He had a  rest stress Myoview on October 19, 2006, revealing fatigue and chest pain  at 4 minutes 30 seconds of the Bruce protocol.  He had significant ST T  abnormality.  The Myoview revealed inferior scarring with some  superimposed ischemia.  After reviewing the study with Dr. Juanda Chance, we  felt that coronary angiography would be indicated.   The patient has been hesitant to proceed at this time and thus a cath  has not been done as yet.  We discussed it further today and he would  like to wait another month.  He has had no recent symptoms.   His most recent cath by Dr. Gerri Spore May 22, 2004, revealed patent  LIMA to diagonal LAD, patent SVG to RCA with a 70-80% lesion at the  anastomosis.   There was collateral filling of the distal circ on the right.  The EF  was normal.   MEDICATIONS:  1. Avalide 300/12.5.  2. Aspirin 81.  3. Toprol-XL 50.  4. Plavix 75.  5. Protonix 40.  6. Flomax 0.4.  7. Pravastatin 40.   The patient has also had some hypertension and hyperlipidemia in the  past.   Blood pressure today is 184/94, this was repeated and was similar, pulse  68, normal sinus rhythm.  GENERAL APPEARANCE:  Normal.  JVP:  Not elevated.  CAROTID PULSES:  Palpable and equal without bruits.  LUNGS:  Clear.  HEART EXAM:  Normal.  No murmur or gallop.  ABDOMINAL EXAM:  Normal.  EXTREMITIES:  No edema.   EKG revealed sinus arrhythmia, nonspecific ST-T changes.   IMPRESSION:  1. Coronary disease 13 years post coronary artery  bypass graft by Dr.      Particia Lather with abnormal stress test.  Angiography has been      recommended, the patient has been hesitant.  2. Poorly controlled hypertension.   Suggest starting Norvasc 5.  He will come back for blood pressure check  and I will see him in 3-4 weeks.  He would like to wait until that time  to make decisions concerning the coronary angiogram.     E. Graceann Congress, MD, [facility]  Electronically Signed    EJL/MedQ  DD: 11/12/2006  DT: 11/12/2006  Job #: [id number]

## 2010-12-15 NOTE — Consult Note (Signed)
NAME:  John [name], John [name] NO.:  [id number]   MEDICAL RECORD NO.:  [id number]          PATIENT TYPE:  INP   LOCATION:  0152                         FACILITY:  [facility]   PHYSICIAN:  Pricilla Riffle, M.D.    DATE OF BIRTH:  [birth year]   DATE OF CONSULTATION:  07/06/2004  DATE OF DISCHARGE:                                   CONSULTATION   IDENTIFICATION:  Mr. Obryan is a 77 year old gentleman who has a history of  coronary artery disease, was recently discharged from the hospital after a  non-Q-wave MI.  On this admission he had a cardiac catheterization that  showed 80% distal anastomosis lesion of the saphenous vein graft to the RCA.  LIMA to the LAD was noted to be patent.  Circumflex had 100% occlusion.  LV  function was normal.  The patient underwent coronary artery stenting with a  Cypher stent on May 26, 2004.  He was discharged home on Plavix 75 mg  and aspirin 325 mg.  The patient decreased the aspirin to 162 mg on his own  after about 1 week.  Since discharge, the patient denies chest pain,  shortness of breath, palpitations.   Last evening he developed the sudden onset of bright red blood per rectum  and had three episodes.  On admission, his hemoglobin was 10.9, today at  8.9.  He is currently receiving 1 of 2 units of blood.  Again, denies chest  pain, no shortness of breath, notes some dizziness with standing. Last  Plavix and aspirin were taken on December 7.   ALLERGIES:  None.   MEDICATIONS PRIOR TO ADMISSION:  1.  Plavix 75 mg daily.  2.  Aspirin 162 mg daily.  3.  Proscar daily.  4.  Flomax daily.  5.  Toprol-XL 50 daily.  6.  Pravachol 20 daily.   PAST MEDICAL HISTORY:  1.  Coronary artery disease status post CABG in 1995, last angiogram with      stent placement October 28.  The patient had a non-Q-wave MI after      surgery.  2.  History of diverticulosis.  3.  History of tubular adenoma status post right hemicolectomy May 09, 2004.  4.  History of postoperative rapid atrial fibrillation.  5.  History of spontaneous pneumothorax.  6.  History of DJD.   SOCIAL HISTORY:  The patient lives in [place], is married, quit tobacco  x4 years, does not drink.   FAMILY HISTORY:  Noncontributory.   REVIEW OF SYSTEMS:  Overall systems reviewed.  Negative to the above problem  except as noted previously.   PHYSICAL EXAMINATION:  GENERAL:  On exam, the patient currently in no acute  distress.  VITAL SIGNS:  Blood pressure 110s to 120s systolic, pulse is in the 80s  (sinus rhythm with occasional PVCs), respiratory rate 18, O2 saturation 2 L  is 100%.  NECK:  JVP is normal, no bruits.  LUNGS:  Clear.  CARDIAC:  Regular rate and rhythm. S1, S2.  No S3, S4.  Grade 1/6 systolic  murmur heard best at the  left sternal border radiating to the apex.  ABDOMEN:  Shows minimal to mild diffuse tenderness, no rebound.  EXTREMITIES:  Good distal pulses, no lower extremity edema.   A 12-lead EKG shows sinus rhythm at a rate of 85 beats per minute with  occasional PVCs.   LABORATORY DATA:  Significant for a hemoglobin of 8.9.  BUN and creatinine  of 18 and 1, potassium of 3.4 (the patient has received 40 mEq of potassium  for this).  Cholesterol of 141, HDL of 32, LDL of 90, triglycerides of 97.   IMPRESSION:  1.  Lower gastrointestinal bleeding.  The patient is currently receiving 2      units of packed red cells for this, has not had any bleeding since he      has been in the ICU.  2.  Coronary artery disease status post non-Q-wave myocardial infarction,      status post Cypher stent to the saphenous vein graft to the right      coronary artery May 26, 2004.  I have discussed Mr. Sthilaire      situation with Daisey Must (interventionalist) who was involved in      his care in October.  I have also discussed his situation with Dr. Marina Goodell      and Dr. Victorino Dike.  Unfortunately, with the Cypher stent, especially       after an acute event and in its vein graft location, it is at high risk      for closure if Plavix and aspirin are discontinued.  Understanding the      risks of bleeding, we would still recommend that this be continued to      avoid acute infarct in this region.  We will continue to follow his care      with you.  I have discussed this also with the patient, who is quite      anxious about this.   Would continue other medicines for now, watch Toprol-XL.  Would switch to  lower dose Lopressor 12.5 mg b.i.d.  Can switch to IV intermittently as  needed.  Hold for SBP less than 100.  Will review with Mr. Landgrebe his  response to other  statins.  Would favor with his recent incident more  aggressive lipid lowering.   Will continue to follow with you.      PVR/MEDQ  D:  07/06/2004  T:  07/06/2004  Job:  [id number]

## 2010-12-15 NOTE — Discharge Summary (Signed)
[place]  Patient:    John [name], HEAP Visit Number: [id number] MRN: [id number]          Service Type: [facility] Location: [id number] Attending Physician:  Faith Rogue T Dictated by:   Dian Situ, PA Admit Date:  03/21/2001 Discharge Date: 03/27/2001   CC:         Sonda Primes, M.D. [facility]  Bea Laura Graceann Congress, M.D. [facility]  Robert A. Thurston Hole, M.D.   Discharge Summary  DISCHARGE DIAGNOSES: 1. Status post left hip hemiarthroplasty for fracture. 2. Orthostasis, resolved. 3. History of frequency. 4. Gastroesophageal reflux disease.  HISTORY OF PRESENT ILLNESS:  The patient is a 77 year old male who was pushed by a horse on August 16 with fall and immediate onset of left hip pain with inability to walk.  X-rays done at [facility] ED showed left femoral neck fracture.  He was cleared for surgery by [facility] and underwent left hip hemiarthroplasty August 17.  Postoperative is weightbearing as tolerated and on Coumadin for DVT prophylaxis with INR therapeutic at admission today. UA was noted to be positive and patient placed on Cipro.  A moderate amount of serous drainage is noted to continue from left hip as well as low-grade temperature elevation.  Orthostasis noted, with patient complaining of dizziness.  He has also had complaints of frequency.  He is min assist for transfers, close supervision, ambulating 75 feet with standing walker.  PAST MEDICAL HISTORY:  Significant for coronary artery disease status post CABG in 1995, hypertension, history of premalignant colon polyps, history of cataracts and dyslipidemia.  ALLERGIES:  No known drug allergies.  SOCIAL HISTORY:  The patient is married and lives with wife in tri-level home. There are 5-7 steps to enter the house.  He was discharged to daughters home who will provide supervision assistance as needed.  He quit tobacco years ago, drinks a glass of wine a  day.  HOSPITAL COURSE:  The patient was admitted to rehabilitation on March 21, 2001, for inpatient therapies to consist of PT/OT daily.  Past admission, Pepcid was added secondary to his history of GERD symptoms and PVRs were checked secondary to his history of frequency.  Bladder scans done showed volumes at 18-16 cc.  The patients urine culture was negative, however, he maintained on Tequin for a few additional days secondary to wound drainage. As this resolved, the patient was taken off of Tequin on August 26.  He was noted to have orthostasis and abdominal and thigh-high TEDS were ordered, his Toprol was held initially.  Labs done showed the patient to be slightly dehydrated, which was also contributing to his orthostasis and he was encouraged to push p.o. fluids.  BUN and creatinine at admission was at 26 and 1.2.  He was noted to drop his blood pressure and systolics from 130/70, 100/70.  The patients symptomatology has improved and he was taken off abdominal binder 24 hours prior to discharge.  He was also started on Toprol 24 hours prior to discharge without any recurrence of symptoms.  Blood pressures during the stay 48 hours prior to discharge have ranged from 120s/60s systolics to 140/80 diastolic.  Of note, he was noted to have some hypoglycemia on a.m. BMP check and CBGs were checked for 24 hours showing blood sugars at 159, 174 range.  Hemoglobin A1c was checked and was within normal limits and was at 5.8.  The patient does report a prior history of intermittent hyperglycemia.  He is advised  to follow up on this, especially in terms of mental or physical stress.  The patients hip incision has healed well, staples were discontinued and steri-stripped on postoperative day 11 prior to discharge.  The last check of labs from August 28 showed a sodium of 135, potassium 4.7, chloride 103, CO2 26, BUN 26, creatinine 1.1, glucose 145, hemoglobin 10.9, hematocrit 31.2, white count  11.7, platelets 370.  The patient is instructed to continue to push p.o. fluids past discharge.  At the time of discharge the patients INR is at 1.9.  The patient is discharged on 5 mg of Coumadin to alternate with 2.5 every other day.  The patient is modified independent for transfers, is at ______ supervision for ambulating 40-60 feet with tall rolling walker.  He is at modified independent for ADL needs with use of assistive adaptive device. Further followup therapies to include home health PT/OT, which is set up through [facility], home health RN has been arranged for protime draws.  Next draw on April 01, 2001 with [facility] to follow protimes for the next two weeks.  On March 27, 2001, he was discharged to home with assistance to be provided as needed.  DISCHARGE MEDICATIONS: 1. Coumadin 5 mg alternating with 2.5 every other day. 2. Toprol XL 25 mg per day. 3. Avapro 150 mg per day. 4. Trinsicon 1 p.o. b.i.d. 5. OxyCodone IR 5-10 mg p.o. q.4-6h. p.r.n. pain. 6. Skelaxin 400 mg q.i.d. p.r.n. spasm.  ACTIVITY:  Follow total hip precautions.  On left lower extremity, use walker.  DIET:  Regular.  Drink plenty of fluids.  WOUND CARE:  Keep area clean and dry.  SPECIAL INSTRUCTIONS:  No alcohol, no smoking or driving.  [facility] to provide PT/OT.  FOLLOWUP:  Patient is to follow up with Dr. Thurston Hole in two weeks.  Follow up with Dr. Glennon Hamilton in two weeks for re-evaluation of BP meds and adjustment as needed.  Follow up with Dr. Riley Kill as needed. Dictated by:   Dian Situ, PA Attending Physician:  Faith Rogue T DD:  03/27/01 TD:  03/27/01 Job: [id number]

## 2010-12-15 NOTE — Assessment & Plan Note (Signed)
[facility]                            CARDIOLOGY OFFICE NOTE   ASAEL, PANN                       MRN:          [id number]  DATE:10/24/2006                            DOB:          [birth year]    Mr. Losito is a very pleasant 77 year old healthy white married male  followed here for more than 60 years.  He has a long history of coronary  artery disease as described in the note of July 17, 2006.  We  recently did a screening Myoview study, which revealed development of  chest discomfort at 1 minute of stage 2 of the Bruce protocol.  There  was significant downsloping ST depression and the scan revealed minor  ischemia.   I called him yesterday concerning this.  He walked in today to discuss  it.  I have recommended coronary angiography, with which Dr. Juanda Chance  concurs.   The patient would like to put this off for a month.  I have encouraged  him to return within 2 weeks or sooner should he have any recurrent  symptoms.   I should note that his most recent catheterization in February 2005  revealed total occlusion of all three native vessels, patent LIMA.  He  had a high-grade distal graft stenosis on the right and underwent  stenting with a Cypher stent.  I suspect he has progression of disease  in the saphenous vein graft to the RCA.     Cecil Cranker, MD, [facility]  Electronically Signed    EJL/MedQ  DD: 10/24/2006  DT: 10/24/2006  Job #: [id number]

## 2010-12-15 NOTE — Discharge Summary (Signed)
NAME:  John [name], John [name] NO.:  [id number]   MEDICAL RECORD NO.:  [id number]          PATIENT TYPE:  INP   LOCATION:  6524                         FACILITY:  [facility]   PHYSICIAN:  Adolph Pollack, M.D.DATE OF BIRTH:  April 14, 1924   DATE OF ADMISSION:  05/09/2004  DATE OF DISCHARGE:  05/26/2004                                 DISCHARGE SUMMARY   REASON FOR ADMISSION:  Elective laparoscopic-assisted right colectomy.   HISTORY OF PRESENT ILLNESS:  This 77 year old male has had a right colonic  polyp that has been recurrent.  He was sent to me and partial colectomy was  recommended since the polyp could not be completely removed.  He was  admitted for that reason.   HOSPITAL COURSE:  He underwent a laparoscopic-assisted right colectomy  May 09, 2004.  Pathology returned a tubulovillous adenoma with focal  high grade dysplasia but with no invasive carcinoma identified.  Postoperatively, he did fairly well on his first postoperative day, but on  his third postoperative day, he began having, after Foley was removed,  voiding small amounts.  He had a low grade fever.  He had significant  leukocytosis.  Sending urinalysis and cultures, started him on empiric  antibiotics, and subsequently ordered a CT scan which demonstrated no leak  but an ileus pattern.  Cardiac enzymes were also ordered as well as an EKG  and this suggested this an acute myocardial infarction.   The reason for ordering his cardiac enzymes was because he went into acute  atrial fibrillation.  He subsequently corrected to sinus rhythm.  Cardiology  saw him and put him on a beta blocker.  He had some difficulty with  postoperative ileus.  He began having some multiple loose bowel movements  and flatus.  Ileus was slowly improving.  He also had some problems with  urinary retention.   I subsequently requested that he be seen by urology.  Because of his  persistent diarrhea, he was empirically started on  Flagyl and stool for C.  difficile toxin was checked; however, these were negative.   As for the urinary retention, he had a number of voiding trials but failed  all these.  Dr. Excell Seltzer. Annabell Howells saw him and had him transferred over to his  office and performed cystoscopy.  He had some significant BPH.  It was felt  by cardiology that he would benefit from a potential PTCA and a stent.  There is also some concern whether he may need a TURP.  As they wanted to  put a coated stent in any procedures would need to be delayed  from three to  six months.  It was felt this was best for him at the time.   He subsequently was transferred over to [facility] where  he underwent PTCA and stent placement which he tolerated well.  This  followed the initial cardiac catheterization that he had.  Following that,  he did fairly well.  He was tolerating a diet.  Bowels had become more  formed.   On May 27, 2004, it was  felt that he was ready for discharge and was  discharged home.   DISPOSITION:  Discharged home in satisfactory condition.  He is wearing a  Foley catheter with a leg bag.   FOLLOW UP:  He is going to follow up with me in the office in one to two  weeks and with Dr. Excell Seltzer. Wrenn as well.  He will follow up with the  cardiologist as planned by them.   DISCHARGE MEDICATIONS:  Proscar, Plavix, nitroglycerin p.r.n., coated  aspirin, as well as Tylenol p.r.n. pain.      Tish Men  D:  06/26/2004  T:  06/26/2004  Job:  [id number]   cc:   Excell Seltzer. Annabell Howells, M.D.  [address]  [place]  [place] [address]  Fax: [phone]   Ulyess Mort, M.D. [facility]   Aleksei V. Plotnikov, M.D. [facility]

## 2010-12-15 NOTE — Assessment & Plan Note (Signed)
[facility]                            CARDIOLOGY OFFICE NOTE   KRISTAIN, HU                       MRN:          [id number]  DATE:07/17/2006                            DOB:          1924/01/08    Mr. Sullivant is a very pleasant 77 year old white married male followed in  this practice since the 25s.  He has a long history of coronary artery  disease with onset of angina 1974 with coronary artery bypass grafting  surgery in 1995 by Dr. Particia Lather, at which time the internal  mammary artery was anastomosed to the left anterior descending coronary  artery, and saphenous vein graft was anastomosed to the right coronary  artery.   He had a right colectomy for polyp, which was unable to be removed  Hebycolonoscopy.This revealed high-grade dysplasia, but no invasive  carcinoma.  Post-op he had atrial fibrillation and had a myocardial  infarction. He Underwent catheterization and stenting of high-grade  stenosis of the saphenous vein graft to the RCA.   The patient has  gotten along well since that time.  He has also had a hip operation in  the past.   He has had hyperlipidemia and hypertension, and a history of  diverticulitis.   Medications include Avalide 300/12.5 daily, aspirin 81 mg daily,  Pravachol 40 mg daily, Toprol XL 50, Plavix 75, Protonix 40, and Flomax  0.5.   Laboratory data 2 days ago revealed an LDL of 63, HDL of 28.  Renal  profile was normal.  Liver function tests were normal.  Blood sugar was  112.   Blood pressure 148/74, pulse 61, normal sinus rhythm.  GENERAL APPEARANCE:  Normal.  JVPs not elevated.  Carotid pulses palpable and equal without bruits.  LUNGS:  Clear.  CARDIAC:  Normal with no murmur, gallop, or rub.  ABDOMEN:  Unremarkable.  EXTREMITIES:  Normal.   I should note he also had some mild dilatation in the infrarenal and  abdominal aorta up to 3.0 cm.  That was in 2003.   DIAGNOSES:  As above.  I  have suggested follow his blood pressure  closely and gives [place] a report after a couple of weeks.  I have also  suggested that he have a stress Myoview and an  abdominal ultrasound.  Will see him back in 3 months or p.r.n.  At that  time we will decide about cardiology followup.     Cecil Cranker, MD, [facility]  Electronically Signed    EJL/MedQ  DD: 07/17/2006  DT: 07/17/2006  Job #: [id number]

## 2010-12-15 NOTE — Cardiovascular Report (Signed)
NAME:  John [name], MEHRING NO.:  [id number]   MEDICAL RECORD NO.:  [id number]          PATIENT TYPE:  OUT   LOCATION:  CATH                         FACILITY:  [facility]   PHYSICIAN:  Carole Binning, M.D. LHCDATE OF BIRTH:  1924/04/08   DATE OF PROCEDURE:  05/22/2004  DATE OF DISCHARGE:                              CARDIAC CATHETERIZATION   PROCEDURE PERFORMED:  Left heart catheterization with coronary angiography,  bypass graft angiography, left ventriculography, and abdominal aortography.   INDICATIONS FOR PROCEDURE:  Mr. Inglett is a 77 year old male with history of  coronary artery disease and previous coronary bypass surgery.  Approximately  two weeks ago, he underwent a hemicolectomy.  Postoperatively, he developed  atrial fibrillation and suffered a non-ST segment elevation myocardial  infarction with mild elevation of troponin levels.  After he was stabilized  medically, he was referred for cardiac catheterization.   PROCEDURE NOTE:  A 6 French sheath was placed in the right femoral artery.  Native coronary angiography was performed with 6 [place] JL4 and JR4  catheters.  The saphenous vein graft to the right coronary artery was imaged  with an RCB catheter.  The internal mammary artery was imaged with an  internal mammary catheter.  Left ventriculography and abdominal aortography  were performed with an angled pigtail catheter.  Contrast was Omnipaque.  There were no complications.   RESULTS:  Hemodynamics:  Left ventricular  pressure 175/22, aortic pressure 162/84.  There was no aortic gradient.   Left ventriculogram:  Wall motion is normal.  Ejection fraction is estimated  at greater than or equal to 60%.  There is no mitral regurgitation.   Abdominal aortogram reveals a 70% stenosis in the right renal artery.  The  left renal artery is patent.  There was moderate tortuosity and ectasia of  the abdominal aorta.  Of note, the patient related a history of a  small  abdominal aortic aneurysm.   Coronary angiography (right dominant):   Left main is normal.   Left anterior descending artery is 100% occluded proximally.  The distal LAD  and diagonal filled via left internal mammary graft.  The left circumflex is  100% occluded in the mid vessel after a small first obtuse marginal branch.  The distal circumflex including a normal size second obtuse marginal filled  via bridging collaterals and right to left collaterals.   The right coronary artery is 100% occluded proximally.   There is a sequential left internal mammary artery to the first diagonal  branch and the distal LAD.  This graft was patent filling the first diagonal  and the LAD.  The native LAD is occluded proximal to the first diagonal  branch.  The first diagonal branch fills retrograde to the mid LAD which  then supplies a smaller second diagonal branch.  This smaller second  diagonal branch has a 95% stenosis on its ostium.  The LAD is again occluded  after the second diagonal branch.  The distal portion of the LAD fills via  the distal limb of the left internal mammary graft.   The saphenous vein  graft to the right coronary artery has a tubular 40-50%  stenosis in the proximal segment.  At the distal anastomosis of the vein  graft there is a 70-80% stenosis.  This graft is anastomosed to the distal  right coronary artery which then supplies a large posterior descending  artery and a small posterolateral branch.  The posterior descending artery  has a 60% stenosis proximally followed by a 75% stenosis in the mid segment.   IMPRESSION:  1.  Normal left ventricular systolic function.  2.  No significant abdominal aortic aneurysm identified.  There is moderate      stenosis of the right renal artery.  3.  Native three vessel coronary artery disease.  4.  Status post coronary artery bypass surgery.  Both grafts are open but      there is what appears to be a significant  stenosis at the distal      anastomosis of the vein graft to the right coronary artery.  The native      circumflex is 100% occluded with the distal vessel filling via      collaterals.  This appears chronic.  There is also disease in a small      second diagonal branch which is not approachable percutaneously.   PLAN:  These films will be reviewed further with my colleagues.  It appears  that the patient would benefit from percutaneous coronary intervention of  the distal anastomosis of the vein graft to the distal right coronary  artery.  However, he has been having urinary retention and it is not clear  whether he may need a urologic procedure in the near future.  We will,  therefore, defer intervention until after urology consult.  If he is going  to require surgery in the near future, then would lean towards treating the  vein graft anastomosis with a bare metal stent which would require one month  of Plavix.  However, if there is no surgery planned in the next 3-6 months,  then ideally, the patient would benefit from a drug eluting stent.       MWP/MEDQ  D:  05/22/2004  T:  05/22/2004  Job:  [id number]   cc:   Georgina Quint. Plotnikov, M.D. [facility]   Bea Laura Graceann Congress, M.D.

## 2010-12-15 NOTE — Consult Note (Signed)
NAME:  John [name], John [name] NO.:  [id number]   MEDICAL RECORD NO.:  [id number]          PATIENT TYPE:  OUT   LOCATION:  CATH                         FACILITY:  [facility]   PHYSICIAN:  Excell Seltzer. Annabell Howells, M.D.    DATE OF BIRTH:  02/14/1924   DATE OF CONSULTATION:  DATE OF DISCHARGE:                                   CONSULTATION   CHIEF COMPLAINT:  Urinary retention.   HISTORY:  Mr. Deleeuw is a 77 year old white male who underwent a right  partial colectomy on May 09, 2004 for a adenomatous polyp.  Postoperatively, he developed urinary retention and has failed 2 voiding  trials.  Additionally, his case has been complicated by a myocardial  infarction.  He has had previous coronary artery bypass grafting and  apparently one of his grafts is occluded.  He has been evaluated by  cardiology who feels he is a candidate for a drug eluding stent which will  require Coumadin or Plavix for 6 months post treatment, and it was felt that  a urologic evaluation was indicated sooner than later so that a decision  could be made about therapy in case he needs surgery and the stent could be  postponed.  Prior to surgery, he did have some moderate voiding symptoms  with urgency, a diminished stream and a sensation of incomplete emptying. He  denied significant nocturia or dysuria.  He has had no hematuria, urinary  tract infections, prior urologic procedures or history of stones.  He  reports his PSA from Dr. Plotnikof's office had been normal.   PAST HISTORY:  Pertinent for no drug allergies.  Pertinent for coronary  artery disease with bypass grafting in 1995, hypertension, hyperlipidemia,  history of atrial fibrillation, history of pneumothorax.   SURGICAL HISTORY:  Pertinent for hip surgery in 2002, laparoscopic right  colectomy on May 09, 2004, tonsillectomy and coronary bypass.   CURRENT MEDICATIONS:  Enoxaparin, Avapro, Zocor, Protonix, Insulin, Flagyl,  Norvasc, Lopressor,  Maxzide, Proscar 5 mg daily, aspirin.   SOCIAL HISTORY:  He has not smoked in 40 years.  He drinks rare alcohol.   FAMILY HISTORY:  Is remarkable for the death of parent by stroke.   REVIEW OF SYSTEMS:  He denies chest pain or shortness of breath.  He denies  any current nausea, diarrhea or constipation.  He does report significant  scrotal and lower extremity edema, denies any neurologic complaints.  He is  otherwise entirely without complaints except as above.   PHYSICAL EXAMINATION:  On exam, his blood pressure is 135/79, pulse 59,  temperature 98.8.  GENERAL:  He is a well-nourished, well-developed elderly white male in no  acute distress, alert and oriented x3.  HEAD AND FACE:  Normocephalic and atraumatic.  NECK:  Supple, without thyromegaly or bruit.  LUNGS:  Clear, with normal effort.  HEART:  Regular rate and rhythm.  ABDOMEN:  Soft, without mass, hepatosplenomegaly or CVA tenderness.  There  are well-healed surgical scars.  He does have bowel sounds.  No hernias are  noted.  No inguinal adenopathy is noted.  GU:  An uncircumcised phallus with a Foley at the meatus.  He has moderate  penis, scrotal edema.  The testicles are bilaterally descended, without mass  or tenderness.  Epididymis unremarkable.  Anus and perineum without lesions.  Rectal exam reveals normal sphincter tone.  Prostate is 2-3+ in size with a  sub centimeter nodule just to the right of midline.  Seminal vesicles are  nonpalpable.  No rectal masses are noted.  EXTREMITIES:  Full range of motion with marked edema to about the knees.  SKIN:  Warm and dry.  NEUROLOGIC:  There are no focal deficits.   LABORATORIES:  BUN is 7, creatinine 0.9, glucose 151, urine culture on  October 23 is pending.  Urinalysis on October 24 revealed small leukocyte  esterase, trace blood with 0-2 white cells, 3-6 red cells.  CBC on October  24, hemoglobin of 12.4, normal white count of 10.4.  PT and PTT are 13.9 and  29.    IMPRESSION:  1.  Nodular prostate with outlet obstruction and postoperative urinary      retention.  2.  Scrotal and lower extremity edema secondary to fluid overload/cardiac      issues.  3.  Status post right colectomy for benign polyp.  4.  Postoperative MI with the need for placement of a drug-eluting stent      requiring several months of anticoagulation therapy.   PLAN:  The patient is on Proscar but that is a medicine with a very slow  onset of action.  I am going to order Flomax if that is okay with the  cardiologist, to see if we can improve voiding function.  I will arrange for  him to come to my office tomorrow by wheelchair for a cystoscopy and a flow  rate.  If he remains in retention, we will need to discuss TRP and possible  prostate biopsy.  A PSA will be obtained today and I will get the results of  prior PSAs from Dr. Juanita Laster.      JJW/MEDQ  D:  05/23/2004  T:  05/23/2004  Job:  [id number]   cc:   Georgina Quint. Plotnikov, M.D. [facility]   Ulyess Mort, M.D. [facility]   Adolph Pollack, M.D.  [address]  [place]  [place] [address]  Fax: [phone]   E. Graceann Congress, M.D.

## 2010-12-15 NOTE — Cardiovascular Report (Signed)
NAME:  GRANVIL, DJORDJEVIC NO.:  [id number]   MEDICAL RECORD NO.:  [id number]          PATIENT TYPE:  INP   LOCATION:  6524                         FACILITY:  [facility]   PHYSICIAN:  Carole Binning, M.D. LHCDATE OF BIRTH:  06-30-1924   DATE OF PROCEDURE:  05/26/2004  DATE OF DISCHARGE:                              CARDIAC CATHETERIZATION   PROCEDURE PERFORMED:  Percutaneous coronary intervention with placement of a  drug-eluting stent in the distal anastomosis of the saphenous vein graft to  the distal right coronary artery.   INDICATIONS:  Mr. Brayman is a 77 year old male who recently underwent a  partial colectomy.  His surgery was complicated by postoperative non Q-wave  myocardial infarction.  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  There was a patent  left internal mammary artery to the distal left anterior descending artery.  There was a vein graft to the distal right coronary artery which had an 80%  stenosis at the distal anastomosis.  The patient was also having difficulty  with urinary retention.  We therefore deferred intervention for a urologic  consultation.  After evaluation by urology and extensive discussion with the  patient, the patient expressed a strong preference for a drug-eluting stent  with three to six months of Plavix therapy despite the possible need for a  transurethral prostatectomy in the future.   PROCEDURAL NOTE:  A 6-French sheath was placed in the right femoral artery.  Angiomax was administered per protocol.  We used a 6-French RCD guiding  catheter.  An Asahi soft coronary guidewire was advanced under fluoroscopic  guidance via the saphenous vein graft into the distal right coronary artery.  We then positioned a 3.5 x 13 mm Cypher drug-eluting stent, crossed the  stenosis at the distal  anastomosis, and deployed the stent at 11  atmospheres.  We then went back with a 3.5 x 12 mm Quantum balloon and  positioned this within the stent, inflated the balloon to 16 atmospheres.  Intermittent doses of nitroglycerin were administered.  Final angiographic  images were obtained revealing patency of the vein graft to the PTCA site  with 0% residual stenosis and TIMI 3 flow.   COMPLICATIONS:  None.   RESULTS:  Successful placement of a drug-eluting stent in the distal  anastomosis of the saphenous vein graft to the distal right coronary artery.  An 80% stenosis was reduced to 0% residual with TIMI 3 flow.   PLAN:  Angiomax will be discontinued.  Patient will be treated with Plavix  and aspirin combination for a recommended minimum six months, preferably 12  months.  He will also require lifelong aspirin therapy.       MWP/MEDQ  D:  05/26/2004  T:  05/26/2004  Job:  [id number]   cc:   Georgina Quint. Plotnikov, M.D. [facility]   Bea Laura Graceann Congress, M.D.   [facility]

## 2010-12-15 NOTE — Discharge Summary (Signed)
NAME:  John [name], GROENEVELD NO.:  [id number]   MEDICAL RECORD NO.:  [id number]          PATIENT TYPE:  INP   LOCATION:  0374                         FACILITY:  [facility]   PHYSICIAN:  Lina Sar, M.D. [facility]  DATE OF BIRTH:  Dec 02, 1923   DATE OF ADMISSION:  07/06/2004  DATE OF DISCHARGE:  07/10/2004                                 DISCHARGE SUMMARY   ADMISSION DIAGNOSES:  1.  Acute gastrointestinal bleed, lower, suspect diverticular.  2.  Orthostasis and syncope secondary to above.  3.  Hypotension secondary to above.  4.  Recent myocardial infarction status post percutaneous coronary      intervention and drug-eluting stent placement May 26, 2004, on      aspirin and Plavix.  5.  Status post laparoscopic-assisted right hemicolectomy May 09, 2004,      for tubovillous adenoma.  6.  Benign prostatic hypertrophy with urinary retention with current Foley.  7.  History of hypertension.  8.  Status post previous coronary artery bypass grafting.  9.  Hyperlipidemia.  10. Status post hip replacement in 2002.   DISCHARGE DIAGNOSES:  1.  Stable status post acute major lower gastrointestinal bleed, felt      diverticular in origin and exacerbated by aspirin and Plavix.  2.  Anemia secondary to above.  3.  Orthostasis and syncope secondary to above.  4.  Hypotension secondary to above.  5.  Recent myocardial infarction status post percutaneous coronary      intervention and drug-eluting stent placement May 26, 2004, on      aspirin and Plavix.  6.  Status post laparoscopic-assisted right hemicolectomy May 09, 2004,      for tubovillous adenoma.  7.  Benign prostatic hypertrophy with urinary retention with current Foley.  8.  History of hypertension.  9.  Status post previous coronary artery bypass grafting.  10. Hyperlipidemia.  11. Status post hip replacement in 2002.  12. Persistent bladder outlet obstruction secondary to benign prostatic      hypertrophy  with indwelling Foley.   CONSULTATIONS:  1.  Excell Seltzer. Annabell Howells, M.D., urology.  2.  Adolph Pollack, M.D., surgery.  3.  Pricilla Riffle, M.D., cardiology.   PROCEDURES:  Transfusion x 2.   BRIEF HISTORY:  John [name] is a very nice 77 year old white male who is  status post laparoscopic-assisted right hemicolectomy per Dr. Abbey Chatters in  mid October 2005.  During that hospitalization postoperatively, he did  suffer an MI and underwent cardiac catheterization with stent placement to  the distal anastomosis of the RCA.  This is a drug-eluting stent, and he is  on aspirin and Plavix with plans to remain on aspirin and Plavix x 1 year.  At this time, he has developed abrupt onset of bright red blood per rectum  about 7 p.m. on the evening of admission.  He did not have any melena or  abdominal pain but had several bloody stools at home and then arrived in the  emergency room at about 1:15 a.m.  He had one further bloody stool in the  emergency room.  He was orthostatic in the ER with blood pressure 76/59  standing.  He did have one syncopal episode apparently in the emergency room  but no syncope or presyncope prior to arriving at the hospital.  He had no  complaints of chest pain or shortness of breath.  He was seen and evaluated  by Dr. Leone Payor, who was on call for GI, and admitted to the hospital for  stabilization, volume replacement, transfusions as indicated, for what is  felt to be most likely diverticular bleed exacerbated by aspirin and Plavix.  He is placed in the intensive care unit at the time of admission.   LABORATORY DATA AND OTHER STUDIES:  On admission, December 8, WBC 13.4,  hemoglobin 10.9, hematocrit 32.6, MCV 88, platelets 190.  Followup at 6 a.m.  on December 8, hemoglobin 8.9, hematocrit 26.5.  He was transfused 2 units  to a hemoglobin of 10.6, hematocrit 32.  Followup on December 9, hemoglobin  10, hematocrit 29.8.  Followup on December 11, hemoglobin 11.4,  hematocrit  33.8.  On December 12, hemoglobin 11.6, hematocrit 34.5.  Pro time 12.9 on  admission, INR 1.0.  Potassium 3.4 on admission, glucose 156, BUN 18,  creatinine 1.0, albumin 3. Followup on December 9, BUN 10, creatinine 0.8.  Liver function studies normal.  Albumin again was 3.  Hemoglobin A1C 5.5 in  normal range.   EKG on admission showed sinus rhythm with occasional PVCs.   HOSPITAL COURSE:  The patient was admitted to the service of Dr. Stan Head, who was covering on call.  He was placed in the intensive care  unit, kept at bed rest.  He had two IVs placed, was given volume replacement  and frequent hemoglobin and hematocrit.  He was placed on oxygen at 2  liters.  He stabilized within the first 6 to 8 hours of admission, had no  further hypotension.  Hemoglobin did drop to 8.9, and he was then transfused  2 units of packed red blood cells on the morning of December 8.   He was seen in consultation by cardiology on that same day for advice  regarding aspirin and Plavix in the face of an acute lower GI bleed.  It was  felt imperative that he remain on aspirin and Plavix given the fact that the  had a drug-eluting stent approximately a month ago and also because of the  critical positioning of this stent.  We, therefore, continued him on aspirin  and Plavix with careful observation.  Unfortunately, he did not have any  evidence of any active bleeding after being admitted to the hospital.  His  hemoglobin remained stable after transfusion. We watched him in the  intensive care unit for approximately 48 hours and then was admitted to the  floor.  We gradually advanced his diet which he tolerated without  difficulty.   He was seen in consultation by urology as well, Dr. Annabell Howells, because of  ongoing problems with urinary retention.  He has had an indwelling Foley  over the past few weeks.  Foley is to be left in.  He is to follow up with Dr. Annabell Howells in his office the week of  discharge.  He is to continue on Flomax  and Proscar and, because of recent drug-eluting stent, he cannot have  surgery, I believe, until he is six months out.  He was also seen by Dr.  Abbey Chatters in consultation, who had done his right hemicolectomy.   By December 11,  he was eating.  Hemoglobin was stable at 11.4.  he had not  had any further bowel movement.  On December 12, he was allowed discharge to  home in stable condition with instructions to follow up with Dr. Victorino Dike  on December 21 at 12, to call for any problems in the interim with bleeding.  He was also advised that he could come back to the emergency room in the  event of recurrent bleeding.  He is to maintain a low-residue diet, limit  activity.  No strenuous exercise, heavy lifting, etc., over the next couple  of weeks.  He was to start cardiac rehab, and we advised him to put this off  for a couple of weeks.   Again, he is to follow up with Dr. Annabell Howells later this week and to continue  Flomax and Proscar as well as the Foley for the time being.  Low-residue  diet x 1 weeks.   DISCHARGE MEDICATIONS:  1.  Aspirin 81 mg daily with food.  2.  Plavix 75 mg daily.  3.  Protonix 40 mg daily in a.m. for gastric protection.  4.  Flomax 0.4 daily.  5.  Proscar 5 mg daily.  6.  Pravachol 40 mg daily.  7.  Toprol XL as previously.  8.  Avalide 1 p.o. daily as previously.  9.  Foltx 1 p.o. daily as previously.  10. Milk of magnesia as needed for constipation.     Amy   AE/MEDQ  D:  07/10/2004  T:  07/10/2004  Job:  [id number]   cc:   Excell Seltzer. Annabell Howells, M.D.  [address]  [place]  [place] [address]  Fax: [phone]   Adolph Pollack, M.D.  [address]  [place]  [place] [address]  Fax: [phone]   Pricilla Riffle, M.D.   Ulyess Mort, M.D. [facility]

## 2010-12-15 NOTE — Consult Note (Signed)
NAME:  JOHNWESLEY, LEDERMAN NO.:  [id number]   MEDICAL RECORD NO.:  [id number]          PATIENT TYPE:  INP   LOCATION:  0152                         FACILITY:  [facility]   PHYSICIAN:  Olga Millers, M.D. LHCDATE OF BIRTH:  11/01/1923   DATE OF CONSULTATION:  05/12/2004  DATE OF DISCHARGE:                                   CONSULTATION   PRIMARY CARE PHYSICIAN:  Georgina Quint. Plotnikov, M.D. [facility]   PRIMARY CARDIOLOGIST:  Cecil Cranker, M.D.   CHIEF COMPLAINT:  Abnormal cardiac enzymes/PAF.   HISTORY AND PHYSICAL:  Mr. Eble is a 77 year old male with known coronary  artery disease.  He was admitted on May 09, 2004 for a laparoscopic  right colectomy.  Postoperative internal medicine consult was obtained and  they evaluated him for paroxysmal atrial fibrillation with rapid ventricular  response.  His cardiac enzymes were checked and were elevated and so a  cardiology consult was called.   Mr. Barnhard denies any recent cardiac symptoms.  He saw Dr. Glennon Hamilton in  September of 2005 and was doing well.  The paroxysmal atrial fibrillation  resolved.  He had been on IV Lopressor but this was increased.  He was on a  p.o. beta blocker prior to admission.  He has not gotten this today because  of the IV medication, but received it on October 12 and 13.  He had no chest  pain.  He denies any recent chest pain or shortness of breath.   PAST MEDICAL HISTORY:  1.  Significant for aortocoronary bypass surgery in 1995 with LIMA to LAD      and SVG to RCA.  He has had no __________since his bypass surgery and      his last test was several years ago.  He has a history of hyperlipidemia      and hypertension and postoperative atrial fibrillation after his bypass.      He has a history of spontaneous pneumothorax.   PAST SURGICAL HISTORY:  1.  Hip surgery in 2002.  2.  Laparoscopic right colectomy this admission.  3.  Tonsillectomy.  4.  Catheterization.  5.  Bypass  surgery.   ALLERGIES:  No known drug allergies.   MEDICATIONS PRIOR TO ADMISSION:  1.  Avalide 300/12.5  daily.  2.  Foltx b.i.d.  3.  Pravachol 20 daily.  4.  Toprol XL 50 daily.  5.  Aspirin 81 mg daily.  6.  Multivitamin.   SOCIAL HISTORY:  He lives in [place] with his wife and is retired from  [employer].  He quit tobacco greater than 40 years ago and does not abuse  alcohol or drugs.   FAMILY HISTORY:  Both of his parents are deceased.  One had a CVA and the  other one did not have any kind of vascular disease.  He has no siblings  with coronary artery disease.   REVIEW OF SYSTEMS:  Significant for melena with a history of colonoscopy and  multiple polypectomies.  He denies any fevers or chills.  He denies any  chest pain.  He  has very few arthralgias.  Review of systems is otherwise  negative.   PHYSICAL EXAMINATION:  VITAL SIGNS:  Temperature is 98.6, blood pressure  147/106, pulse 85, respiratory rate 24, O2 saturation 96% on 2 L.  GENERAL:  He is a well-developed, well-nourished, white male in no acute  distress.  HEENT:  Head is normocephalic and atraumatic.  Pupils are equal, round, and  react to light and accommodation.  Extraocular movements intact.  Nares  without discharge.  NECK:  There is no significant JVD with his head at 30 degrees, no  thyromegaly.  No carotid bruits are appreciated.  HEART:  His heart is regular in rate and rhythm with an S1 and S2.  His  distal pulses are 2+.  LUNGS:  Essentially clear.  SKIN:  No rashes or lesions with his abdominal incision healing well.  ABDOMEN:  A bit distended and slightly tender with decreased bowel sounds.  EXTREMITIES:  Trace pedal edema is noted.  There is no cyanosis or clubbing.  MUSCULOSKELETAL:  There is no joint deformity or fusions.  NEUROLOGIC:  He is alert and oriented with cranial nerves II-XII grossly  intact.   LABORATORY DATA:  Chest x-ray:  Postoperative bypass changes, no acute  disease.   EKG:  Initial EKG shows atrial fibrillation at rate of 152.  Repeat EKG is sinus rhythm, rate 72 with questionable ST changes in the  lateral leads.   Laboratory values:  White count is 21.3, hemoglobin 15.7, hematocrit 47.1,  platelets 208.  Sodium 140, potassium 3.7, chloride 112, CO2 23, BUN 13,  creatinine 1, glucose 225.  CK-MB 155/12.4, troponin I 0.8.   ASSESSMENT/PLAN:  1.  Mr. Francisco is a 77 year old male with a past medical history of bypass      surgery who did well postoperatively until this a.m. when he developed      atrial fibrillation with rapid ventricular response.  He had no chest      pain and no shortness of breath.  He converted to sinus rhythm but his      CK-MB and troponin were elevated.  Atrial fibrillation is most likely      secondary to hyperadrenergic state associated with surgery.  Will      increase the Toprol to 75 mg p.o. daily and cycle enzymes.  He will most      likely need cath next week.  Increased enzymes may be secondary to      atrial fibrillation, but the level seems more consistent with MI.  Agree      with ASA.  Since he is n.p.o. we will continue IV Lopressor until      tolerating p.o. intake well.  The patient is otherwise per Dr.      Abbey Chatters.  Dr. Jens Som saw the patient and determined the plan of      care.      RB/MEDQ  D:  05/12/2004  T:  05/12/2004  Job:  [id number]

## 2010-12-15 NOTE — Op Note (Signed)
NAME:  John [name], John [name] NO.:  [id number]   MEDICAL RECORD NO.:  [id number]          PATIENT TYPE:  INP   LOCATION:  X003                         FACILITY:  [facility]   PHYSICIAN:  Adolph Pollack, M.D.DATE OF BIRTH:  July 05, 1924   DATE OF PROCEDURE:  05/09/2004  DATE OF DISCHARGE:                                 OPERATIVE REPORT   PREOPERATIVE DIAGNOSIS:  Recurrent right colonic polypoid mass.   POSTOPERATIVE DIAGNOSIS:  Recurrent right colonic polypoid mass.   OPERATION:  Laparoscopic-assisted right colectomy.   SURGEON:  Adolph Pollack, M.D.   ASSISTANT:  Lebron Conners, M.D.   ANESTHESIA:  General.   INDICATIONS:  This is a 76 year old male who has had a right colonic polyp  that has been removed but has recurred.  Recent colonoscopy showed it to be  recurring again.  He is now sent over because of recurrent polypoid mass.  Biopsies of his have been showing this as an adenomatous polyp without  malignancy.  The procedure, risks, and rationale were discussed with him  preoperatively.   TECHNIQUE:  He is seen in the holding area and brought to the operating  room.  Placed supine on the operating room table.  A general anesthetic was  administered.  The hair was shaved from the anterior abdominal wall, and the  abdominal wall was sterilely prepped and draped.  A 5 mm incision was made  in the left upper quadrant and using a 5 mm Versaport trocar, the peritoneal  cavity was entered under direct vision.  CO2 gas was then insufflated to the  peritoneal cavity, creating a pneumoperitoneum.  Following this, a second 5  mm trocar was placed in the left lateral abdomen, and one was then placed in  the lower midline.  The 5 mm trocar in the left upper quadrant was changed  out for a 10 mm trocar.   The 30 degree laparoscope was then inserted through a 10 mm trocar in the  left upper quadrant.  The cecum was identified, and lateral attachments to  the cecum and  ascending colon were divided with our harmonic scalpel,  mobilizing the cecum in a medial-like fashion.  The distal ileum was freely  mobile.  I then identified the hepatic flexure and mobilized it, using the  harmonic scalpel and mobilized the proximal half of the transverse colon,  using the harmonic scalpel.  I kept my dissection above the plane of the  ureter on the right side and above the duodenum towards the right upper  quadrant region.   Following this, I made an extraction incision in the lower midline and  inserted a lap disk.  I then inserted my hand into the peritoneal cavity and  performed some hand-assisted dissection, further freeing up the right colon  and transverse colon.  Once I felt that I had adequate mobilization of the  right colon and proximal half of the transverse colon, I then removed the  distal ileum, right colon, and proximal half of the transverse colon through  the extraction incision.  I then used the GIA stapling  device to divide the  colon, the proximal and towards the transverse colon region.  The distal  ileum was then divided as well.  The mesentery in between the mesenteric  vessels were divided, and a fan-shaped mesenteric resection was performed.  Vessels were ligated.   The specimen was taken to the back table, cut open, and the large polypoid  mass was identified.  It was then sent to pathology.   Gloves were changed at this time.  I then performed a side-to-side  anastomosis between the distal ileum and the transverse colon in a stapled  fashion.  The remaining enterotomy was closed with a linear noncutting  staple.  A crotch stitch was then placed to release tension in that  region.  The anastomosis was patent, viable, and under no tension.   The anastomosis was dropped back down into the abdominal cavity, and gloves  were changed once again.  The abdominal cavity was irrigated with two liters  of solution.  No active bleeding was  noted.   Sponge and instrument counts were reportedly correct as well as needle  counts at this time.  I subsequently closed the peritoneum with a running 2-  0 Vicryl suture.  The fascia was then closed with a running #1 PDS suture of  the extraction site incision, and staples were used to close the skin.   I reinsufflated the abdomen and inspected the area, and the closure was  solid.  No bleeding was noted.  I subsequently removed the trocars and  released the pneumoperitoneum.  The trocar site incisions were then closed  with staples.   He tolerated the procedure well without any apparent complications.  He was  subsequently extubated and taken to the recovery room in satisfactory  condition.      TJR/MEDQ  D:  05/09/2004  T:  05/09/2004  Job:  [id number]   cc:   Ulyess Mort, M.D. [facility]   Aleksei V. Plotnikov, M.D. [facility]

## 2010-12-15 NOTE — Discharge Summary (Signed)
[facility]  Patient:    John [name], John [name] Visit Number: [id number] MRN: [id number]          Service Type: SUR Location: 3W 0365 01 Attending Physician:  Twana First Dictated by:   Julien Girt, P.A. Adm. Date:  [id number] Disc. Date: 03/18/01                             Discharge Summary  ADMITTING DIAGNOSES: 1. Left femoral neck fracture. 2. Coronary artery disease. 3. Hyperlipidemia. 4. Hypertension.  DISCHARGE DIAGNOSES: 1. Left hip femoral neck fracture, status post hemiarthroplasty. 2. Hyperlipidemia. 3. Coronary artery disease. 4. Hypertension. 5. Urinary tract infection.  HISTORY OF PRESENT ILLNESS:  The patient is a 77 year old male who was run over by a horse on March 14, 2001.  He presented to [facility] ER with exquisite hip pain.  X-rays at that time showed a femoral neck fracture. Cardiac clearance was obtained by [facility] and he went to surgery on March 15, 2001.  HOSPITAL COURSE:  On March 15, 2001, the patient underwent a left hip hemiarthroplasty.  He tolerated the procedure well.  He went to the telemetry floor postop to follow his heart.  On postop day #1, he had a T-max of 101.2. UA showed bacteria in his urine.  He was in normal sinus rhythm.  His sodium was 135 and potassium 4.7.  Hemoglobin was 12.9.  INR was 1.2.  Postop day #2, the patient had some nausea.  T-max was 100.7, sodium was 132 and potassium 4.1.  INR was 1.7.  Hemoglobin was 12.4.  He was on day #2 of seven days of Cipro.  On postop day #3, surgical wound is well-approximated.  He has a moderate amount of serous drainage.  Hemoglobin was 11.7.  Sodium was 131 and potassium 3.9.  INR is therapeutic at 2.0.  [facility] has signed off on him stating that he is medically stable.  He is also stable from an orthopedic standpoint and he will be transferred today to the rehabilitation unit at [facility] for  intensive physical therapy.  ACTIVITY:  Weightbearing as tolerated.  DIET:  Regular.  DISCHARGE MEDICATIONS:  1. Cipro 500 mg one p.o. b.i.d.  2. Zocor 10 mg one p.o. q.d.  3. Avapro 300 mg one p.o. q.d.  4. Hydrochlorothiazide 12.5 mg one p.o. q.d.  5. Colace 100 mg one p.o. q.d.  6. Lopressor 25 mg one p.o. q.d.  7. Aspirin 81 mg one p.o. q.d.  8. Reglan 10 mg one p.o. q.8h. p.r.n. nausea.  9. Robaxin 500 mg one q.6-8h. p.r.n. muscle spasm. 10. Percocet one to two q.3-4h. p.r.n. pain.  FOLLOWUP:  We will continue to follow him on rehabilitation.  CONDITION ON DISCHARGE:  Stable.  SPECIAL INSTRUCTIONS:  He will continue to get physical therapy on his left hip.  He is weightbearing as tolerated.  Staples will remain in.  He can discontinue his Cipro on March 23, 2001. DD:  03/18/01 TD:  03/18/01 Job: [id number]

## 2010-12-15 NOTE — H&P (Signed)
NAME:  John [name], John [name] NO.:  [id number]   MEDICAL RECORD NO.:  [id number]          PATIENT TYPE:  INP   LOCATION:  0152                         FACILITY:  [facility]   PHYSICIAN:  Iva Boop, M.D. LHCDATE OF BIRTH:  1924/02/05   DATE OF ADMISSION:  07/06/2004  DATE OF DISCHARGE:                                HISTORY & PHYSICAL   CHIEF COMPLAINT:  Rectal bleeding.   HISTORY:  This is a pleasant 77 year old white man well-known to the [facility] practice.  He had developed the sudden onset of painless bright  red blood per rectum about 7 p.m. on the evening before admission (July 03, 2004).  He did not have any melena.  He had several bloody stools after  that and presented via the emergency department, where at 1:13 a.m. his  blood pressure was 142/82 with a pulse of 88.  Subsequently he had at least  one more bloody stool in the ER.  At about the time of one of his other  stools he had a blood pressure of 115/74 lying, 105/66 sitting, and 76/59  standing with a rise in pulse from 88 to 134.  He feels well as long as he  is still.  He did not have any presyncope prior to his EMS trip to the  hospital.  He has never had bleeding like this before.  He does take Plavix  and aspirin for coronary artery disease.  His recent history is notable for  an elective laparoscopic assisted right colectomy on May 09, 2004 by Dr.  Avel Peace.  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  He did well originally and then subsequently had a fever, ileus,  urinary obstruction, and then had atrial fibrillation and positive enzymes  for a MI.  Subsequently he had a PTCA and coronary stent placement with a  drug-eluting stent by Dr. Loraine Leriche Pulsipher.  He left the hospital with a Foley  because of benign prostatic hypertrophy.  The angioplasty and stent were  placed in the distal anastomosis of the saphenous vein graft to the right  coronary artery.  The patient has undergone coronary artery bypass grafting  in the past.  He also has had trouble with hypertension, dyslipidemia, and a  history of a spontaneous pneumothorax, which had been treated by Dr. Alease Medina.  He has had hip surgery and a tonsillectomy as well.  His GI review  of systems otherwise appears negative at this time.   MEDICATIONS:  1.  Plavix.  2.  Proscar.  3.  Aspirin.  4.  Toprol-XL.  5.  Flomax.  6.  He is also on Pravachol.   DRUG ALLERGIES:  None known.   SOCIAL HISTORY:  The patient is married.  He and his wife had recently moved  to a smaller home near [facility].  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  He does not smoke or use alcohol  at this  time.   REVIEW OF SYSTEMS:  Positive for eyeglasses.  He denies chest pain or  shortness of breath.  He has had the indwelling Foley and was due to have  that removed by Dr. Annabell Howells today.  He had had increasing activity and was  actually feeling quite well and about as well as he had after his recent  surgery and problems.  IE, review of systems appears otherwise unremarkable  at this time.   PHYSICAL EXAMINATION:  GENERAL:  A slightly pale elderly white man in bed in  no acute distress.  VITAL SIGNS:  As demonstrated above.  He is afebrile and his respirations  are 20.  Blood pressure 125/65 at this time with a pulse of 91 and regular.  HEENT:  Eyes are anicteric.  Mouth free of lesions.  NECK:  Supple.  No mass.  CHEST:  Clear.  HEART:  S1 S2.  No rubs or gallops.  ABDOMEN:  Soft and mildly distended.  There is a small low midline scar and  some laparoscopic scars.  There is no organomegaly or mass.  RECTAL:  Dr. Estell Harpin performed a rectal exam, which showed bright red blood.  GENITOURINARY:  There is a Foley catheter inserted into an uncircumcised  penis.  There is no groin  adenopathy.  EXTREMITIES:  Show no edema.  NEUROLOGIC:  He is alert and oriented x3.  Grossly nonfocal on exam.  SKIN:  Without acute rash.   LABORATORY DATA:  Shows a hemoglobin of 10.9 with a white count of 13.4.  On  May 27, 2004 his hemoglobin was 12.9 when he was discharged from this  hospital.  Platelet count is 190.  PT and PTT were normal.  They were 12.9  and 24 with an INR of 1.  His B-MET was normal except for a potassium that  was slightly at 3.4.  His LFTs were normal.   An EKG demonstrates normal sinus rhythm with PVCs.  No obvious ischemic  changes.   ASSESSMENT:  1.  Gastrointestinal bleeding that appears to be lower on the basis of      bright red blood.  No elevation in blood urea nitrogen.  I should      mention that he has known diverticulosis on a previous colonoscopy.  His      last colonoscopy was in August prior to his surgery.  I do not have that      official report, as of yet, but that is the history.  He has a painless      lower gastrointestinal bleed, most suspicious for diverticular bleed.  I      do not think it is related to his prior surgery.  However, it is likely      at least exacerbated by Plavix and aspirin use.  He is orthostatic but      otherwise appears stable at this time.  There is clearly potential for      significant deterioration given his other problems and he is at a higher      risk of cardiac problems due to the need for Plavix and his drug-eluting      stent.  2.  Coronary artery disease with recent percutaneous coronary interventions      with the saphenous vein graft to the right coronary artery with ongoing      aspirin and Plavix use.  3.  Mild hypokalemia.  4.  Hypertension.  5.  Bladder outlet obstruction  secondary to benign prostatic hypertrophy      followed by Dr. Bjorn Pippin with an indwelling Foley.  6.  Other problems as mentioned above in his past medical history.  There     are no other active problems at this  time.  He does have hypertension.   PLAN:  1.  Admit to step down.  2.  Serial hemoglobin transfused if needed.  3.  Aggressive hydration at this point.  4.  Hold his Plavix, aspirin, and his Toprol-XL.  I would hold his Flomax as      well due to the alpha blocker status of that.  5.  He may need a repeat colonoscopy depending upon the clinical course.  We      will hold on that for the time being and Dr. Marina Goodell, my partner, will      reassess him later today and determine that.  6.  Supplement potassium.  7.  Further plans pending clinical course.   I appreciate the opportunity to care for this patient.      CEG/MEDQ  D:  07/06/2004  T:  07/06/2004  Job:  [id number]   cc:   Ulyess Mort, M.D. [facility]   Adolph Pollack, M.D.  [address]  [place]  [place] [address]  Fax: [phone]   E. Graceann Congress, M.D.   [facility]. Annabell Howells, M.D.  [address]  [place]  [place] [address]  Fax: [phone]

## 2010-12-15 NOTE — Op Note (Signed)
[facility]  Patient:    John [name], John [name]                       MRN: [id number] Proc. Date: 03/15/01 Adm. Date:  [id number] Attending:  Twana First                           Operative Report  PREOPERATIVE DIAGNOSIS:  Left hip femoral neck fracture.  POSTOPERATIVE DIAGNOSIS:   Left hip femoral neck fracture.  PROCEDURE:  Left bipolar hip prosthesis using Osteonic System with #9 cemented ODC plus femoral component with +5 x 28 mm femoral head with 53 mm bipolar cup with #4 cement plug and 12 mm centralizer.  SURGEON:  Elana Alm. Thurston Hole, M.D.  ASSISTANT:  Kirstin A. Shepperson, P.A.  ANESTHESIA:  General.  OPERATIVE TIME:  1 hour 15 minutes.  ESTIMATED BLOOD LOSS:  300 cc.  COMPLICATIONS:  None.  DESCRIPTION OF PROCEDURE:  John [name] was brought to the operating room on March 15, 2001, placed under general anesthesia on his own bed and carefully transferred to the operative table. A Foley catheter was placed under sterile conditions, and he received Ancef 1 g IV preoperatively for prophylaxis. He was then turned in the left lateral up to keep his position and secured on the bed with the Stulberg frame. His left hip and leg were then prepped using sterile Betadine and draped using sterile technique. Originally through a 20 cm posterior lateral greater trochanteric incision, initial exposure was made. The underlying subcutaneous tissues were incised following skin incision. The iliotibial band and gluteus maximum fascia was incised longitudinally revealing the underlying sciatic nerve which was carefully protected. Short external rotators of the hip and hip capsule were released of their femoral neck insertion and tagged. The fracture was easily identified. The femoral head was removed. It was measured and found to be 53 mm in diameter. There was found to be minimal articular cartilage damage noted either on the femoral head or the acetabulum.  A 53 mm trial was placed in the acetabulum and found to be an excellent fit. It was then removed. The proximal femur was then exposed. An oscillating saw was used to make a femoral neck cut 2 cm above the lesser trochanter. Sequential axial reamers were then used to ream the proximal femur up to a #9 size followed by broaches to a #9 size, and with the #9 broach in place, a +5 x 53 mm bipolar trial was placed and reduced and found to give excellent stability up to 60 to 70 degrees of internal rotation in both neutral and 30 degrees of adduction and also stable in abduction and external rotation, and leg lengths were found to be equal. The trial prosthesis was then posteriorly dislocated and removed. The femoral canal was then measured for cement plug and #4 was felt to be appropriate size and this was placed and in the femoral canal was gently lavage irrigated with 3 liters of saline solution. The femoral canal was then filled with bone cement and then the actual #9 prosthesis was placed into position with an excellent fit with excess cement being removed from around the edges. After the cement hardened and the +5 x 28 mm femoral head was hammered onto the femoral neck and the 53 mm bipolar cup was placed on this and then the hip reduced, taking through a full range of motion found to be stable,  leg lengths equal. At this point, it was felt that all the components were of excellent size, fit, and stability. The wound was further irrigated with antibiotic solution. The hip capsule and short external rotators of the hip were then reattached to their femoral neck insertions through two drill holes in the greater trochanter. The iliotibial band and gluteus maximus fascia was closed with #1 Ethibond suture. Subcutaneous tissue was closed with 0 and 2-0 Vicryl. The skin was closed with skin staples. Sterile dressings were applied, and a hip abduction pillow applied, the patient turned supine,  awakened, extubated and taken to the recovery room in a stable condition. Leg lengths were found to be equal, rotation equal, pulses 2+ and symmetric. Needle and sponge counts correct x 2 at the end of the case. DD:  03/15/01 TD:  03/15/01 Job: [id number]

## 2010-12-15 NOTE — Consult Note (Signed)
NAME:  John [name], John [name] NO.:  [id number]   MEDICAL RECORD NO.:  [id number]          PATIENT TYPE:  INP   LOCATION:  0152                         FACILITY[facility]  [facility]   PHYSICIAN:  Adolph Pollack, M.D.DATE OF BIRTH:  Apr 27, 1924   DATE OF CONSULTATION:  07/07/2004  DATE OF DISCHARGE:                                   CONSULTATION   REASON FOR CONSULTATION:  Lower GI bleeding.   HISTORY OF PRESENT ILLNESS:  Mr. Encina is a 77 year old male who is status  post laparoscopic-assisted right colectomy May 09, 2004 for a large  tubulovillous adenoma with focal high-grade dysplasia.  He initially did  well postoperatively but then had a postoperative atrial fibrillation and a  myocardial infarction, as well as urinary retention.  He had a PTCA and  stent placement with a drug-eluting stent.  He had to leave the hospital  with a Foley catheter and was in the hospital almost 3 weeks.  He was  actually doing fairly well until the day of admission when he began  developing initially some small amount of rectal bleeding, then a large  amount of hematochezia.  He has known severe diverticulosis of the sigmoid  colon by previous colonoscopies.  He called Dr. Luan Pulling, my partner, and  went to the emergency department, was admitted by Dr. Stan Head.  At that  time he was noted to be somewhat hypotensive and tachycardic with positive  orthostatics.  He was given 2 units of blood.  Since 6 a.m. on the date of  admission he has not had any further bloody bowel movements.   PAST MEDICAL HISTORY:  1.  Colonic polyps.  2.  Coronary artery disease status post myocardial infarction.  3.  BPH.  4.  Hypercholesterolemia.  5.  Diverticulosis.   PREVIOUS OPERATIONS:  1.  Coronary artery bypass.  2.  Thoracotomy.  3.  Hip replacement.  4.  Right colectomy.  5.  Cataract surgery with lens implant.   ALLERGIES:  None.   HOME MEDICATIONS:  Plavix, aspirin, Toprol-XL, Avalide,  Proscar, Flomax,  Foltx, Pravachol.   SOCIAL HISTORY:  He drinks socially.  Quit smoking many years ago.   REVIEW OF SYSTEMS:  CARDIAC:  He denies any chest pain at this point in  time.  GI:  He had been having normal bowel movements and eating well until  he had the bloody bowel movements.   PHYSICAL EXAMINATION:  GENERAL:  A pale male who appears to be in no acute  distress.  He is pleasant and cooperative.  VITAL SIGNS:  Currently, his blood pressure and heart rate are within normal  limits.  ABDOMEN:  Soft, nontender, nondistended.  Incisions are clean and intact.  No hernias.  No masses or organomegaly.   LABORATORY DATA:  Remarkable for admitting hemoglobin of 10.9 but then  drifted down to 8.9.  INR was within normal limits.   IMPRESSION:  Lower gastrointestinal bleeding - most likely diverticular in  origin versus an anastomotic bleed which usually occurs much closer to the  time of surgery.  Currently seems to  be hemodynamically stable, receiving  blood.  Complicating factor is he has a drug-eluting stent and does require  some aspirin and Plavix to decrease the risk of occluding the stent.   RECOMMENDATIONS:  I agree with holding the aspirin and Plavix for now and  transfusion therapy.  I think given the fact he has had a recent myocardial  infarction, be best if we can get him over this nonoperatively rather than  have to do a subtotal colectomy on him.  Cardiology is to see him and  discuss when he needs to start his aspirin and Plavix back.  However, this  could increase his risk for another GI bleed.     Tish Men  D:  07/07/2004  T:  07/07/2004  Job:  [id number]   cc:   Ulyess Mort, M.D. [facility]

## 2010-12-15 NOTE — Assessment & Plan Note (Signed)
[facility]                            CARDIOLOGY OFFICE NOTE   [name], [name]                       MRN:          [id number]  DATE:10/17/2006                            DOB:          1924-04-05    Telephone call with [name] who has had previous  cardiovascular surgery. We did a Myoview on him which was abnormal, with  chest discomfort one minute, stage 2 Bruce protocol, significant ST  depression. There were some minor perfusion defects noted.   I have suggested the patient may need follow up catheterization. He is  unable to come in for office visit until 2 weeks as he is on vacation  next week. 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     E. Graceann Congress, MD, [facility]  Electronically Signed    EJL/MedQ  DD: 10/24/2006  DT: 10/24/2006  Job #: [id number]

## 2010-12-20 IMAGING — CT CT HEAD W/O CM
1 series · 16 of 30 positions shown, 20 images · non-contrast
Comparison: 07/12/2004

CLINICAL DATA: Syncope.  Atrial fibrillation.

CT HEAD WITHOUT CONTRAST
TECHNIQUE: Contiguous axial images were obtained from the base of
the skull through the vertex without contrast.

[Series 2: trauma head · axial · 0.47mm/px · z∈[+164,+315]mm · 16 of 32 slices shown, 20 images]
[im 2/32  brain]
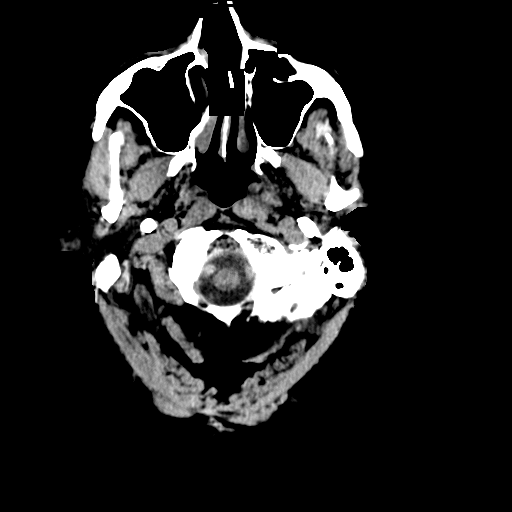
[im 2/32  bone]
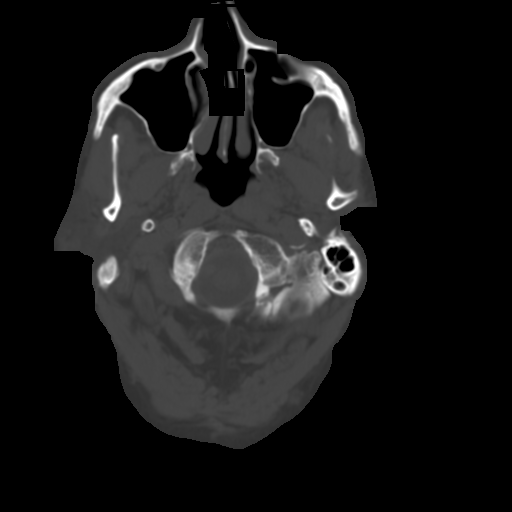
[im 4/32  brain]
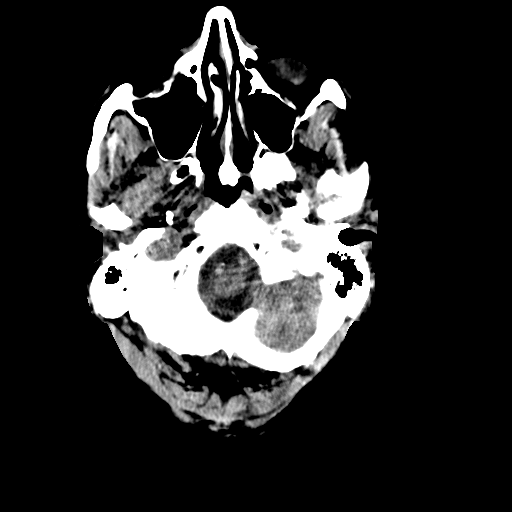
[im 6/32  brain]
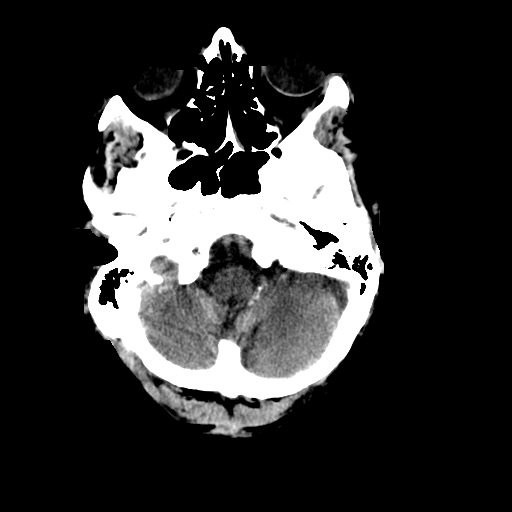
[im 8/32  brain]
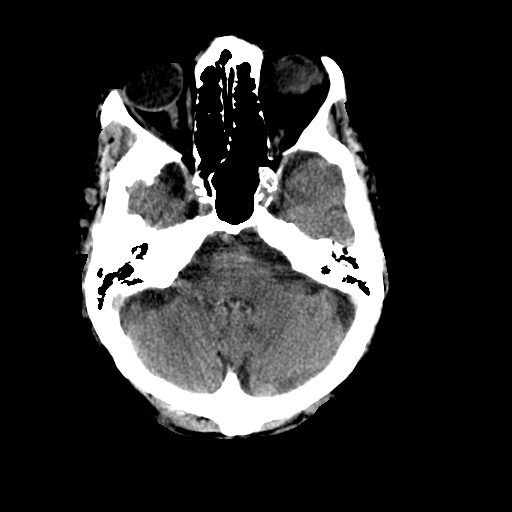
[im 9/32  brain]
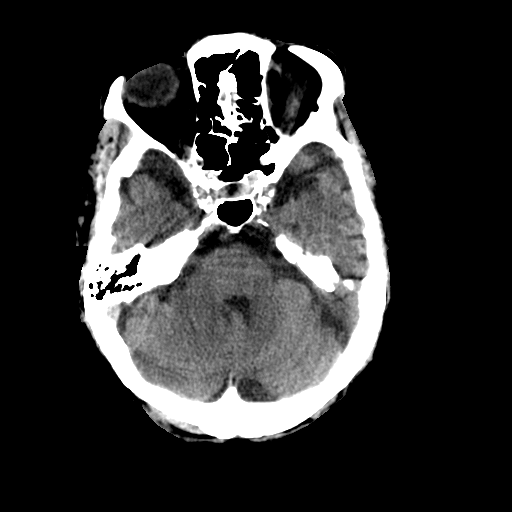
[im 9/32  bone]
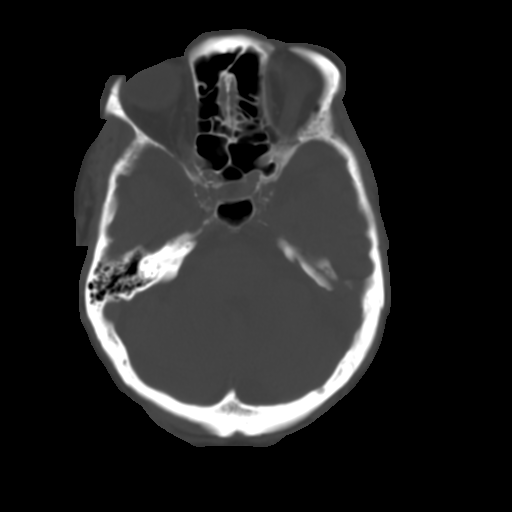
[im 11/32  brain]
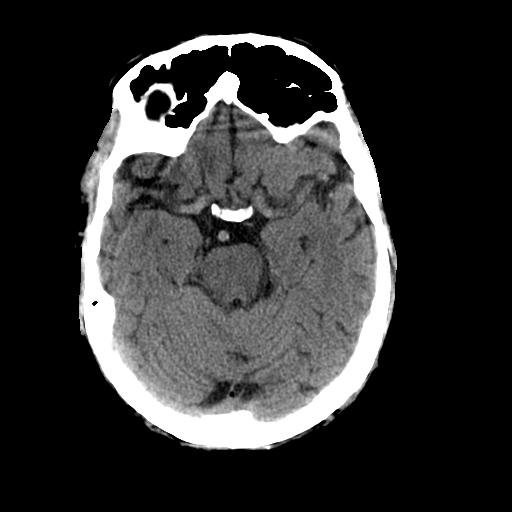
[im 13/32  brain]
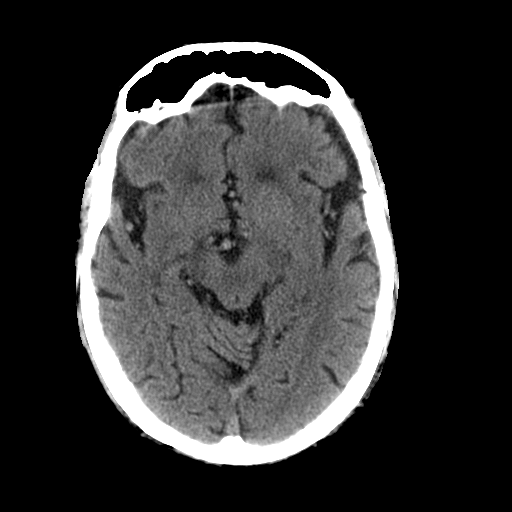
[im 15/32  brain]
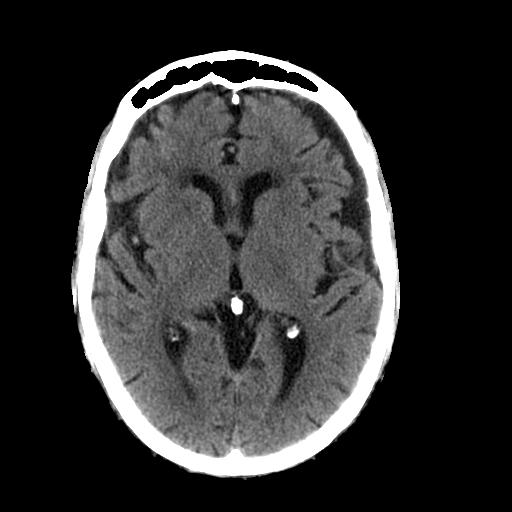
[im 17/32  brain]
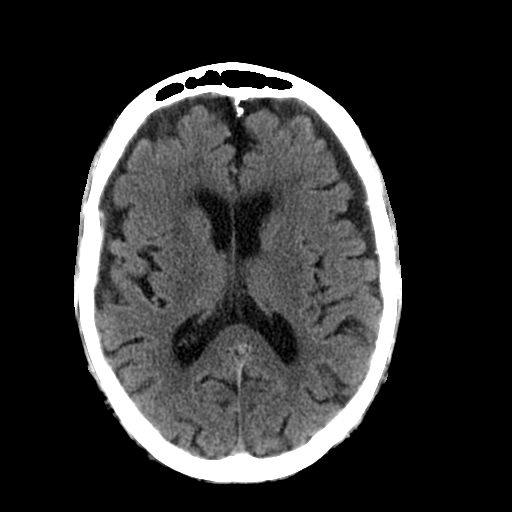
[im 17/32  bone]
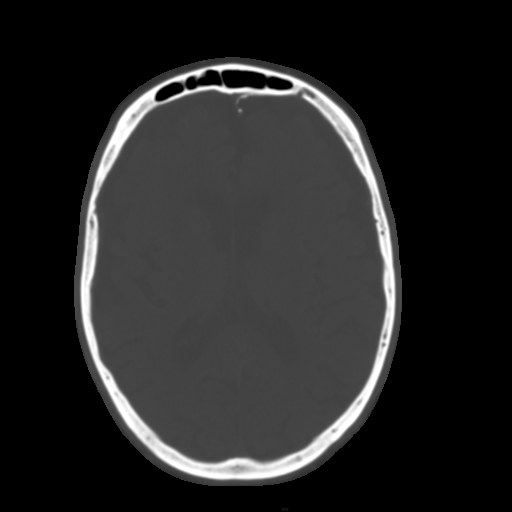
[im 19/32  brain]
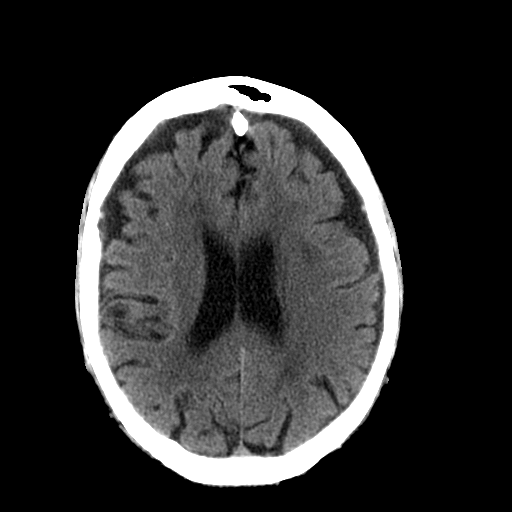
[im 21/32  brain]
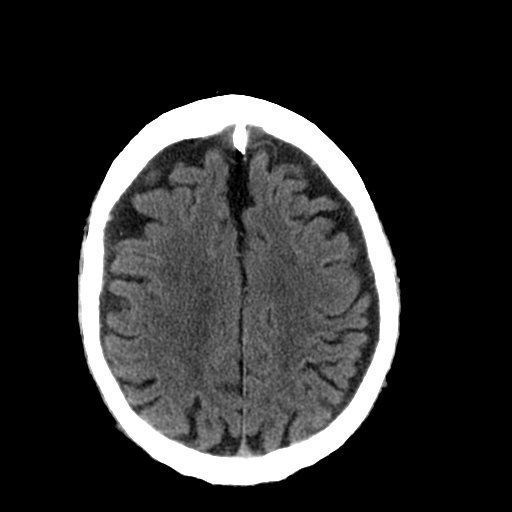
[im 23/32  brain]
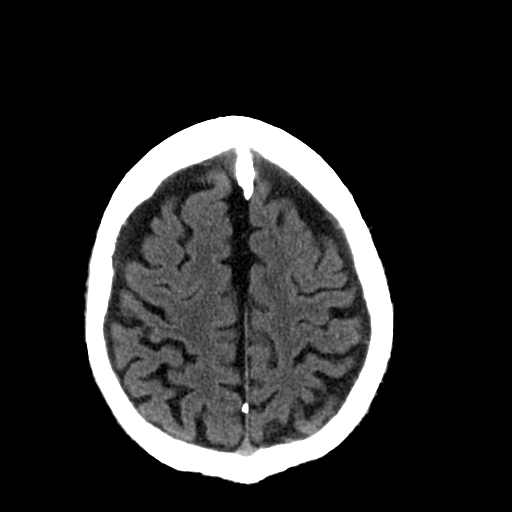
[im 24/32  brain]
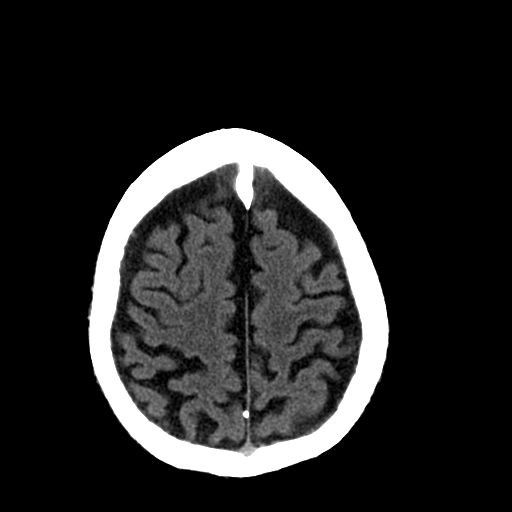
[im 24/32  bone]
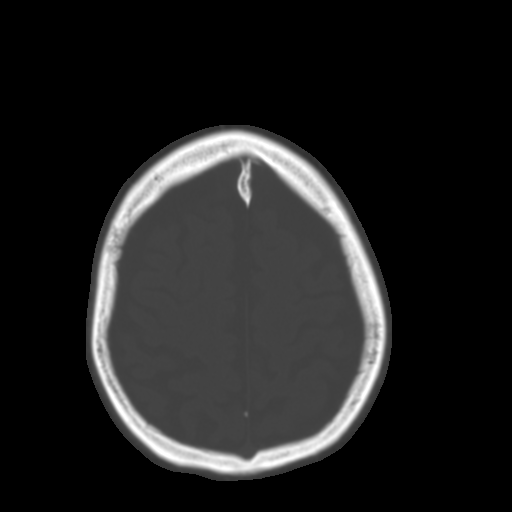
[im 26/32  brain]
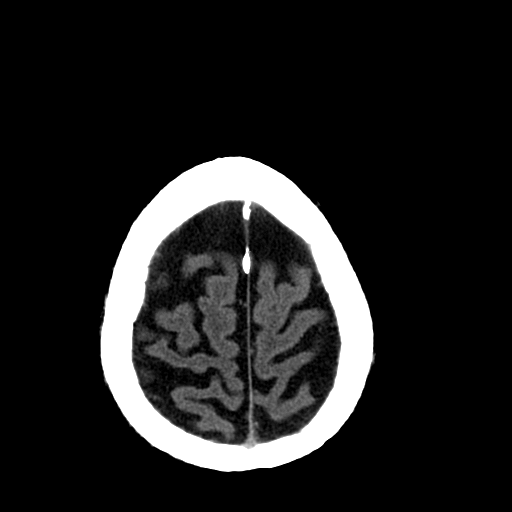
[im 28/32  brain]
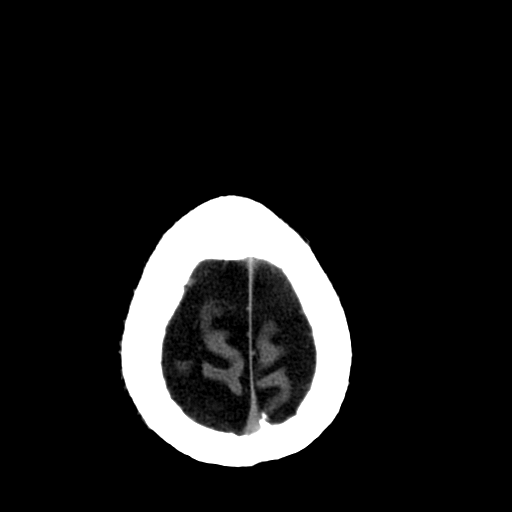
[im 30/32  brain]
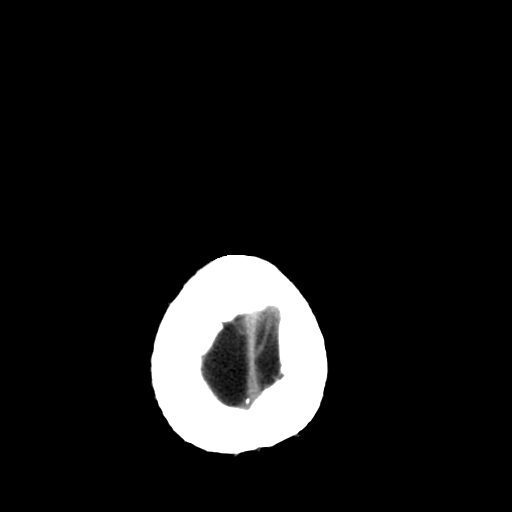

[16 of 30 positions shown; findings below may reference images not displayed]

FINDINGS: Patchy foci of hypoattenuation involving the cerebral
white matter consistent with chronic small vessel ischemic disease.
Cerebral atrophic changes mainly involving the frontal lobes and
anterior temporal lobes.  No acute intracranial findings.  Negative
for hemorrhage, acute infarction, or mass lesion.
IMPRESSION: No acute intracranial findings.  Chronic small vessel disease and
cerebral atrophy are again noted.

## 2010-12-31 ENCOUNTER — Other Ambulatory Visit: Payer: Self-pay | Admitting: Internal Medicine

## 2011-01-03 ENCOUNTER — Ambulatory Visit (INDEPENDENT_AMBULATORY_CARE_PROVIDER_SITE_OTHER): Payer: Medicare Other | Admitting: *Deleted

## 2011-01-03 DIAGNOSIS — Z7901 Long term (current) use of anticoagulants: Secondary | ICD-10-CM

## 2011-01-03 DIAGNOSIS — I4891 Unspecified atrial fibrillation: Secondary | ICD-10-CM

## 2011-01-03 LAB — POCT INR: INR: 2.8

## 2011-01-13 ENCOUNTER — Other Ambulatory Visit: Payer: Self-pay | Admitting: Internal Medicine

## 2011-01-17 ENCOUNTER — Other Ambulatory Visit: Payer: Self-pay

## 2011-01-17 ENCOUNTER — Other Ambulatory Visit: Payer: Self-pay | Admitting: Internal Medicine

## 2011-01-17 DIAGNOSIS — E7800 Pure hypercholesterolemia, unspecified: Secondary | ICD-10-CM

## 2011-01-17 DIAGNOSIS — T887XXA Unspecified adverse effect of drug or medicament, initial encounter: Secondary | ICD-10-CM

## 2011-01-18 ENCOUNTER — Other Ambulatory Visit (INDEPENDENT_AMBULATORY_CARE_PROVIDER_SITE_OTHER): Payer: Medicare Other

## 2011-01-18 DIAGNOSIS — T887XXA Unspecified adverse effect of drug or medicament, initial encounter: Secondary | ICD-10-CM

## 2011-01-18 DIAGNOSIS — E7800 Pure hypercholesterolemia, unspecified: Secondary | ICD-10-CM

## 2011-01-18 LAB — HEPATIC FUNCTION PANEL
ALT: 14 U/L (ref 0–53)
Albumin: 4.3 g/dL (ref 3.5–5.2)
Bilirubin, Direct: 0.3 mg/dL (ref 0.0–0.3)
Total Protein: 7.1 g/dL (ref 6.0–8.3)

## 2011-01-18 LAB — BASIC METABOLIC PANEL
BUN: 27 mg/dL — ABNORMAL HIGH (ref 6–23)
Calcium: 9.5 mg/dL (ref 8.4–10.5)
GFR: 54.75 mL/min — ABNORMAL LOW (ref >60.00)
Potassium: 5.6 mEq/L — ABNORMAL HIGH (ref 3.5–5.1)
Sodium: 139 mEq/L (ref 135–145)

## 2011-01-18 LAB — CBC WITH DIFFERENTIAL/PLATELET
Basophils Absolute: 0 10*3/uL (ref 0.0–0.1)
Eosinophils Relative: 2.6 % (ref 0.0–5.0)
HCT: 47.2 % (ref 39.0–52.0)
Hemoglobin: 15.8 g/dL (ref 13.0–17.0)
Lymphocytes Relative: 21 % (ref 12.0–46.0)
Lymphs Abs: 2 10*3/uL (ref 0.7–4.0)
Monocytes Relative: 7.2 % (ref 3.0–12.0)
Platelets: 104 10*3/uL — ABNORMAL LOW (ref 150.0–400.0)
WBC: 9.7 10*3/uL (ref 4.5–10.5)

## 2011-01-18 LAB — LIPID PANEL
Cholesterol: 160 mg/dL (ref 0–200)
HDL: 30.6 mg/dL — ABNORMAL LOW (ref >39.00)
LDL Cholesterol: 103 mg/dL — ABNORMAL HIGH (ref 0–99)
VLDL: 26.4 mg/dL (ref 0.0–40.0)

## 2011-01-23 ENCOUNTER — Encounter: Payer: Self-pay | Admitting: Internal Medicine

## 2011-01-24 ENCOUNTER — Encounter: Payer: Self-pay | Admitting: Internal Medicine

## 2011-01-24 ENCOUNTER — Ambulatory Visit (INDEPENDENT_AMBULATORY_CARE_PROVIDER_SITE_OTHER): Payer: Medicare Other | Admitting: Internal Medicine

## 2011-01-24 DIAGNOSIS — I10 Essential (primary) hypertension: Secondary | ICD-10-CM

## 2011-01-24 DIAGNOSIS — I2510 Atherosclerotic heart disease of native coronary artery without angina pectoris: Secondary | ICD-10-CM

## 2011-01-24 DIAGNOSIS — I4891 Unspecified atrial fibrillation: Secondary | ICD-10-CM

## 2011-01-24 DIAGNOSIS — R739 Hyperglycemia, unspecified: Secondary | ICD-10-CM

## 2011-01-24 DIAGNOSIS — R7309 Other abnormal glucose: Secondary | ICD-10-CM

## 2011-01-24 DIAGNOSIS — N481 Balanitis: Secondary | ICD-10-CM | POA: Insufficient documentation

## 2011-01-24 DIAGNOSIS — N476 Balanoposthitis: Secondary | ICD-10-CM

## 2011-01-24 DIAGNOSIS — R609 Edema, unspecified: Secondary | ICD-10-CM

## 2011-01-24 MED ORDER — CLOTRIMAZOLE-BETAMETHASONE 1-0.05 % EX CREA: TOPICAL_CREAM | Freq: Two times a day (BID) | CUTANEOUS | Status: DC

## 2011-01-24 NOTE — Assessment & Plan Note (Signed)
Rate controlled 

## 2011-01-24 NOTE — Assessment & Plan Note (Signed)
Better Compr socks

## 2011-01-24 NOTE — Assessment & Plan Note (Signed)
On Rx 

## 2011-01-24 NOTE — Assessment & Plan Note (Signed)
Lotrisone 

## 2011-01-24 NOTE — Progress Notes (Signed)
  Subjective:    Patient ID: John Boozer., male    DOB: 28-Oct-1923, 77 y.o.   MRN: [id number]  HPI  The patient presents for a follow-up of  chronic hypertension, chronic dyslipidemia, type 2 diabetes controlled with diet    Review of Systems  Constitutional: Negative for appetite change, fatigue and unexpected weight change.  HENT: Negative for nosebleeds, congestion, sore throat, sneezing, trouble swallowing and neck pain.   Eyes: Negative for itching and visual disturbance.  Respiratory: Negative for cough.   Cardiovascular: Negative for chest pain, palpitations and leg swelling.  Gastrointestinal: Negative for nausea, diarrhea, blood in stool and abdominal distention.  Genitourinary: Negative for frequency and hematuria.  Musculoskeletal: Negative for back pain, joint swelling and gait problem.  Skin: Negative for rash.  Neurological: Negative for dizziness, tremors, speech difficulty and weakness.  Psychiatric/Behavioral: Negative for sleep disturbance, dysphoric mood and agitation. The patient is not nervous/anxious.        Objective:   Physical Exam  Constitutional: He is oriented to person, place, and time. He appears well-developed.  HENT:  Mouth/Throat: Oropharynx is clear and moist.  Eyes: Conjunctivae are normal. Pupils are equal, round, and reactive to light.  Neck: Normal range of motion. No JVD present. No thyromegaly present.  Cardiovascular: Normal rate, regular rhythm, normal heart sounds and intact distal pulses.  Exam reveals no gallop and no friction rub.   No murmur heard. Pulmonary/Chest: Effort normal and breath sounds normal. No respiratory distress. He has no wheezes. He has no rales. He exhibits no tenderness.  Abdominal: Soft. Bowel sounds are normal. He exhibits no distension and no mass. There is no tenderness. There is no rebound and no guarding.  Musculoskeletal: Normal range of motion. He exhibits edema (mild edema B ). He exhibits no  tenderness.  Lymphadenopathy:    He has no cervical adenopathy.  Neurological: He is alert and oriented to person, place, and time. He has normal reflexes. No cranial nerve deficit. He exhibits normal muscle tone. Coordination normal.  Skin: Skin is warm and dry. Rash (balanitis) noted.       LE purple with spider veins L>R Onycho  Psychiatric: He has a normal mood and affect. His behavior is normal. Judgment and thought content normal.          Assessment & Plan:

## 2011-02-02 ENCOUNTER — Ambulatory Visit (INDEPENDENT_AMBULATORY_CARE_PROVIDER_SITE_OTHER): Payer: Medicare Other | Admitting: *Deleted

## 2011-02-02 DIAGNOSIS — I4891 Unspecified atrial fibrillation: Secondary | ICD-10-CM

## 2011-02-02 DIAGNOSIS — Z7901 Long term (current) use of anticoagulants: Secondary | ICD-10-CM

## 2011-02-02 LAB — POCT INR: INR: 2.4

## 2011-02-06 ENCOUNTER — Other Ambulatory Visit: Payer: Self-pay | Admitting: Dermatology

## 2011-02-24 ENCOUNTER — Other Ambulatory Visit: Payer: Self-pay | Admitting: Internal Medicine

## 2011-03-02 ENCOUNTER — Ambulatory Visit (INDEPENDENT_AMBULATORY_CARE_PROVIDER_SITE_OTHER): Payer: Medicare Other | Admitting: *Deleted

## 2011-03-02 DIAGNOSIS — Z7901 Long term (current) use of anticoagulants: Secondary | ICD-10-CM

## 2011-03-02 DIAGNOSIS — I4891 Unspecified atrial fibrillation: Secondary | ICD-10-CM

## 2011-03-02 LAB — POCT INR: INR: 2.6

## 2011-03-25 ENCOUNTER — Other Ambulatory Visit: Payer: Self-pay | Admitting: Internal Medicine

## 2011-03-30 ENCOUNTER — Other Ambulatory Visit: Payer: Self-pay | Admitting: Cardiology

## 2011-04-03 ENCOUNTER — Ambulatory Visit (INDEPENDENT_AMBULATORY_CARE_PROVIDER_SITE_OTHER): Payer: Medicare Other | Admitting: *Deleted

## 2011-04-03 DIAGNOSIS — Z7901 Long term (current) use of anticoagulants: Secondary | ICD-10-CM

## 2011-04-03 DIAGNOSIS — I4891 Unspecified atrial fibrillation: Secondary | ICD-10-CM

## 2011-04-04 ENCOUNTER — Other Ambulatory Visit: Payer: Self-pay | Admitting: Internal Medicine

## 2011-04-22 ENCOUNTER — Other Ambulatory Visit: Payer: Self-pay | Admitting: Internal Medicine

## 2011-04-23 ENCOUNTER — Other Ambulatory Visit (INDEPENDENT_AMBULATORY_CARE_PROVIDER_SITE_OTHER): Payer: Medicare Other

## 2011-04-23 DIAGNOSIS — I10 Essential (primary) hypertension: Secondary | ICD-10-CM

## 2011-04-23 DIAGNOSIS — R7309 Other abnormal glucose: Secondary | ICD-10-CM

## 2011-04-23 DIAGNOSIS — R739 Hyperglycemia, unspecified: Secondary | ICD-10-CM

## 2011-04-23 DIAGNOSIS — I4891 Unspecified atrial fibrillation: Secondary | ICD-10-CM

## 2011-04-23 LAB — COMPREHENSIVE METABOLIC PANEL
AST: 19 U/L (ref 0–37)
Albumin: 4.1 g/dL (ref 3.5–5.2)
Alkaline Phosphatase: 52 U/L (ref 39–117)
BUN: 25 mg/dL — ABNORMAL HIGH (ref 6–23)
Potassium: 5.1 mEq/L (ref 3.5–5.1)
Sodium: 139 mEq/L (ref 135–145)

## 2011-04-25 ENCOUNTER — Ambulatory Visit (INDEPENDENT_AMBULATORY_CARE_PROVIDER_SITE_OTHER): Payer: Medicare Other | Admitting: Internal Medicine

## 2011-04-25 ENCOUNTER — Encounter: Payer: Self-pay | Admitting: Internal Medicine

## 2011-04-25 DIAGNOSIS — I10 Essential (primary) hypertension: Secondary | ICD-10-CM

## 2011-04-25 DIAGNOSIS — E875 Hyperkalemia: Secondary | ICD-10-CM

## 2011-04-25 DIAGNOSIS — G43109 Migraine with aura, not intractable, without status migrainosus: Secondary | ICD-10-CM | POA: Insufficient documentation

## 2011-04-25 DIAGNOSIS — Z7901 Long term (current) use of anticoagulants: Secondary | ICD-10-CM

## 2011-04-25 DIAGNOSIS — E785 Hyperlipidemia, unspecified: Secondary | ICD-10-CM

## 2011-04-25 DIAGNOSIS — Z8679 Personal history of other diseases of the circulatory system: Secondary | ICD-10-CM

## 2011-04-25 DIAGNOSIS — I4891 Unspecified atrial fibrillation: Secondary | ICD-10-CM

## 2011-04-25 MED ORDER — KETOCONAZOLE 2 % EX CREA: TOPICAL_CREAM | Freq: Every day | CUTANEOUS | Status: DC

## 2011-04-25 NOTE — Assessment & Plan Note (Signed)
Continue with current prescription therapy as reflected on the Med list.  

## 2011-04-25 NOTE — Progress Notes (Signed)
  Subjective:    Patient ID: John Boozer., male    DOB: 03/24/1924, 77 y.o.   MRN: [id number]  HPI  The patient presents for a follow-up of  chronic hypertension, CAD, chronic dyslipidemia, TIAs controlled with medicines He may have had a TIA vs aura last night (30 years)    Review of Systems  Constitutional: Negative for appetite change, fatigue and unexpected weight change.  HENT: Negative for nosebleeds, congestion, sore throat, sneezing, trouble swallowing and neck pain.   Eyes: Negative for itching and visual disturbance.  Respiratory: Negative for cough.   Cardiovascular: Negative for chest pain, palpitations and leg swelling.  Gastrointestinal: Negative for nausea, diarrhea, blood in stool and abdominal distention.  Genitourinary: Negative for frequency and hematuria.  Musculoskeletal: Negative for back pain, joint swelling and gait problem.  Skin: Negative for rash.  Neurological: Negative for dizziness, tremors, speech difficulty and weakness.  Psychiatric/Behavioral: Negative for sleep disturbance, dysphoric mood and agitation. The patient is not nervous/anxious.    Wt Readings from Last 3 Encounters:  04/25/11 210 lb (95.255 kg)  01/24/11 214 lb (97.07 kg)  10/25/10 210 lb (95.255 kg)        Objective:   Physical Exam  Constitutional: He is oriented to person, place, and time. He appears well-developed.  HENT:  Mouth/Throat: Oropharynx is clear and moist.  Eyes: Conjunctivae are normal. Pupils are equal, round, and reactive to light.  Neck: Normal range of motion. No JVD present. No thyromegaly present.  Cardiovascular: Normal rate, normal heart sounds and intact distal pulses.  Exam reveals no gallop and no friction rub.   No murmur heard.      irreg  Pulmonary/Chest: Effort normal and breath sounds normal. No respiratory distress. He has no wheezes. He has no rales. He exhibits no tenderness.  Abdominal: Soft. Bowel sounds are normal. He exhibits no  distension and no mass. There is no tenderness. There is no rebound and no guarding.  Musculoskeletal: Normal range of motion. He exhibits edema (trace). He exhibits no tenderness.  Lymphadenopathy:    He has no cervical adenopathy.  Neurological: He is alert and oriented to person, place, and time. He has normal reflexes. No cranial nerve deficit. He exhibits normal muscle tone. Coordination normal.  Skin: Skin is warm and dry. No rash noted.  Psychiatric: He has a normal mood and affect. His behavior is normal. Judgment and thought content normal.    Lab Results  Component Value Date   WBC 9.7 01/18/2011   HGB 15.8 01/18/2011   HCT 47.2 01/18/2011   PLT 104.0* 01/18/2011   CHOL 160 01/18/2011   TRIG 132.0 01/18/2011   HDL 30.60* 01/18/2011   ALT 16 04/23/2011   AST 19 04/23/2011   NA 139 04/23/2011   K 5.1 04/23/2011   CL 103 04/23/2011   CREATININE 1.4 04/23/2011   BUN 25* 04/23/2011   CO2 26 04/23/2011   TSH 1.52 07/19/2010   PSA 1.25 07/19/2010   INR 2.3 04/03/2011   HGBA1C 6.1 04/23/2011         Assessment & Plan:

## 2011-04-25 NOTE — Assessment & Plan Note (Signed)
Continue with current prescription therapy as reflected on the Med list. Reduce Toprol to bid if BP is low Take asa 1 po if re-occurs

## 2011-04-25 NOTE — Assessment & Plan Note (Signed)
Most recent 9/12 x30 years Sounds more like migraine aura w/o HA attacks than TIA

## 2011-04-25 NOTE — Assessment & Plan Note (Signed)
Continue with current prescription therapy as reflected on the Med list - [facility].  

## 2011-04-25 NOTE — Assessment & Plan Note (Signed)
Resolved

## 2011-05-01 ENCOUNTER — Ambulatory Visit (INDEPENDENT_AMBULATORY_CARE_PROVIDER_SITE_OTHER): Payer: Medicare Other | Admitting: *Deleted

## 2011-05-01 DIAGNOSIS — I4891 Unspecified atrial fibrillation: Secondary | ICD-10-CM

## 2011-05-01 DIAGNOSIS — Z7901 Long term (current) use of anticoagulants: Secondary | ICD-10-CM

## 2011-05-03 LAB — CBC
HCT: 37.4 % — ABNORMAL LOW (ref 39.0–52.0)
HCT: 40.1 % (ref 39.0–52.0)
HCT: 40.8 % (ref 39.0–52.0)
MCHC: 33 g/dL (ref 30.0–36.0)
MCHC: 33.2 g/dL (ref 30.0–36.0)
MCV: 88.6 fL (ref 78.0–100.0)
MCV: 88.9 fL (ref 78.0–100.0)
MCV: 89.3 fL (ref 78.0–100.0)
Platelets: 134 10*3/uL — ABNORMAL LOW (ref 150–400)
Platelets: 146 10*3/uL — ABNORMAL LOW (ref 150–400)
Platelets: 210 10*3/uL (ref 150–400)
RBC: 4.59 MIL/uL (ref 4.22–5.81)
RDW: 15.2 % (ref 11.5–15.5)
WBC: 13.6 10*3/uL — ABNORMAL HIGH (ref 4.0–10.5)

## 2011-05-03 LAB — COMPREHENSIVE METABOLIC PANEL
AST: 49 U/L — ABNORMAL HIGH (ref 0–37)
Albumin: 1.9 g/dL — ABNORMAL LOW (ref 3.5–5.2)
Albumin: 2.5 g/dL — ABNORMAL LOW (ref 3.5–5.2)
BUN: 18 mg/dL (ref 6–23)
BUN: 37 mg/dL — ABNORMAL HIGH (ref 6–23)
Calcium: 8 mg/dL — ABNORMAL LOW (ref 8.4–10.5)
Chloride: 99 mEq/L (ref 96–112)
Creatinine, Ser: 1.13 mg/dL (ref 0.4–1.5)
Creatinine, Ser: 1.61 mg/dL — ABNORMAL HIGH (ref 0.4–1.5)
GFR calc Af Amer: 50 mL/min — ABNORMAL LOW (ref >60)
GFR calc non Af Amer: 41 mL/min — ABNORMAL LOW (ref >60)
Glucose, Bld: 158 mg/dL — ABNORMAL HIGH (ref 70–99)
Total Bilirubin: 2.7 mg/dL — ABNORMAL HIGH (ref 0.3–1.2)
Total Protein: 5.2 g/dL — ABNORMAL LOW (ref 6.0–8.3)

## 2011-05-03 LAB — CULTURE, BLOOD (ROUTINE X 2)

## 2011-05-03 LAB — URINE CULTURE
Colony Count: NO GROWTH
Culture: NO GROWTH
Special Requests: NEGATIVE

## 2011-05-03 LAB — BASIC METABOLIC PANEL
Calcium: 8.1 mg/dL — ABNORMAL LOW (ref 8.4–10.5)
Creatinine, Ser: 0.89 mg/dL (ref 0.4–1.5)
GFR calc Af Amer: >60 mL/min (ref >60)
GFR calc non Af Amer: >60 mL/min (ref >60)
Glucose, Bld: 146 mg/dL — ABNORMAL HIGH (ref 70–99)
Sodium: 137 mEq/L (ref 135–145)

## 2011-05-03 LAB — PROTIME-INR
INR: 2 — ABNORMAL HIGH (ref 0.00–1.49)
INR: 2.3 — ABNORMAL HIGH (ref 0.00–1.49)
INR: 2.6 — ABNORMAL HIGH (ref 0.00–1.49)
Prothrombin Time: 24.3 seconds — ABNORMAL HIGH (ref 11.6–15.2)
Prothrombin Time: 26.1 seconds — ABNORMAL HIGH (ref 11.6–15.2)

## 2011-05-03 LAB — URINALYSIS, ROUTINE W REFLEX MICROSCOPIC
Glucose, UA: NEGATIVE mg/dL
Ketones, ur: NEGATIVE mg/dL
Leukocytes, UA: NEGATIVE
Protein, ur: 100 mg/dL — AB

## 2011-05-03 LAB — B-NATRIURETIC PEPTIDE (CONVERTED LAB): Pro B Natriuretic peptide (BNP): 225 pg/mL — ABNORMAL HIGH (ref 0.0–100.0)

## 2011-05-03 LAB — URINE MICROSCOPIC-ADD ON

## 2011-05-09 ENCOUNTER — Encounter: Payer: Medicare Other | Admitting: Hematology & Oncology

## 2011-05-09 ENCOUNTER — Other Ambulatory Visit: Payer: Self-pay | Admitting: Hematology & Oncology

## 2011-05-09 LAB — CBC WITH DIFFERENTIAL (CANCER CENTER ONLY)
BASO#: 0 10*3/uL (ref 0.0–0.2)
HCT: 46.2 % (ref 38.7–49.9)
HGB: 15.6 g/dL (ref 13.0–17.1)
LYMPH#: 1.9 10*3/uL (ref 0.9–3.3)
MONO#: 0.7 10*3/uL (ref 0.1–0.9)
NEUT%: 66.3 % (ref 40.0–80.0)
WBC: 8.8 10*3/uL (ref 4.0–10.0)

## 2011-05-16 ENCOUNTER — Ambulatory Visit (INDEPENDENT_AMBULATORY_CARE_PROVIDER_SITE_OTHER): Payer: Medicare Other | Admitting: Internal Medicine

## 2011-05-16 ENCOUNTER — Encounter: Payer: Self-pay | Admitting: Internal Medicine

## 2011-05-16 DIAGNOSIS — I10 Essential (primary) hypertension: Secondary | ICD-10-CM

## 2011-05-16 DIAGNOSIS — I4891 Unspecified atrial fibrillation: Secondary | ICD-10-CM

## 2011-05-16 DIAGNOSIS — R55 Syncope and collapse: Secondary | ICD-10-CM

## 2011-05-16 NOTE — Patient Instructions (Signed)
Your physician wants you to follow-up in: 1 year with Dr. Taylor. You will receive a reminder letter in the mail two months in advance. If you don't receive a letter, please call our office to schedule the follow-up appointment.  Your physician recommends that you continue on your current medications as directed. Please refer to the Current Medication list given to you today.  

## 2011-05-16 NOTE — Assessment & Plan Note (Signed)
His blood pressure appears to be well controlled. He will continue his current medical therapy. 

## 2011-05-16 NOTE — Assessment & Plan Note (Signed)
His ventricular rate appears to be well-controlled and he is asymptomatic. He will continue his current medical therapy. 

## 2011-05-16 NOTE — Progress Notes (Signed)
HPI [name] times today for followup. He is an 77 year old man with a history of chronic atrial fibrillation as well as orthostasis. He has had no syncope in over 2 years. The patient has rare palpitations. He denies chest pain but has some dyspnea with exertion. He has no peripheral edema. He notes one episode where his vision changed for several minutes but resolved spontaneously. He walks on a daily basis between 15 and 20 minutes. Much of his time is spent taking care of his wife who has Parkinson's disease and dementia.  Allergies  Allergen Reactions  . Polysporin (Bacitracin-Polymyxin B) Other (See Comments)    Doesn't remember reaction     Current Outpatient Prescriptions  Medication Sig Dispense Refill  . amLODipine (NORVASC) 2.5 MG tablet TAKE 1 TABLET DAILY AT BEDTIME  90 tablet  1  . Cholecalciferol (VITAMIN D) 1000 UNITS capsule Take 1,000 Units by mouth daily.        Marland Kitchen ketoconazole (NIZORAL) 2 % cream Apply topically daily.  45 g  1  . metoprolol (TOPROL-XL) 50 MG 24 hr tablet        . Multiple Vitamins-Minerals (ICAPS MV) TABS Take 1 tablet by mouth daily.        Marland Kitchen omeprazole (PRILOSEC) 40 MG capsule TAKE 1 CAPSULE ONCE DAILY  90 capsule  1  . polyethylene glycol powder (GLYCOLAX/MIRALAX) powder USE 1 SCOOP ONCE DAILY AS NEEDED  527 g  0  . pravastatin (PRAVACHOL) 40 MG tablet TAKE 1 TABLET DAILY  90 tablet  2  . spironolactone (ALDACTONE) 25 MG tablet TAKE 1 TABLET DAILY  90 tablet  2  . Tamsulosin HCl (FLOMAX) 0.4 MG CAPS daily.       Marland Kitchen warfarin (COUMADIN) 2.5 MG tablet TAKE AS DIRECTED BY ANTICOAGULATION CLINIC  90 tablet  3     Past Medical History  Diagnosis Date  . Anemia     low blood platelets  . Arthritis   . Blood transfusion   . Cancer     skin CA  . Cataract   . Heart murmur   . Hyperlipidemia   . Hypertension   . TIA (transient ischemic attack)   . Atrial fibrillation     on Coumadin  . Myocardial infarction     after colon surgery     ROS:   All systems reviewed and negative except as noted in the HPI.   Past Surgical History  Procedure Date  . Colon surgery     partial colectomy  . Total hip arthroplasty     LEFT HIP  . Coronary artery bypass graft     3 vessels  . Heart stent   . Thoracotomy   . Lens implants      Family History  Problem Relation Age of Onset  . Colon polyps Mother   . Stroke Father   . Stroke Other      History   Social History  . Marital Status: Married    Spouse Name: John [name]    Number of Children: N/A  . Years of Education: N/A   Occupational History  .     Social History Main Topics  . Smoking status: Former Smoker    Quit date: 10/28/1965  . Smokeless tobacco: Not on file  . Alcohol Use: 0.0 oz/week  . Drug Use: Not on file  . Sexually Active: Not on file   Other Topics Concern  . Not on file   Social History Narrative  .  No narrative on file     BP 112/70  Pulse 81  Ht 6' (1.829 m)  Wt 211 lb 6.4 oz (95.89 kg)  BMI 28.67 kg/m2  Physical Exam:  Well appearing Elderly man, NAD HEENT: Unremarkable Neck:  No JVD, no thyromegally Lymphatics:  No adenopathy Back:  No CVA tenderness Lungs:  Clear no wheezes, rales, or rhonchi. HEART:  Iregular rate rhythm, no murmurs, no rubs, no clicks Abd:  soft, positive bowel sounds, no organomegally, no rebound, no guarding Ext:  2 plus pulses, no edema, no cyanosis, no clubbing Skin:  No rashes no nodules Neuro:  CN II through XII intact, motor grossly intact  EKG Atrial fibrillation with a controlled ventricular response.  Assess/Plan:

## 2011-05-29 ENCOUNTER — Ambulatory Visit (INDEPENDENT_AMBULATORY_CARE_PROVIDER_SITE_OTHER): Payer: Medicare Other | Admitting: *Deleted

## 2011-05-29 DIAGNOSIS — I4891 Unspecified atrial fibrillation: Secondary | ICD-10-CM

## 2011-05-29 DIAGNOSIS — Z7901 Long term (current) use of anticoagulants: Secondary | ICD-10-CM

## 2011-07-09 ENCOUNTER — Other Ambulatory Visit: Payer: Self-pay | Admitting: Internal Medicine

## 2011-07-10 ENCOUNTER — Ambulatory Visit (INDEPENDENT_AMBULATORY_CARE_PROVIDER_SITE_OTHER): Payer: Medicare Other | Admitting: *Deleted

## 2011-07-10 DIAGNOSIS — I4891 Unspecified atrial fibrillation: Secondary | ICD-10-CM

## 2011-07-10 DIAGNOSIS — Z7901 Long term (current) use of anticoagulants: Secondary | ICD-10-CM

## 2011-07-10 LAB — POCT INR: INR: 1.9

## 2011-08-08 ENCOUNTER — Ambulatory Visit: Payer: Medicare Other | Admitting: Internal Medicine

## 2011-08-21 ENCOUNTER — Ambulatory Visit (INDEPENDENT_AMBULATORY_CARE_PROVIDER_SITE_OTHER): Payer: Medicare Other | Admitting: Pharmacist

## 2011-08-21 DIAGNOSIS — I4891 Unspecified atrial fibrillation: Secondary | ICD-10-CM

## 2011-08-21 DIAGNOSIS — Z7901 Long term (current) use of anticoagulants: Secondary | ICD-10-CM

## 2011-08-21 LAB — POCT INR: INR: 2.3

## 2011-08-30 ENCOUNTER — Other Ambulatory Visit: Payer: Self-pay | Admitting: Internal Medicine

## 2011-09-17 ENCOUNTER — Other Ambulatory Visit: Payer: Self-pay

## 2011-09-17 ENCOUNTER — Other Ambulatory Visit: Payer: Self-pay | Admitting: Internal Medicine

## 2011-09-17 MED ORDER — AMLODIPINE BESYLATE 2.5 MG PO TABS: 2.5000 mg | ORAL_TABLET | Freq: Every day | ORAL | Status: DC

## 2011-09-19 ENCOUNTER — Other Ambulatory Visit: Payer: Self-pay | Admitting: Internal Medicine

## 2011-10-02 ENCOUNTER — Ambulatory Visit (INDEPENDENT_AMBULATORY_CARE_PROVIDER_SITE_OTHER): Payer: Medicare Other | Admitting: *Deleted

## 2011-10-02 DIAGNOSIS — Z7901 Long term (current) use of anticoagulants: Secondary | ICD-10-CM

## 2011-10-02 DIAGNOSIS — I4891 Unspecified atrial fibrillation: Secondary | ICD-10-CM

## 2011-10-24 ENCOUNTER — Ambulatory Visit (INDEPENDENT_AMBULATORY_CARE_PROVIDER_SITE_OTHER): Payer: Medicare Other | Admitting: Internal Medicine

## 2011-10-24 ENCOUNTER — Encounter: Payer: Self-pay | Admitting: Internal Medicine

## 2011-10-24 VITALS — BP 130/80 | HR 80 | Temp 98.4°F | Resp 16 | Wt 213.0 lb

## 2011-10-24 DIAGNOSIS — I10 Essential (primary) hypertension: Secondary | ICD-10-CM

## 2011-10-24 DIAGNOSIS — I4891 Unspecified atrial fibrillation: Secondary | ICD-10-CM

## 2011-10-24 DIAGNOSIS — I2510 Atherosclerotic heart disease of native coronary artery without angina pectoris: Secondary | ICD-10-CM

## 2011-10-24 DIAGNOSIS — E785 Hyperlipidemia, unspecified: Secondary | ICD-10-CM

## 2011-10-24 MED ORDER — POLYETHYLENE GLYCOL 3350 17 GM/SCOOP PO POWD: 17.0000 g | Freq: Every day | ORAL | Status: DC

## 2011-10-24 MED ORDER — METOPROLOL SUCCINATE ER 50 MG PO TB24: 25.0000 mg | ORAL_TABLET | Freq: Two times a day (BID) | ORAL | Status: DC

## 2011-10-24 NOTE — Assessment & Plan Note (Signed)
Continue with current prescription therapy as reflected on the Med list.  

## 2011-10-24 NOTE — Progress Notes (Signed)
Patient ID: John Boozer., male   DOB: [birth year], 78 y.o.   MRN: [id number]  Subjective:    Patient ID: John Boozer., male    DOB: 1924/02/20, 78 y.o.   MRN: [id number]  HPI  The patient presents for a follow-up of  chronic hypertension, CAD, chronic dyslipidemia, TIAs controlled with medicines He may have had a TIA vs aura last night (30 years)    Review of Systems  Constitutional: Negative for appetite change, fatigue and unexpected weight change.  HENT: Negative for nosebleeds, congestion, sore throat, sneezing, trouble swallowing and neck pain.   Eyes: Negative for itching and visual disturbance.  Respiratory: Negative for cough.   Cardiovascular: Negative for chest pain, palpitations and leg swelling.  Gastrointestinal: Negative for nausea, diarrhea, blood in stool and abdominal distention.  Genitourinary: Negative for frequency and hematuria.  Musculoskeletal: Negative for back pain, joint swelling and gait problem.  Skin: Negative for rash.  Neurological: Negative for dizziness, tremors, speech difficulty and weakness.  Psychiatric/Behavioral: Negative for sleep disturbance, dysphoric mood and agitation. The patient is not nervous/anxious.    Wt Readings from Last 3 Encounters:  10/24/11 213 lb (96.616 kg)  05/16/11 211 lb 6.4 oz (95.89 kg)  04/25/11 210 lb (95.255 kg)        Objective:   Physical Exam  Constitutional: He is oriented to person, place, and time. He appears well-developed.  HENT:  Mouth/Throat: Oropharynx is clear and moist.  Eyes: Conjunctivae are normal. Pupils are equal, round, and reactive to light.  Neck: Normal range of motion. No JVD present. No thyromegaly present.  Cardiovascular: Normal rate, normal heart sounds and intact distal pulses.  Exam reveals no gallop and no friction rub.   No murmur heard.      irreg  Pulmonary/Chest: Effort normal and breath sounds normal. No respiratory distress. He has no wheezes. He has no rales. He  exhibits no tenderness.  Abdominal: Soft. Bowel sounds are normal. He exhibits no distension and no mass. There is no tenderness. There is no rebound and no guarding.  Musculoskeletal: Normal range of motion. He exhibits edema (trace). He exhibits no tenderness.  Lymphadenopathy:    He has no cervical adenopathy.  Neurological: He is alert and oriented to person, place, and time. He has normal reflexes. No cranial nerve deficit. He exhibits normal muscle tone. Coordination normal.  Skin: Skin is warm and dry. No rash noted.  Psychiatric: He has a normal mood and affect. His behavior is normal. Judgment and thought content normal.    Lab Results  Component Value Date   WBC 8.8 05/09/2011   HGB 15.6 05/09/2011   HCT 46.2 05/09/2011   PLT 98* 05/09/2011   CHOL 160 01/18/2011   TRIG 132.0 01/18/2011   HDL 30.60* 01/18/2011   ALT 16 04/23/2011   AST 19 04/23/2011   NA 139 04/23/2011   K 5.1 04/23/2011   CL 103 04/23/2011   CREATININE 1.4 04/23/2011   BUN 25* 04/23/2011   CO2 26 04/23/2011   TSH 1.52 07/19/2010   PSA 1.25 07/19/2010   INR 2.1 10/02/2011   HGBA1C 6.1 04/23/2011         Assessment & Plan:

## 2011-10-26 ENCOUNTER — Encounter: Payer: Self-pay | Admitting: Internal Medicine

## 2011-10-26 NOTE — Assessment & Plan Note (Signed)
Continue with current prescription therapy as reflected on the Med list.  

## 2011-11-01 ENCOUNTER — Other Ambulatory Visit (INDEPENDENT_AMBULATORY_CARE_PROVIDER_SITE_OTHER): Payer: Medicare Other

## 2011-11-01 DIAGNOSIS — I10 Essential (primary) hypertension: Secondary | ICD-10-CM

## 2011-11-01 DIAGNOSIS — E785 Hyperlipidemia, unspecified: Secondary | ICD-10-CM

## 2011-11-01 LAB — HEPATIC FUNCTION PANEL
ALT: 14 U/L (ref 0–53)
Albumin: 4 g/dL (ref 3.5–5.2)
Alkaline Phosphatase: 51 U/L (ref 39–117)
Bilirubin, Direct: 0.2 mg/dL (ref 0.0–0.3)
Total Protein: 7.4 g/dL (ref 6.0–8.3)

## 2011-11-01 LAB — LIPID PANEL
HDL: 33.7 mg/dL — ABNORMAL LOW (ref >39.00)
Total CHOL/HDL Ratio: 5
Triglycerides: 115 mg/dL (ref 0.0–149.0)

## 2011-11-01 LAB — BASIC METABOLIC PANEL WITH GFR
BUN: 30 mg/dL — ABNORMAL HIGH (ref 6–23)
CO2: 28 meq/L (ref 19–32)
Calcium: 9.7 mg/dL (ref 8.4–10.5)
Chloride: 106 meq/L (ref 96–112)
Creatinine, Ser: 1.4 mg/dL (ref 0.4–1.5)
GFR: 51.06 mL/min — ABNORMAL LOW (ref >60.00)
Glucose, Bld: 125 mg/dL — ABNORMAL HIGH (ref 70–99)
Potassium: 5.6 meq/L — ABNORMAL HIGH (ref 3.5–5.1)
Sodium: 140 meq/L (ref 135–145)

## 2011-11-13 ENCOUNTER — Ambulatory Visit (INDEPENDENT_AMBULATORY_CARE_PROVIDER_SITE_OTHER): Payer: Medicare Other | Admitting: Pharmacist

## 2011-11-13 DIAGNOSIS — I4891 Unspecified atrial fibrillation: Secondary | ICD-10-CM

## 2011-11-13 DIAGNOSIS — Z7901 Long term (current) use of anticoagulants: Secondary | ICD-10-CM

## 2011-11-13 LAB — POCT INR: INR: 2.3

## 2011-11-23 ENCOUNTER — Telehealth: Payer: Self-pay | Admitting: Internal Medicine

## 2011-11-23 ENCOUNTER — Other Ambulatory Visit: Payer: Self-pay | Admitting: *Deleted

## 2011-11-23 DIAGNOSIS — E875 Hyperkalemia: Secondary | ICD-10-CM

## 2011-11-23 DIAGNOSIS — D696 Thrombocytopenia, unspecified: Secondary | ICD-10-CM

## 2011-11-23 MED ORDER — POLYETHYLENE GLYCOL 3350 17 GM/SCOOP PO POWD: 17.0000 g | Freq: Every day | ORAL | Status: DC

## 2011-11-23 NOTE — Telephone Encounter (Signed)
F/u on labs - discussed Stop having a banana for breakfast Labs in 2 mo

## 2011-12-25 ENCOUNTER — Ambulatory Visit (INDEPENDENT_AMBULATORY_CARE_PROVIDER_SITE_OTHER): Payer: Medicare Other | Admitting: *Deleted

## 2011-12-25 DIAGNOSIS — I4891 Unspecified atrial fibrillation: Secondary | ICD-10-CM

## 2011-12-25 DIAGNOSIS — Z7901 Long term (current) use of anticoagulants: Secondary | ICD-10-CM

## 2011-12-26 ENCOUNTER — Other Ambulatory Visit: Payer: Self-pay | Admitting: Internal Medicine

## 2012-02-05 ENCOUNTER — Ambulatory Visit (INDEPENDENT_AMBULATORY_CARE_PROVIDER_SITE_OTHER): Payer: Medicare Other | Admitting: *Deleted

## 2012-02-05 DIAGNOSIS — I4891 Unspecified atrial fibrillation: Secondary | ICD-10-CM

## 2012-02-05 DIAGNOSIS — Z7901 Long term (current) use of anticoagulants: Secondary | ICD-10-CM

## 2012-02-05 LAB — POCT INR: INR: 2.5

## 2012-02-15 ENCOUNTER — Encounter: Payer: Self-pay | Admitting: Gastroenterology

## 2012-02-23 ENCOUNTER — Other Ambulatory Visit: Payer: Self-pay | Admitting: Internal Medicine

## 2012-03-18 ENCOUNTER — Ambulatory Visit (INDEPENDENT_AMBULATORY_CARE_PROVIDER_SITE_OTHER): Payer: Medicare Other | Admitting: *Deleted

## 2012-03-18 DIAGNOSIS — I4891 Unspecified atrial fibrillation: Secondary | ICD-10-CM

## 2012-03-18 DIAGNOSIS — Z7901 Long term (current) use of anticoagulants: Secondary | ICD-10-CM

## 2012-04-25 ENCOUNTER — Encounter: Payer: Self-pay | Admitting: Internal Medicine

## 2012-04-25 ENCOUNTER — Other Ambulatory Visit (INDEPENDENT_AMBULATORY_CARE_PROVIDER_SITE_OTHER): Payer: Medicare Other

## 2012-04-25 ENCOUNTER — Ambulatory Visit (INDEPENDENT_AMBULATORY_CARE_PROVIDER_SITE_OTHER): Payer: Medicare Other | Admitting: Internal Medicine

## 2012-04-25 VITALS — BP 112/70 | HR 80 | Temp 98.1°F | Resp 16 | Wt 208.0 lb

## 2012-04-25 DIAGNOSIS — E875 Hyperkalemia: Secondary | ICD-10-CM

## 2012-04-25 DIAGNOSIS — D696 Thrombocytopenia, unspecified: Secondary | ICD-10-CM

## 2012-04-25 DIAGNOSIS — G43109 Migraine with aura, not intractable, without status migrainosus: Secondary | ICD-10-CM

## 2012-04-25 DIAGNOSIS — I10 Essential (primary) hypertension: Secondary | ICD-10-CM

## 2012-04-25 DIAGNOSIS — Z8679 Personal history of other diseases of the circulatory system: Secondary | ICD-10-CM

## 2012-04-25 DIAGNOSIS — M109 Gout, unspecified: Secondary | ICD-10-CM

## 2012-04-25 DIAGNOSIS — I2510 Atherosclerotic heart disease of native coronary artery without angina pectoris: Secondary | ICD-10-CM

## 2012-04-25 DIAGNOSIS — E785 Hyperlipidemia, unspecified: Secondary | ICD-10-CM

## 2012-04-25 LAB — CBC WITH DIFFERENTIAL/PLATELET
Eosinophils Relative: 1.9 % (ref 0.0–5.0)
HCT: 46.7 % (ref 39.0–52.0)
Hemoglobin: 15.3 g/dL (ref 13.0–17.0)
Lymphocytes Relative: 16.9 % (ref 12.0–46.0)
Lymphs Abs: 1.8 10*3/uL (ref 0.7–4.0)
Monocytes Relative: 6.1 % (ref 3.0–12.0)
Neutro Abs: 7.8 10*3/uL — ABNORMAL HIGH (ref 1.4–7.7)
WBC: 10.4 10*3/uL (ref 4.5–10.5)

## 2012-04-25 LAB — BASIC METABOLIC PANEL
GFR: 44.36 mL/min — ABNORMAL LOW (ref >60.00)
Potassium: 5.3 mEq/L — ABNORMAL HIGH (ref 3.5–5.1)
Sodium: 141 mEq/L (ref 135–145)

## 2012-04-25 NOTE — Progress Notes (Signed)
  Subjective:    Patient ID: John Boozer., male    DOB: [birth year], 78 y.o.   MRN: [id number]  HPI  The patient presents for a follow-up of  chronic hypertension, CAD, chronic dyslipidemia, TIAs controlled with medicines  He may have had a TIA vs aura last night (30 years)    Review of Systems  Constitutional: Negative for appetite change, fatigue and unexpected weight change.  HENT: Negative for nosebleeds, congestion, sore throat, sneezing, trouble swallowing and neck pain.   Eyes: Negative for itching and visual disturbance.  Respiratory: Negative for cough.   Cardiovascular: Negative for chest pain, palpitations and leg swelling.  Gastrointestinal: Negative for nausea, diarrhea, blood in stool and abdominal distention.  Genitourinary: Negative for frequency and hematuria.  Musculoskeletal: Negative for back pain, joint swelling and gait problem.  Skin: Negative for rash.  Neurological: Negative for dizziness, tremors, speech difficulty and weakness.  Psychiatric/Behavioral: Negative for disturbed wake/sleep cycle, dysphoric mood and agitation. The patient is not nervous/anxious.    Wt Readings from Last 3 Encounters:  04/25/12 208 lb (94.348 kg)  10/24/11 213 lb (96.616 kg)  05/16/11 211 lb 6.4 oz (95.89 kg)        Objective:   Physical Exam  Constitutional: He is oriented to person, place, and time. He appears well-developed.  HENT:  Mouth/Throat: Oropharynx is clear and moist.  Eyes: Conjunctivae normal are normal. Pupils are equal, round, and reactive to light.  Neck: Normal range of motion. No JVD present. No thyromegaly present.  Cardiovascular: Normal rate, normal heart sounds and intact distal pulses.  Exam reveals no gallop and no friction rub.   No murmur heard.      irreg  Pulmonary/Chest: Effort normal and breath sounds normal. No respiratory distress. He has no wheezes. He has no rales. He exhibits no tenderness.  Abdominal: Soft. Bowel sounds are  normal. He exhibits no distension and no mass. There is no tenderness. There is no rebound and no guarding.  Musculoskeletal: Normal range of motion. He exhibits edema (trace). He exhibits no tenderness.  Lymphadenopathy:    He has no cervical adenopathy.  Neurological: He is alert and oriented to person, place, and time. He has normal reflexes. No cranial nerve deficit. He exhibits normal muscle tone. Coordination normal.  Skin: Skin is warm and dry. No rash noted.  Psychiatric: He has a normal mood and affect. His behavior is normal. Judgment and thought content normal.    Lab Results  Component Value Date   WBC 8.8 05/09/2011   HGB 15.6 05/09/2011   HCT 46.2 05/09/2011   PLT 98* 05/09/2011   CHOL 152 11/01/2011   TRIG 115.0 11/01/2011   HDL 33.70* 11/01/2011   ALT 14 11/01/2011   AST 20 11/01/2011   NA 140 11/01/2011   K 5.6* 11/01/2011   CL 106 11/01/2011   CREATININE 1.4 11/01/2011   BUN 30* 11/01/2011   CO2 28 11/01/2011   TSH 1.52 07/19/2010   PSA 1.25 07/19/2010   INR 2.3 03/18/2012   HGBA1C 6.1 04/23/2011         Assessment & Plan:

## 2012-04-25 NOTE — Assessment & Plan Note (Signed)
Continue with current prescription therapy as reflected on the Med list.  

## 2012-04-25 NOTE — Assessment & Plan Note (Signed)
Continue with current prn therapy as reflected on the Med list.  

## 2012-04-27 ENCOUNTER — Encounter: Payer: Self-pay | Admitting: Internal Medicine

## 2012-04-27 ENCOUNTER — Other Ambulatory Visit: Payer: Self-pay | Admitting: Internal Medicine

## 2012-04-27 ENCOUNTER — Telehealth: Payer: Self-pay | Admitting: Internal Medicine

## 2012-04-27 NOTE — Telephone Encounter (Signed)
John [name], please, inform patient that all labs are normal except for elev K Stop spironolactone Thx

## 2012-04-28 NOTE — Telephone Encounter (Signed)
Pt informed

## 2012-04-29 ENCOUNTER — Ambulatory Visit (INDEPENDENT_AMBULATORY_CARE_PROVIDER_SITE_OTHER): Payer: Medicare Other

## 2012-04-29 DIAGNOSIS — Z7901 Long term (current) use of anticoagulants: Secondary | ICD-10-CM

## 2012-04-29 DIAGNOSIS — I4891 Unspecified atrial fibrillation: Secondary | ICD-10-CM

## 2012-04-29 LAB — POCT INR: INR: 2.3

## 2012-05-03 ENCOUNTER — Other Ambulatory Visit: Payer: Self-pay | Admitting: Internal Medicine

## 2012-05-19 ENCOUNTER — Ambulatory Visit: Payer: Medicare Other | Admitting: Internal Medicine

## 2012-05-22 ENCOUNTER — Encounter: Payer: Self-pay | Admitting: Internal Medicine

## 2012-05-22 ENCOUNTER — Ambulatory Visit (INDEPENDENT_AMBULATORY_CARE_PROVIDER_SITE_OTHER): Payer: Medicare Other | Admitting: Internal Medicine

## 2012-05-22 VITALS — BP 155/90 | HR 59 | Ht 72.0 in | Wt 212.6 lb

## 2012-05-22 DIAGNOSIS — I4891 Unspecified atrial fibrillation: Secondary | ICD-10-CM

## 2012-05-22 DIAGNOSIS — M109 Gout, unspecified: Secondary | ICD-10-CM

## 2012-05-22 NOTE — Progress Notes (Signed)
HPI [name] returns today for followup. He is a very pleasant 78 year old man with chronic atrial fibrillation and hypertension, who is been well-controlled on medical therapy. In the interim, he has developed gout, and recently experienced a flare. He was not aware that certain foods might exacerbate this problem. He denies chest pain, shortness of breath, or syncope. Where his gout has bothered him, his left lower extremity has been swollen. Allergies  Allergen Reactions  . Polysporin (Bacitracin-Polymyxin B) Other (See Comments)    Doesn't remember reaction     Current Outpatient Prescriptions  Medication Sig Dispense Refill  . amLODipine (NORVASC) 2.5 MG tablet Take 1 tablet (2.5 mg total) by mouth daily.  90 tablet  6  . Cholecalciferol (VITAMIN D) 1000 UNITS capsule Take 1,000 Units by mouth daily.        . metoprolol succinate (TOPROL-XL) 50 MG 24 hr tablet Take 50 mg by mouth daily.      . Multiple Vitamins-Minerals (ICAPS MV) TABS Take 1 tablet by mouth daily.        Marland Kitchen omeprazole (PRILOSEC) 40 MG capsule TAKE 1 CAPSULE ONCE DAILY  90 capsule  1  . polyethylene glycol powder (GLYCOLAX/MIRALAX) powder Take 17 g by mouth as needed.      . pravastatin (PRAVACHOL) 40 MG tablet TAKE 1 TABLET DAILY  90 tablet  0  . Tamsulosin HCl (FLOMAX) 0.4 MG CAPS daily.       Marland Kitchen warfarin (COUMADIN) 2.5 MG tablet TAKE AS DIRECTED BY ANTICOAGULATION CLINIC  90 tablet  3  . DISCONTD: metoprolol succinate (TOPROL-XL) 50 MG 24 hr tablet Take 1 tablet (50 mg total) by mouth 2 (two) times daily.  180 tablet  3  . DISCONTD: polyethylene glycol powder (GLYCOLAX/MIRALAX) powder Take 17 g by mouth daily.  527 g  3     Past Medical History  Diagnosis Date  . Anemia     low blood platelets  . Arthritis   . Blood transfusion   . Cancer     skin CA  . Cataract   . Heart murmur   . Hyperlipidemia   . Hypertension   . TIA (transient ischemic attack)   . Atrial fibrillation     on Coumadin  . Myocardial  infarction     after colon surgery    ROS:   All systems reviewed and negative except as noted in the HPI.   Past Surgical History  Procedure Date  . Colon surgery     partial colectomy  . Total hip arthroplasty     LEFT HIP  . Coronary artery bypass graft     3 vessels  . Heart stent   . Thoracotomy   . Lens implants      Family History  Problem Relation Age of Onset  . Colon polyps Mother   . Stroke Father   . Stroke Other      History   Social History  . Marital Status: Married    Spouse Name: John [name]    Number of Children: N/A  . Years of Education: N/A   Occupational History  .     Social History Main Topics  . Smoking status: Former Smoker    Quit date: 10/28/1965  . Smokeless tobacco: Not on file  . Alcohol Use: 0.0 oz/week  . Drug Use: Not on file  . Sexually Active: Not on file   Other Topics Concern  . Not on file   Social History Narrative  .  No narrative on file     BP 155/90  Pulse 59  Ht 6' (1.829 m)  Wt 212 lb 9.6 oz (96.435 kg)  BMI 28.83 kg/m2  Physical Exam:  Well appearing elderly man, NAD HEENT: Unremarkable Neck:  No JVD, no thyromegally Lungs:  Clear with no wheezes, rales, or rhonchi. HEART:  IRegular rate rhythm, no murmurs, no rubs, no clicks Abd:  soft, positive bowel sounds, no organomegally, no rebound, no guarding Ext:  2 plus pulses, trace edema in left lower extremity, no cyanosis, no clubbing Skin:  No rashes no nodules Neuro:  CN II through XII intact, motor grossly intact  EKG Atrial fibrillation with a controlled ventricular response  Assess/Plan:

## 2012-05-22 NOTE — Patient Instructions (Signed)
Your physician wants you to follow-up in: ONE YEAR WITH DR Court Joy will receive a reminder letter in the mail two months in advance. If you don't receive a letter, please call our office to schedule the follow-up appointment.   Gout Gout is caused by a buildup of uric acid crystals in the joints. The crystals make your joints sore. This is like having sand in your joints. Repeat attacks are common. Gout can be treated. HOME CARE   Do not take aspirin for pain.  Only take medicine as told by your doctor.  You may use cold treatments (ice) on painful joints.  Put ice in a plastic bag.  Place a towel between your skin and the bag.  Leave the ice on for 15 to 20 minutes at a time, 3 to 4 times a day.  Rest in bed as much as possible. When in bed, keep the sheets and blankets off your sore joints.  Keep the sore joints raised (elevated).  Use crutches if your legs or ankles hurt.  Drink enough water and fluids to keep your pee (urine) clear or pale yellow. This helps your body get rid of uric acid. Do not drink alcohol.  Follow diet instructions as told by your doctor.  Keep your body at a healthy weight. GET HELP RIGHT AWAY IF:   You have a temperature by mouth above 102 F (38.9 C), not controlled by medicine.  You have watery poop (diarrhea).  You are throwing up (vomiting).  You do not feel better in 1 day, or you are getting worse.  Your joint hurts more.  You have the chills. MAKE SURE YOU:   Understand these instructions.  Will watch your condition.  Will get help right away if you are not doing well or get worse. Document Released: 04/24/2008 Document Revised: 10/08/2011 Document Reviewed: 10/24/2009 [employer] Patient Information 2013 [place], [place].

## 2012-05-22 NOTE — Assessment & Plan Note (Signed)
I have given him a list of foods to avoid. For now, he does his episodes occur only once or twice a year, would not refer him for rheumatologic evaluation. If his symptoms worsen, then he would need subspecialty followup. He will use ibuprofen for control otherwise.

## 2012-05-22 NOTE — Assessment & Plan Note (Signed)
His ventricular rate appears to be well-controlled. He will continue his current medical therapy. 

## 2012-05-23 ENCOUNTER — Other Ambulatory Visit: Payer: Self-pay | Admitting: Internal Medicine

## 2012-05-26 ENCOUNTER — Telehealth: Payer: Self-pay | Admitting: Cardiology

## 2012-05-26 NOTE — Telephone Encounter (Signed)
Awaiting Dr Lubertha Basque signature

## 2012-05-26 NOTE — Telephone Encounter (Signed)
Left message for pt to call.

## 2012-05-26 NOTE — Telephone Encounter (Signed)
Pt wanted to thank you for what you sent, needs more info from you

## 2012-05-26 NOTE — Telephone Encounter (Signed)
Spoke with pt, he wants to get a Handicap plaque mailed to him. Will forward for dr taylor review

## 2012-05-27 NOTE — Telephone Encounter (Signed)
Pt aware and will come pick form up

## 2012-05-27 NOTE — Telephone Encounter (Signed)
Follow-up:     Patient called in requesting that Dr. Ladona Ridgel to sign an approval for him to receive a handicap parking plaque.  Please call back

## 2012-05-31 ENCOUNTER — Other Ambulatory Visit: Payer: Self-pay | Admitting: Internal Medicine

## 2012-06-10 ENCOUNTER — Ambulatory Visit (INDEPENDENT_AMBULATORY_CARE_PROVIDER_SITE_OTHER): Payer: Medicare Other | Admitting: *Deleted

## 2012-06-10 DIAGNOSIS — Z7901 Long term (current) use of anticoagulants: Secondary | ICD-10-CM

## 2012-06-10 DIAGNOSIS — I4891 Unspecified atrial fibrillation: Secondary | ICD-10-CM

## 2012-06-10 LAB — POCT INR: INR: 2.2

## 2012-07-21 ENCOUNTER — Ambulatory Visit (INDEPENDENT_AMBULATORY_CARE_PROVIDER_SITE_OTHER): Payer: Medicare Other | Admitting: *Deleted

## 2012-07-21 DIAGNOSIS — Z7901 Long term (current) use of anticoagulants: Secondary | ICD-10-CM

## 2012-07-21 DIAGNOSIS — I4891 Unspecified atrial fibrillation: Secondary | ICD-10-CM

## 2012-07-21 LAB — POCT INR: INR: 2.2

## 2012-08-22 ENCOUNTER — Telehealth: Payer: Self-pay | Admitting: Internal Medicine

## 2012-08-22 NOTE — Telephone Encounter (Signed)
New problem:   C/o blood pressure high in am . 218/108  @ 7 am . Noon time 118/60. On yesterday.     Today the same reading   Now  118/67. Pulse rate has been low in  40-50

## 2012-08-22 NOTE — Telephone Encounter (Signed)
Will have him increase his Amlodipine to 5mg  q hs and keep his Metoprolol at 25mg  bid and have him call me on Mon to come in for BP cuff calibration.  He is going to keep a log of his pressures

## 2012-08-27 NOTE — Telephone Encounter (Signed)
Called patient and spoke with him his BP is getting better with Amlodipine 5mg  q hs   154/96  The pain in his neck and shoulder may be a contributory factor to his BP being up in the am.  He says the pain is such that it wakes him up and he puts warm heat on it to help it subside  He is going to keep the log running of his BP's and let me know if they do not continue to get better

## 2012-09-01 ENCOUNTER — Ambulatory Visit (INDEPENDENT_AMBULATORY_CARE_PROVIDER_SITE_OTHER): Payer: Medicare Other

## 2012-09-01 DIAGNOSIS — I4891 Unspecified atrial fibrillation: Secondary | ICD-10-CM

## 2012-09-01 DIAGNOSIS — Z7901 Long term (current) use of anticoagulants: Secondary | ICD-10-CM

## 2012-10-13 ENCOUNTER — Ambulatory Visit (INDEPENDENT_AMBULATORY_CARE_PROVIDER_SITE_OTHER): Payer: Medicare Other | Admitting: *Deleted

## 2012-10-13 DIAGNOSIS — Z7901 Long term (current) use of anticoagulants: Secondary | ICD-10-CM

## 2012-10-24 ENCOUNTER — Other Ambulatory Visit: Payer: Self-pay | Admitting: Internal Medicine

## 2012-10-27 ENCOUNTER — Other Ambulatory Visit: Payer: Self-pay | Admitting: Internal Medicine

## 2012-10-28 ENCOUNTER — Encounter: Payer: Self-pay | Admitting: Internal Medicine

## 2012-10-28 ENCOUNTER — Ambulatory Visit (INDEPENDENT_AMBULATORY_CARE_PROVIDER_SITE_OTHER): Payer: Medicare Other | Admitting: Internal Medicine

## 2012-10-28 VITALS — BP 130/86 | HR 68 | Temp 98.3°F | Resp 16 | Ht 72.0 in | Wt 208.0 lb

## 2012-10-28 DIAGNOSIS — I2510 Atherosclerotic heart disease of native coronary artery without angina pectoris: Secondary | ICD-10-CM

## 2012-10-28 DIAGNOSIS — I10 Essential (primary) hypertension: Secondary | ICD-10-CM

## 2012-10-28 DIAGNOSIS — Z8679 Personal history of other diseases of the circulatory system: Secondary | ICD-10-CM

## 2012-10-28 DIAGNOSIS — N320 Bladder-neck obstruction: Secondary | ICD-10-CM

## 2012-10-28 DIAGNOSIS — Z0000 Encounter for general adult medical examination without abnormal findings: Secondary | ICD-10-CM

## 2012-10-28 DIAGNOSIS — I739 Peripheral vascular disease, unspecified: Secondary | ICD-10-CM

## 2012-10-28 MED ORDER — AMLODIPINE BESYLATE 2.5 MG PO TABS: 2.5000 mg | ORAL_TABLET | Freq: Two times a day (BID) | ORAL | Status: DC

## 2012-10-28 NOTE — Assessment & Plan Note (Signed)
BP Readings from Last 3 Encounters:  10/28/12 130/86  05/22/12 155/90  04/25/12 112/70  Continue with current prescription therapy as reflected on the Med list.

## 2012-10-28 NOTE — Assessment & Plan Note (Signed)
Continue with current prescription therapy as reflected on the Med list.  

## 2012-10-28 NOTE — Assessment & Plan Note (Signed)

## 2012-10-28 NOTE — Assessment & Plan Note (Signed)
Most recent 9/12 Most recent 9/12 x30 years (2 per year) Sounds more like migraine aura w/o HA attacks than TIA

## 2012-10-28 NOTE — Progress Notes (Signed)
  Subjective:    HPI   The patient is here for a wellness exam. The patient has been doing well overall without major physical or psychological issues going on lately. The patient presents for a follow-up of  chronic hypertension, CAD, chronic dyslipidemia, TIAs/auras controlled with medicines - no relapse lately (2 per year)      Review of Systems  Constitutional: Negative for appetite change, fatigue and unexpected weight change.  HENT: Negative for nosebleeds, congestion, sore throat, sneezing, trouble swallowing and neck pain.   Eyes: Negative for itching and visual disturbance.  Respiratory: Negative for cough.   Cardiovascular: Negative for chest pain, palpitations and leg swelling.  Gastrointestinal: Negative for nausea, diarrhea, blood in stool and abdominal distention.  Genitourinary: Negative for frequency and hematuria.  Musculoskeletal: Negative for back pain, joint swelling and gait problem.  Skin: Negative for rash.  Neurological: Negative for dizziness, tremors, speech difficulty and weakness.  Psychiatric/Behavioral: Negative for sleep disturbance, dysphoric mood and agitation. The patient is not nervous/anxious.    Wt Readings from Last 3 Encounters:  10/28/12 208 lb (94.348 kg)  05/22/12 212 lb 9.6 oz (96.435 kg)  04/25/12 208 lb (94.348 kg)   BP Readings from Last 3 Encounters:  10/28/12 130/86  05/22/12 155/90  04/25/12 112/70        Objective:   Physical Exam  Constitutional: He is oriented to person, place, and time. He appears well-developed.  HENT:  Mouth/Throat: Oropharynx is clear and moist.  Eyes: Conjunctivae are normal. Pupils are equal, round, and reactive to light.  Neck: Normal range of motion. No JVD present. No thyromegaly present.  Cardiovascular: Normal rate, normal heart sounds and intact distal pulses.  Exam reveals no gallop and no friction rub.   No murmur heard. irreg  Pulmonary/Chest: Effort normal and breath sounds normal. No  respiratory distress. He has no wheezes. He has no rales. He exhibits no tenderness.  Abdominal: Soft. Bowel sounds are normal. He exhibits no distension and no mass. There is no tenderness. There is no rebound and no guarding.  Genitourinary: Rectum normal and prostate normal. Guaiac negative stool.  Musculoskeletal: Normal range of motion. He exhibits edema (trace). He exhibits no tenderness.  Lymphadenopathy:    He has no cervical adenopathy.  Neurological: He is alert and oriented to person, place, and time. He has normal reflexes. No cranial nerve deficit. He exhibits normal muscle tone. Coordination normal.  Skin: Skin is warm and dry. No rash noted.  Psychiatric: He has a normal mood and affect. His behavior is normal. Judgment and thought content normal.    Lab Results  Component Value Date   WBC 10.4 04/25/2012   HGB 15.3 04/25/2012   HCT 46.7 04/25/2012   PLT 124.0* 04/25/2012   CHOL 152 11/01/2011   TRIG 115.0 11/01/2011   HDL 33.70* 11/01/2011   ALT 14 11/01/2011   AST 20 11/01/2011   NA 141 04/25/2012   K 5.3* 04/25/2012   CL 108 04/25/2012   CREATININE 1.6* 04/25/2012   BUN 24* 04/25/2012   CO2 25 04/25/2012   TSH 1.52 07/19/2010   PSA 1.25 07/19/2010   INR 2.5 10/13/2012   HGBA1C 6.1 04/23/2011         Assessment & Plan:

## 2012-10-31 ENCOUNTER — Other Ambulatory Visit (INDEPENDENT_AMBULATORY_CARE_PROVIDER_SITE_OTHER): Payer: Medicare Other

## 2012-10-31 DIAGNOSIS — N320 Bladder-neck obstruction: Secondary | ICD-10-CM

## 2012-10-31 DIAGNOSIS — I739 Peripheral vascular disease, unspecified: Secondary | ICD-10-CM

## 2012-10-31 DIAGNOSIS — Z0000 Encounter for general adult medical examination without abnormal findings: Secondary | ICD-10-CM

## 2012-10-31 DIAGNOSIS — Z8679 Personal history of other diseases of the circulatory system: Secondary | ICD-10-CM

## 2012-10-31 DIAGNOSIS — I10 Essential (primary) hypertension: Secondary | ICD-10-CM

## 2012-10-31 DIAGNOSIS — I2510 Atherosclerotic heart disease of native coronary artery without angina pectoris: Secondary | ICD-10-CM

## 2012-10-31 LAB — LIPID PANEL
HDL: 26 mg/dL — ABNORMAL LOW (ref >39.00)
LDL Cholesterol: 89 mg/dL (ref 0–99)
Total CHOL/HDL Ratio: 5
Triglycerides: 107 mg/dL (ref 0.0–149.0)
VLDL: 21.4 mg/dL (ref 0.0–40.0)

## 2012-10-31 LAB — URINALYSIS
Bilirubin Urine: NEGATIVE
Ketones, ur: NEGATIVE
Leukocytes, UA: NEGATIVE
Nitrite: NEGATIVE
Urobilinogen, UA: 1 (ref 0.0–1.0)

## 2012-10-31 LAB — CBC WITH DIFFERENTIAL/PLATELET
Eosinophils Relative: 5.5 % — ABNORMAL HIGH (ref 0.0–5.0)
HCT: 45.3 % (ref 39.0–52.0)
Hemoglobin: 14.9 g/dL (ref 13.0–17.0)
Lymphs Abs: 1.6 10*3/uL (ref 0.7–4.0)
MCV: 88.8 fl (ref 78.0–100.0)
Monocytes Absolute: 0.6 10*3/uL (ref 0.1–1.0)
Monocytes Relative: 7.6 % (ref 3.0–12.0)
Neutro Abs: 5.7 10*3/uL (ref 1.4–7.7)
Platelets: 127 10*3/uL — ABNORMAL LOW (ref 150.0–400.0)
WBC: 8.5 10*3/uL (ref 4.5–10.5)

## 2012-10-31 LAB — BASIC METABOLIC PANEL
BUN: 19 mg/dL (ref 6–23)
Chloride: 103 mEq/L (ref 96–112)
GFR: 64.57 mL/min (ref >60.00)
Glucose, Bld: 98 mg/dL (ref 70–99)
Potassium: 4.7 mEq/L (ref 3.5–5.1)
Sodium: 135 mEq/L (ref 135–145)

## 2012-10-31 LAB — HEPATIC FUNCTION PANEL
Albumin: 4 g/dL (ref 3.5–5.2)
Alkaline Phosphatase: 70 U/L (ref 39–117)
Total Bilirubin: 1.1 mg/dL (ref 0.3–1.2)

## 2012-10-31 LAB — TSH: TSH: 1.79 u[IU]/mL (ref 0.35–5.50)

## 2012-11-01 ENCOUNTER — Telehealth: Payer: Self-pay | Admitting: Internal Medicine

## 2012-11-01 DIAGNOSIS — E875 Hyperkalemia: Secondary | ICD-10-CM

## 2012-11-01 NOTE — Telephone Encounter (Signed)
John [name], please, inform patient that all labs are normal except for elev potass and kidney test. Pls drink more water and re-check BMET in1-2 wks Thx

## 2012-11-03 NOTE — Telephone Encounter (Signed)
Pt informed

## 2012-11-24 ENCOUNTER — Ambulatory Visit (INDEPENDENT_AMBULATORY_CARE_PROVIDER_SITE_OTHER): Payer: Medicare Other | Admitting: Pharmacist

## 2012-11-24 DIAGNOSIS — Z7901 Long term (current) use of anticoagulants: Secondary | ICD-10-CM

## 2012-11-24 DIAGNOSIS — I4891 Unspecified atrial fibrillation: Secondary | ICD-10-CM

## 2012-12-17 ENCOUNTER — Other Ambulatory Visit (INDEPENDENT_AMBULATORY_CARE_PROVIDER_SITE_OTHER): Payer: Medicare Other

## 2012-12-17 DIAGNOSIS — E875 Hyperkalemia: Secondary | ICD-10-CM

## 2012-12-17 LAB — BASIC METABOLIC PANEL
BUN: 18 mg/dL (ref 6–23)
Chloride: 104 mEq/L (ref 96–112)
Potassium: 4.8 mEq/L (ref 3.5–5.1)

## 2012-12-23 ENCOUNTER — Ambulatory Visit (INDEPENDENT_AMBULATORY_CARE_PROVIDER_SITE_OTHER): Payer: Medicare Other | Admitting: *Deleted

## 2012-12-23 DIAGNOSIS — I4891 Unspecified atrial fibrillation: Secondary | ICD-10-CM

## 2012-12-23 DIAGNOSIS — Z7901 Long term (current) use of anticoagulants: Secondary | ICD-10-CM

## 2012-12-23 LAB — POCT INR: INR: 2.2

## 2013-01-20 ENCOUNTER — Ambulatory Visit (INDEPENDENT_AMBULATORY_CARE_PROVIDER_SITE_OTHER): Payer: Medicare Other | Admitting: *Deleted

## 2013-01-20 DIAGNOSIS — Z7901 Long term (current) use of anticoagulants: Secondary | ICD-10-CM

## 2013-01-20 DIAGNOSIS — I4891 Unspecified atrial fibrillation: Secondary | ICD-10-CM

## 2013-01-20 LAB — POCT INR: INR: 2.7

## 2013-02-02 ENCOUNTER — Other Ambulatory Visit: Payer: Self-pay | Admitting: Internal Medicine

## 2013-02-05 ENCOUNTER — Telehealth: Payer: Self-pay | Admitting: *Deleted

## 2013-02-05 NOTE — Telephone Encounter (Signed)
Pt called requesting a copy of his most recent lab results.

## 2013-02-06 NOTE — Telephone Encounter (Signed)
Ok Thx 

## 2013-02-24 ENCOUNTER — Ambulatory Visit (INDEPENDENT_AMBULATORY_CARE_PROVIDER_SITE_OTHER): Payer: Medicare Other | Admitting: *Deleted

## 2013-02-24 DIAGNOSIS — I4891 Unspecified atrial fibrillation: Secondary | ICD-10-CM

## 2013-02-24 DIAGNOSIS — Z7901 Long term (current) use of anticoagulants: Secondary | ICD-10-CM

## 2013-03-17 ENCOUNTER — Other Ambulatory Visit: Payer: Self-pay | Admitting: Internal Medicine

## 2013-04-07 ENCOUNTER — Ambulatory Visit (INDEPENDENT_AMBULATORY_CARE_PROVIDER_SITE_OTHER): Payer: Medicare Other | Admitting: *Deleted

## 2013-04-07 DIAGNOSIS — Z7901 Long term (current) use of anticoagulants: Secondary | ICD-10-CM

## 2013-04-07 DIAGNOSIS — I4891 Unspecified atrial fibrillation: Secondary | ICD-10-CM

## 2013-04-07 LAB — POCT INR: INR: 2.2

## 2013-04-17 ENCOUNTER — Other Ambulatory Visit: Payer: Self-pay | Admitting: Internal Medicine

## 2013-05-05 ENCOUNTER — Encounter: Payer: Self-pay | Admitting: Internal Medicine

## 2013-05-05 ENCOUNTER — Ambulatory Visit (INDEPENDENT_AMBULATORY_CARE_PROVIDER_SITE_OTHER): Payer: Medicare Other | Admitting: Internal Medicine

## 2013-05-05 ENCOUNTER — Other Ambulatory Visit (INDEPENDENT_AMBULATORY_CARE_PROVIDER_SITE_OTHER): Payer: Medicare Other

## 2013-05-05 VITALS — BP 148/90 | HR 84 | Temp 97.8°F | Resp 12 | Wt 208.0 lb

## 2013-05-05 DIAGNOSIS — Z23 Encounter for immunization: Secondary | ICD-10-CM

## 2013-05-05 DIAGNOSIS — I10 Essential (primary) hypertension: Secondary | ICD-10-CM

## 2013-05-05 DIAGNOSIS — K4090 Unilateral inguinal hernia, without obstruction or gangrene, not specified as recurrent: Secondary | ICD-10-CM | POA: Insufficient documentation

## 2013-05-05 DIAGNOSIS — E785 Hyperlipidemia, unspecified: Secondary | ICD-10-CM

## 2013-05-05 LAB — BASIC METABOLIC PANEL
Chloride: 107 mEq/L (ref 96–112)
Glucose, Bld: 107 mg/dL — ABNORMAL HIGH (ref 70–99)
Potassium: 4.2 mEq/L (ref 3.5–5.1)

## 2013-05-05 LAB — LIPID PANEL
Cholesterol: 132 mg/dL (ref 0–200)
LDL Cholesterol: 81 mg/dL (ref 0–99)
Triglycerides: 91 mg/dL (ref 0.0–149.0)
VLDL: 18.2 mg/dL (ref 0.0–40.0)

## 2013-05-05 LAB — HEPATIC FUNCTION PANEL
AST: 18 U/L (ref 0–37)
Albumin: 3.9 g/dL (ref 3.5–5.2)
Total Protein: 7.4 g/dL (ref 6.0–8.3)

## 2013-05-05 NOTE — Assessment & Plan Note (Signed)
Continue with current prescription therapy as reflected on the Med list.  

## 2013-05-05 NOTE — Assessment & Plan Note (Signed)
Surg ref Dr Rosenbauer 

## 2013-05-05 NOTE — Progress Notes (Signed)
   Subjective:    HPI    The patient presents for a follow-up of  chronic hypertension, CAD, chronic dyslipidemia, TIAs/auras controlled with medicines - no relapse lately (2 per year).    Review of Systems  Constitutional: Negative for appetite change, fatigue and unexpected weight change.  HENT: Negative for nosebleeds, congestion, sore throat, sneezing, trouble swallowing and neck pain.   Eyes: Negative for itching and visual disturbance.  Respiratory: Negative for cough.   Cardiovascular: Negative for chest pain, palpitations and leg swelling.  Gastrointestinal: Negative for nausea, diarrhea, blood in stool and abdominal distention.  Genitourinary: Negative for frequency and hematuria.  Musculoskeletal: Negative for back pain, joint swelling and gait problem.  Skin: Negative for rash.  Neurological: Negative for dizziness, tremors, speech difficulty and weakness.  Psychiatric/Behavioral: Negative for sleep disturbance, dysphoric mood and agitation. The patient is not nervous/anxious.    Wt Readings from Last 3 Encounters:  05/05/13 208 lb (94.348 kg)  10/28/12 208 lb (94.348 kg)  05/22/12 212 lb 9.6 oz (96.435 kg)   BP Readings from Last 3 Encounters:  05/05/13 148/90  10/28/12 130/86  05/22/12 155/90        Objective:   Physical Exam  Constitutional: He is oriented to person, place, and time. He appears well-developed.  HENT:  Mouth/Throat: Oropharynx is clear and moist.  Eyes: Conjunctivae are normal. Pupils are equal, round, and reactive to light.  Neck: Normal range of motion. No JVD present. No thyromegaly present.  Cardiovascular: Normal rate, normal heart sounds and intact distal pulses.  Exam reveals no gallop and no friction rub.   No murmur heard. irreg  Pulmonary/Chest: Effort normal and breath sounds normal. No respiratory distress. He has no wheezes. He has no rales. He exhibits no tenderness.  Abdominal: Soft. Bowel sounds are normal. He exhibits no  distension and no mass. There is no tenderness. There is no rebound and no guarding.  Genitourinary: Rectum normal and prostate normal. Guaiac negative stool.  Musculoskeletal: Normal range of motion. He exhibits edema (trace). He exhibits no tenderness.  Lymphadenopathy:    He has no cervical adenopathy.  Neurological: He is alert and oriented to person, place, and time. He has normal reflexes. No cranial nerve deficit. He exhibits normal muscle tone. Coordination normal.  Skin: Skin is warm and dry. No rash noted.  Psychiatric: He has a normal mood and affect. His behavior is normal. Judgment and thought content normal.  Large L inguinal mass - NT  Lab Results  Component Value Date   WBC 8.5 10/31/2012   HGB 14.9 10/31/2012   HCT 45.3 10/31/2012   PLT 127.0* 10/31/2012   CHOL 136 10/31/2012   TRIG 107.0 10/31/2012   HDL 26.00* 10/31/2012   ALT 15 10/31/2012   AST 21 10/31/2012   NA 138 12/17/2012   K 4.8 12/17/2012   CL 104 12/17/2012   CREATININE 1.4 12/17/2012   BUN 18 12/17/2012   CO2 27 12/17/2012   TSH 1.79 10/31/2012   PSA 1.24 10/31/2012   INR 2.2 04/07/2013   HGBA1C 6.1 04/23/2011         Assessment & Plan:

## 2013-05-10 ENCOUNTER — Encounter: Payer: Self-pay | Admitting: Internal Medicine

## 2013-05-12 ENCOUNTER — Ambulatory Visit (INDEPENDENT_AMBULATORY_CARE_PROVIDER_SITE_OTHER): Payer: BC Managed Care – PPO | Admitting: Surgery

## 2013-05-14 ENCOUNTER — Ambulatory Visit (INDEPENDENT_AMBULATORY_CARE_PROVIDER_SITE_OTHER): Payer: Medicare Other | Admitting: Internal Medicine

## 2013-05-14 ENCOUNTER — Encounter: Payer: Self-pay | Admitting: Internal Medicine

## 2013-05-14 VITALS — BP 134/77 | HR 70 | Ht 72.0 in | Wt 206.4 lb

## 2013-05-14 DIAGNOSIS — I4891 Unspecified atrial fibrillation: Secondary | ICD-10-CM

## 2013-05-14 DIAGNOSIS — Z01810 Encounter for preprocedural cardiovascular examination: Secondary | ICD-10-CM

## 2013-05-14 DIAGNOSIS — Z7901 Long term (current) use of anticoagulants: Secondary | ICD-10-CM

## 2013-05-14 NOTE — Patient Instructions (Signed)
Your physician wants you to follow-up in: ONE YEAR WITH DR TAYLOR You will receive a reminder letter in the mail two months in advance. If you don't receive a letter, please call our office to schedule the follow-up appointment.  

## 2013-05-17 ENCOUNTER — Encounter: Payer: Self-pay | Admitting: Internal Medicine

## 2013-05-17 DIAGNOSIS — Z01810 Encounter for preprocedural cardiovascular examination: Secondary | ICD-10-CM | POA: Insufficient documentation

## 2013-05-17 NOTE — Assessment & Plan Note (Signed)
The patient has an abdominal hernia, and is considering surgical repair. He does have a history of atrial fibrillation and a TIA. He does not have angina but does have advanced age and is fairly sedentary. He has known coronary disease. If the decision is made to proceed with hernia repair, and because we cannot assess his clinical status because of his sedentary lifestyle, I would recommend preop stress testing. Because of his history of a TIA, he will require overlap of his warfarin with Lovenox.

## 2013-05-17 NOTE — Assessment & Plan Note (Signed)
His ventricular rate is well controlled with atrial fibrillation. No change in medical therapy.

## 2013-05-17 NOTE — Progress Notes (Signed)
HPI John [name] returns today for followup. He is a very pleasant 79 year old man with chronic atrial fibrillation and hypertension, who is been well-controlled on medical therapy. In the interim, he has developed an abdominal hernia, and is considering having it repaired. He denies chest pain, shortness of breath, or syncope. He is fairly sedentary. Allergies  Allergen Reactions  . Polysporin [Bacitracin-Polymyxin B] Other (See Comments)    Doesn't remember reaction     Current Outpatient Prescriptions  Medication Sig Dispense Refill  . amLODipine (NORVASC) 2.5 MG tablet Take 1 tablet (2.5 mg total) by mouth 2 (two) times daily.  180 tablet  3  . Cholecalciferol (VITAMIN D) 1000 UNITS capsule Take 1,000 Units by mouth daily.        . metoprolol succinate (TOPROL-XL) 50 MG 24 hr tablet Take 50 mg by mouth daily.      . metoprolol succinate (TOPROL-XL) 50 MG 24 hr tablet TAKE 1 TABLET TWICE A DAY  60 tablet  1  . Multiple Vitamins-Minerals (ICAPS MV) TABS Take 1 tablet by mouth daily.        Marland Kitchen omeprazole (PRILOSEC) 40 MG capsule TAKE 1 CAPSULE DAILY  90 capsule  1  . polyethylene glycol powder (GLYCOLAX/MIRALAX) powder Take 17 g by mouth as needed.      . pravastatin (PRAVACHOL) 40 MG tablet TAKE 1 TABLET DAILY  90 tablet  3  . Tamsulosin HCl (FLOMAX) 0.4 MG CAPS daily.       Marland Kitchen warfarin (COUMADIN) 2.5 MG tablet TAKE AS DIRECTED BY ANTICOAGULATION CLINIC  90 tablet  1   No current facility-administered medications for this visit.     Past Medical History  Diagnosis Date  . Anemia     low blood platelets  . Arthritis   . Blood transfusion   . Cancer     skin CA  . Cataract   . Heart murmur   . Hyperlipidemia   . Hypertension   . TIA (transient ischemic attack)   . Atrial fibrillation     on Coumadin  . Myocardial infarction     after colon surgery    ROS:   All systems reviewed and negative except as noted in the HPI.   Past Surgical History  Procedure Laterality Date   . Colon surgery      partial colectomy  . Total hip arthroplasty      LEFT HIP  . Coronary artery bypass graft      3 vessels  . Heart stent    . Thoracotomy    . Lens implants    . Colon surgery      right     Family History  Problem Relation Age of Onset  . Colon polyps Mother   . Stroke Father   . Stroke Other      History   Social History  . Marital Status: Married    Spouse Name: Opal Kato    Number of Children: N/A  . Years of Education: N/A   Occupational History  .     Social History Main Topics  . Smoking status: Former Smoker    Quit date: 10/28/1965  . Smokeless tobacco: Not on file  . Alcohol Use: 0.0 oz/week  . Drug Use: Not on file  . Sexual Activity: Yes   Other Topics Concern  . Not on file   Social History Narrative  . No narrative on file     BP 134/77  Pulse 70  Ht 6' (1.829  m)  Wt 206 lb 6.4 oz (93.622 kg)  BMI 27.99 kg/m2  Physical Exam:  Well appearing elderly man, NAD HEENT: Unremarkable Neck:  6 cm JVD, no thyromegally Lungs:  Clear with no wheezes, rales, or rhonchi. HEART:  IRegular rate rhythm, no murmurs, no rubs, no clicks Abd:  soft, positive bowel sounds, no organomegally, no rebound, no guarding Ext:  2 plus pulses, trace edema in left lower extremity, no cyanosis, no clubbing Skin:  No rashes no nodules Neuro:  CN II through XII intact, motor grossly intact  EKG Atrial fibrillation with a controlled ventricular response  Assess/Plan:

## 2013-05-19 ENCOUNTER — Ambulatory Visit (INDEPENDENT_AMBULATORY_CARE_PROVIDER_SITE_OTHER): Payer: Medicare Other | Admitting: Pharmacist

## 2013-05-19 ENCOUNTER — Ambulatory Visit (INDEPENDENT_AMBULATORY_CARE_PROVIDER_SITE_OTHER): Payer: Medicare Other | Admitting: General Surgery

## 2013-05-19 ENCOUNTER — Encounter (INDEPENDENT_AMBULATORY_CARE_PROVIDER_SITE_OTHER): Payer: Self-pay | Admitting: General Surgery

## 2013-05-19 VITALS — BP 130/80 | HR 72 | Temp 98.0°F | Resp 18 | Ht 71.0 in | Wt 208.0 lb

## 2013-05-19 DIAGNOSIS — K4090 Unilateral inguinal hernia, without obstruction or gangrene, not specified as recurrent: Secondary | ICD-10-CM

## 2013-05-19 DIAGNOSIS — Z7901 Long term (current) use of anticoagulants: Secondary | ICD-10-CM

## 2013-05-19 DIAGNOSIS — I4891 Unspecified atrial fibrillation: Secondary | ICD-10-CM

## 2013-05-19 NOTE — Progress Notes (Signed)
Patient ID: John Boozer., male   DOB: 05-08-1924, 79 y.o.   MRN: [id number]  Chief Complaint  Patient presents with  . Establish Care    hernia    HPI John [name]. is a 79 y.o. male.   HPI  He is referred by Dr. Posey Rea for further evaluation and possible treatment of a left inguinal hernia. He has noticed a bulge in the left groin for some time. It is not painful. He does not interfere with his bowel or bladder habits. He states it is always out and does not go back in.  Dr. Posey Rea saw him and felt a hernia and sent him over here.  He takes care of his wife at home and sometimes has to lift her.  Past Medical History  Diagnosis Date  . Anemia     low blood platelets  . Arthritis   . Blood transfusion   . Cancer     skin CA  . Cataract   . Heart murmur   . Hyperlipidemia   . Hypertension   . TIA (transient ischemic attack)   . Atrial fibrillation     on Coumadin  . Myocardial infarction     after colon surgery    Past Surgical History  Procedure Laterality Date  . Colon surgery      partial colectomy  . Total hip arthroplasty      LEFT HIP  . Coronary artery bypass graft      3 vessels  . Heart stent    . Thoracotomy    . Lens implants    . Colon surgery      right    Family History  Problem Relation Age of Onset  . Colon polyps Mother   . Stroke Father   . Stroke Other     Social History History  Substance Use Topics  . Smoking status: Former Smoker    Quit date: 10/28/1965  . Smokeless tobacco: Not on file  . Alcohol Use: 0.0 oz/week    Allergies  Allergen Reactions  . Polysporin [Bacitracin-Polymyxin B] Other (See Comments)    Doesn't remember reaction    Current Outpatient Prescriptions  Medication Sig Dispense Refill  . amLODipine (NORVASC) 2.5 MG tablet Take 1 tablet (2.5 mg total) by mouth 2 (two) times daily.  180 tablet  3  . Cholecalciferol (VITAMIN D) 1000 UNITS capsule Take 1,000 Units by mouth daily.        .  metoprolol succinate (TOPROL-XL) 50 MG 24 hr tablet Take 50 mg by mouth daily.      . metoprolol succinate (TOPROL-XL) 50 MG 24 hr tablet TAKE 1 TABLET TWICE A DAY  60 tablet  1  . Multiple Vitamins-Minerals (ICAPS MV) TABS Take 1 tablet by mouth daily.        Marland Kitchen omeprazole (PRILOSEC) 40 MG capsule TAKE 1 CAPSULE DAILY  90 capsule  1  . polyethylene glycol powder (GLYCOLAX/MIRALAX) powder Take 17 g by mouth as needed.      . pravastatin (PRAVACHOL) 40 MG tablet TAKE 1 TABLET DAILY  90 tablet  3  . Tamsulosin HCl (FLOMAX) 0.4 MG CAPS daily.       Marland Kitchen warfarin (COUMADIN) 2.5 MG tablet TAKE AS DIRECTED BY ANTICOAGULATION CLINIC  90 tablet  1   No current facility-administered medications for this visit.    Review of Systems Review of Systems  Gastrointestinal: Negative for abdominal pain.  Genitourinary: Negative for testicular pain.  Blood pressure 130/80, pulse 72, temperature 98 F (36.7 C), resp. rate 18, height 5\' 11"  (1.803 m), weight 208 lb (94.348 kg).  Physical Exam Physical Exam  Constitutional: He appears well-nourished. No distress.  Elderly male.  Cardiovascular: Normal rate.   Irregular rhythm  Pulmonary/Chest: Effort normal and breath sounds normal.  midsternal scar  Abdominal: Soft. He exhibits no mass. There is no tenderness.  No umbilical hernia. Lower midline scar.  Genitourinary:  Moderate to large left inguinal bulge that is not reducible. It is nontender. No right inguinal bulge. No testicular masses.    Data Reviewed Dr. Loren Racer note.  Assessment    Chronically incarcerated left inguinal hernia. We discussed options of repair or expected management. He states he really doesn't want to have any operations at this time.     Plan    I explained to him the symptoms of strangulation. I told him if the hernia becomes painful or gets larger I recommend he reconsider repair.        John [name] J 05/19/2013, 3:41 PM

## 2013-05-19 NOTE — Patient Instructions (Signed)
Please call if the hernia becomes painful or gets significantly larger. Please try not to lift anything heavy than 20 pounds if you can avoid it.

## 2013-06-30 ENCOUNTER — Ambulatory Visit (INDEPENDENT_AMBULATORY_CARE_PROVIDER_SITE_OTHER): Payer: Medicare Other | Admitting: Pharmacist

## 2013-06-30 DIAGNOSIS — Z7901 Long term (current) use of anticoagulants: Secondary | ICD-10-CM

## 2013-06-30 DIAGNOSIS — I4891 Unspecified atrial fibrillation: Secondary | ICD-10-CM

## 2013-06-30 LAB — POCT INR: INR: 2

## 2013-07-04 ENCOUNTER — Other Ambulatory Visit: Payer: Self-pay | Admitting: Internal Medicine

## 2013-07-09 ENCOUNTER — Other Ambulatory Visit: Payer: Self-pay | Admitting: Internal Medicine

## 2013-07-27 ENCOUNTER — Other Ambulatory Visit: Payer: Self-pay | Admitting: Internal Medicine

## 2013-08-11 ENCOUNTER — Ambulatory Visit (INDEPENDENT_AMBULATORY_CARE_PROVIDER_SITE_OTHER): Payer: Medicare Other

## 2013-08-11 DIAGNOSIS — I4891 Unspecified atrial fibrillation: Secondary | ICD-10-CM

## 2013-08-11 DIAGNOSIS — Z7901 Long term (current) use of anticoagulants: Secondary | ICD-10-CM

## 2013-08-11 LAB — POCT INR: INR: 2.1

## 2013-09-23 ENCOUNTER — Ambulatory Visit (INDEPENDENT_AMBULATORY_CARE_PROVIDER_SITE_OTHER): Payer: Medicare Other | Admitting: *Deleted

## 2013-09-23 DIAGNOSIS — Z5181 Encounter for therapeutic drug level monitoring: Secondary | ICD-10-CM

## 2013-09-23 DIAGNOSIS — Z7901 Long term (current) use of anticoagulants: Secondary | ICD-10-CM

## 2013-09-23 DIAGNOSIS — I4891 Unspecified atrial fibrillation: Secondary | ICD-10-CM

## 2013-09-23 LAB — POCT INR: INR: 1.9

## 2013-09-24 ENCOUNTER — Other Ambulatory Visit: Payer: Self-pay | Admitting: Internal Medicine

## 2013-10-09 ENCOUNTER — Telehealth: Payer: Self-pay | Admitting: *Deleted

## 2013-10-09 MED ORDER — AMLODIPINE BESYLATE 2.5 MG PO TABS: 2.5000 mg | ORAL_TABLET | Freq: Two times a day (BID) | ORAL | Status: DC

## 2013-10-09 NOTE — Telephone Encounter (Signed)
Patient phoned requesting refills on his norvasc.  Refilled per protocol.

## 2013-10-14 ENCOUNTER — Ambulatory Visit (INDEPENDENT_AMBULATORY_CARE_PROVIDER_SITE_OTHER): Payer: Medicare Other | Admitting: Pharmacist

## 2013-10-14 DIAGNOSIS — I4891 Unspecified atrial fibrillation: Secondary | ICD-10-CM

## 2013-10-14 DIAGNOSIS — Z5181 Encounter for therapeutic drug level monitoring: Secondary | ICD-10-CM

## 2013-10-14 DIAGNOSIS — Z7901 Long term (current) use of anticoagulants: Secondary | ICD-10-CM

## 2013-10-14 LAB — POCT INR: INR: 1.7

## 2013-11-03 ENCOUNTER — Other Ambulatory Visit (INDEPENDENT_AMBULATORY_CARE_PROVIDER_SITE_OTHER): Payer: Medicare Other

## 2013-11-03 ENCOUNTER — Encounter: Payer: Self-pay | Admitting: Internal Medicine

## 2013-11-03 ENCOUNTER — Ambulatory Visit (INDEPENDENT_AMBULATORY_CARE_PROVIDER_SITE_OTHER): Payer: Medicare Other | Admitting: Internal Medicine

## 2013-11-03 VITALS — BP 130/86 | HR 76 | Temp 98.1°F | Resp 16 | Wt 210.0 lb

## 2013-11-03 DIAGNOSIS — M109 Gout, unspecified: Secondary | ICD-10-CM

## 2013-11-03 DIAGNOSIS — D509 Iron deficiency anemia, unspecified: Secondary | ICD-10-CM

## 2013-11-03 DIAGNOSIS — Z23 Encounter for immunization: Secondary | ICD-10-CM

## 2013-11-03 DIAGNOSIS — R609 Edema, unspecified: Secondary | ICD-10-CM

## 2013-11-03 DIAGNOSIS — I10 Essential (primary) hypertension: Secondary | ICD-10-CM

## 2013-11-03 DIAGNOSIS — I2510 Atherosclerotic heart disease of native coronary artery without angina pectoris: Secondary | ICD-10-CM

## 2013-11-03 LAB — BASIC METABOLIC PANEL
BUN: 17 mg/dL (ref 6–23)
CO2: 27 meq/L (ref 19–32)
CREATININE: 1.1 mg/dL (ref 0.4–1.5)
Calcium: 9.5 mg/dL (ref 8.4–10.5)
Chloride: 105 mEq/L (ref 96–112)
GFR: 67.84 mL/min (ref >60.00)
Glucose, Bld: 118 mg/dL — ABNORMAL HIGH (ref 70–99)
POTASSIUM: 4.3 meq/L (ref 3.5–5.1)
Sodium: 139 mEq/L (ref 135–145)

## 2013-11-03 LAB — HEPATIC FUNCTION PANEL
ALBUMIN: 3.8 g/dL (ref 3.5–5.2)
ALK PHOS: 80 U/L (ref 39–117)
ALT: 15 U/L (ref 0–53)
AST: 19 U/L (ref 0–37)
BILIRUBIN DIRECT: 0.2 mg/dL (ref 0.0–0.3)
TOTAL PROTEIN: 7.6 g/dL (ref 6.0–8.3)
Total Bilirubin: 1.1 mg/dL (ref 0.3–1.2)

## 2013-11-03 LAB — TSH: TSH: 1.92 u[IU]/mL (ref 0.35–5.50)

## 2013-11-03 MED ORDER — FUROSEMIDE 20 MG PO TABS: 20.0000 mg | ORAL_TABLET | Freq: Every day | ORAL | Status: DC | PRN

## 2013-11-03 NOTE — Assessment & Plan Note (Signed)
Labs

## 2013-11-03 NOTE — Assessment & Plan Note (Signed)
except

## 2013-11-03 NOTE — Assessment & Plan Note (Signed)
Continue with current prescription therapy as reflected on the Med list.  

## 2013-11-03 NOTE — Assessment & Plan Note (Signed)
B LE - venous insufficiency L>R Furosemide prn

## 2013-11-03 NOTE — Progress Notes (Signed)
Pre visit review using our clinic review tool, if applicable. No additional management support is needed unless otherwise documented below in the visit note. 

## 2013-11-03 NOTE — Assessment & Plan Note (Signed)
Doing well 

## 2013-11-03 NOTE — Progress Notes (Signed)
   Subjective:    HPI   The patient presents for a follow-up of  chronic hypertension, CAD, chronic dyslipidemia, TIAs/auras controlled with medicines - no relapse lately. Opal died.      Review of Systems  Constitutional: Negative for appetite change, fatigue and unexpected weight change.  HENT: Negative for congestion, nosebleeds, sneezing, sore throat and trouble swallowing.   Eyes: Negative for itching and visual disturbance.  Respiratory: Negative for cough.   Cardiovascular: Negative for chest pain, palpitations and leg swelling.  Gastrointestinal: Negative for nausea, diarrhea, blood in stool and abdominal distention.  Genitourinary: Negative for frequency and hematuria.  Musculoskeletal: Negative for back pain, gait problem, joint swelling and neck pain.  Skin: Negative for rash.  Neurological: Negative for dizziness, tremors, speech difficulty and weakness.  Psychiatric/Behavioral: Negative for sleep disturbance, dysphoric mood and agitation. The patient is not nervous/anxious.    Wt Readings from Last 3 Encounters:  11/03/13 210 lb (95.255 kg)  05/19/13 208 lb (94.348 kg)  05/14/13 206 lb 6.4 oz (93.622 kg)   BP Readings from Last 3 Encounters:  11/03/13 130/86  05/19/13 130/80  05/14/13 134/77        Objective:   Physical Exam  Constitutional: He is oriented to person, place, and time. He appears well-developed.  HENT:  Mouth/Throat: Oropharynx is clear and moist.  Eyes: Conjunctivae are normal. Pupils are equal, round, and reactive to light.  Neck: Normal range of motion. No JVD present. No thyromegaly present.  Cardiovascular: Normal rate, normal heart sounds and intact distal pulses.  Exam reveals no gallop and no friction rub.   No murmur heard. irreg  Pulmonary/Chest: Effort normal and breath sounds normal. No respiratory distress. He has no wheezes. He has no rales. He exhibits no tenderness.  Abdominal: Soft. Bowel sounds are normal. He exhibits no  distension and no mass. There is no tenderness. There is no rebound and no guarding.  Genitourinary: Rectum normal and prostate normal. Guaiac negative stool.  Musculoskeletal: Normal range of motion. He exhibits edema (trace). He exhibits no tenderness.  Lymphadenopathy:    He has no cervical adenopathy.  Neurological: He is alert and oriented to person, place, and time. He has normal reflexes. No cranial nerve deficit. He exhibits normal muscle tone. Coordination normal.  Skin: Skin is warm and dry. No rash noted.  Psychiatric: He has a normal mood and affect. His behavior is normal. Judgment and thought content normal.  a cane   Lab Results  Component Value Date   WBC 8.5 10/31/2012   HGB 14.9 10/31/2012   HCT 45.3 10/31/2012   PLT 127.0* 10/31/2012   CHOL 132 05/05/2013   TRIG 91.0 05/05/2013   HDL 32.70* 05/05/2013   ALT 11 05/05/2013   AST 18 05/05/2013   NA 140 05/05/2013   K 4.2 05/05/2013   CL 107 05/05/2013   CREATININE 1.1 05/05/2013   BUN 20 05/05/2013   CO2 28 05/05/2013   TSH 1.79 10/31/2012   PSA 1.24 10/31/2012   INR 1.7 10/14/2013   HGBA1C 6.1 04/23/2011         Assessment & Plan:

## 2013-11-04 ENCOUNTER — Telehealth: Payer: Self-pay | Admitting: Internal Medicine

## 2013-11-04 ENCOUNTER — Ambulatory Visit (INDEPENDENT_AMBULATORY_CARE_PROVIDER_SITE_OTHER): Payer: Medicare Other | Admitting: Pharmacist

## 2013-11-04 DIAGNOSIS — Z5181 Encounter for therapeutic drug level monitoring: Secondary | ICD-10-CM

## 2013-11-04 DIAGNOSIS — Z7901 Long term (current) use of anticoagulants: Secondary | ICD-10-CM

## 2013-11-04 DIAGNOSIS — I4891 Unspecified atrial fibrillation: Secondary | ICD-10-CM

## 2013-11-04 LAB — POCT INR: INR: 2.3

## 2013-11-04 NOTE — Telephone Encounter (Signed)
Relevant patient education mailed to patient.  

## 2013-12-10 ENCOUNTER — Ambulatory Visit (INDEPENDENT_AMBULATORY_CARE_PROVIDER_SITE_OTHER): Payer: Medicare Other | Admitting: Pharmacist Clinician (PhC)/ Clinical Pharmacy Specialist

## 2013-12-10 DIAGNOSIS — Z5181 Encounter for therapeutic drug level monitoring: Secondary | ICD-10-CM

## 2013-12-10 DIAGNOSIS — I4891 Unspecified atrial fibrillation: Secondary | ICD-10-CM

## 2013-12-10 DIAGNOSIS — Z7901 Long term (current) use of anticoagulants: Secondary | ICD-10-CM

## 2013-12-10 LAB — POCT INR: INR: 2.3

## 2013-12-16 ENCOUNTER — Other Ambulatory Visit: Payer: Self-pay | Admitting: Internal Medicine

## 2013-12-23 ENCOUNTER — Other Ambulatory Visit: Payer: Self-pay | Admitting: Internal Medicine

## 2013-12-24 ENCOUNTER — Telehealth: Payer: Self-pay | Admitting: Internal Medicine

## 2013-12-24 MED ORDER — WARFARIN SODIUM 2.5 MG PO TABS: ORAL_TABLET | ORAL | Status: DC

## 2013-12-24 NOTE — Telephone Encounter (Signed)
New message     [facility]

## 2013-12-24 NOTE — Telephone Encounter (Signed)
Refill done as requested 

## 2013-12-25 ENCOUNTER — Other Ambulatory Visit: Payer: Self-pay | Admitting: Surgery

## 2013-12-25 MED ORDER — WARFARIN SODIUM 2.5 MG PO TABS: ORAL_TABLET | ORAL | Status: DC

## 2013-12-25 NOTE — Telephone Encounter (Signed)
Refilled per pt request.

## 2013-12-31 ENCOUNTER — Encounter: Payer: Self-pay | Admitting: Internal Medicine

## 2013-12-31 ENCOUNTER — Telehealth: Payer: Self-pay | Admitting: Internal Medicine

## 2013-12-31 ENCOUNTER — Ambulatory Visit (INDEPENDENT_AMBULATORY_CARE_PROVIDER_SITE_OTHER): Payer: Medicare Other | Admitting: Internal Medicine

## 2013-12-31 VITALS — BP 124/70 | HR 68 | Temp 98.5°F | Resp 16 | Wt 205.0 lb

## 2013-12-31 DIAGNOSIS — I10 Essential (primary) hypertension: Secondary | ICD-10-CM

## 2013-12-31 DIAGNOSIS — L02214 Cutaneous abscess of groin: Secondary | ICD-10-CM

## 2013-12-31 DIAGNOSIS — Z7901 Long term (current) use of anticoagulants: Secondary | ICD-10-CM

## 2013-12-31 DIAGNOSIS — L02219 Cutaneous abscess of trunk, unspecified: Secondary | ICD-10-CM

## 2013-12-31 DIAGNOSIS — I2510 Atherosclerotic heart disease of native coronary artery without angina pectoris: Secondary | ICD-10-CM

## 2013-12-31 DIAGNOSIS — L03319 Cellulitis of trunk, unspecified: Secondary | ICD-10-CM

## 2013-12-31 DIAGNOSIS — K5909 Other constipation: Secondary | ICD-10-CM

## 2013-12-31 DIAGNOSIS — K4090 Unilateral inguinal hernia, without obstruction or gangrene, not specified as recurrent: Secondary | ICD-10-CM

## 2013-12-31 MED ORDER — POLYETHYLENE GLYCOL 3350 17 GM/SCOOP PO POWD: 17.0000 g | ORAL | Status: DC | PRN

## 2013-12-31 MED ORDER — MUPIROCIN 2 % EX OINT: TOPICAL_OINTMENT | CUTANEOUS | Status: DC

## 2013-12-31 MED ORDER — DOXYCYCLINE HYCLATE 100 MG PO TABS: 100.0000 mg | ORAL_TABLET | Freq: Two times a day (BID) | ORAL | Status: DC

## 2013-12-31 NOTE — Assessment & Plan Note (Signed)
Continue with current prescription therapy as reflected on the Med list.  

## 2013-12-31 NOTE — Patient Instructions (Signed)
Wound instructions provided.    Please contact us if you notice a recollection of pus in the abscess fever and chills increased pain redness red streaks near the abscess increased swelling in the area. 

## 2013-12-31 NOTE — Telephone Encounter (Signed)
Pt is bleeding in the groin area.  He states it is not rectal.  He states it started a couple of hours ago.  He wants to know if he should go to the ER.  Did not want to go to another office.  Could he be worked in?

## 2013-12-31 NOTE — Assessment & Plan Note (Signed)
Miralax prn 

## 2013-12-31 NOTE — Progress Notes (Signed)
Subjective:    HPI  C/o painfull L groin and bleeding this am   The patient presents for a follow-up of  chronic hypertension, CAD, chronic dyslipidemia, TIAs/auras controlled with medicines - no relapse lately. Opal died.      Review of Systems  Constitutional: Negative for appetite change, fatigue and unexpected weight change.  HENT: Negative for congestion, nosebleeds, sneezing, sore throat and trouble swallowing.   Eyes: Negative for itching and visual disturbance.  Respiratory: Negative for cough.   Cardiovascular: Negative for chest pain, palpitations and leg swelling.  Gastrointestinal: Negative for nausea, diarrhea, blood in stool and abdominal distention.  Genitourinary: Negative for frequency and hematuria.  Musculoskeletal: Negative for back pain, gait problem, joint swelling and neck pain.  Skin: Negative for rash.  Neurological: Negative for dizziness, tremors, speech difficulty and weakness.  Psychiatric/Behavioral: Negative for sleep disturbance, dysphoric mood and agitation. The patient is not nervous/anxious.    Wt Readings from Last 3 Encounters:  12/31/13 205 lb (92.987 kg)  11/03/13 210 lb (95.255 kg)  05/19/13 208 lb (94.348 kg)   BP Readings from Last 3 Encounters:  12/31/13 124/70  11/03/13 130/86  05/19/13 130/80        Objective:   Physical Exam  Constitutional: He is oriented to person, place, and time. He appears well-developed.  HENT:  Mouth/Throat: Oropharynx is clear and moist.  Eyes: Conjunctivae are normal. Pupils are equal, round, and reactive to light.  Neck: Normal range of motion. No JVD present. No thyromegaly present.  Cardiovascular: Normal rate, normal heart sounds and intact distal pulses.  Exam reveals no gallop and no friction rub.   No murmur heard. irreg  Pulmonary/Chest: Effort normal and breath sounds normal. No respiratory distress. He has no wheezes. He has no rales. He exhibits no tenderness.  Abdominal: Soft.  Bowel sounds are normal. He exhibits no distension and no mass. There is no tenderness. There is no rebound and no guarding.  Genitourinary: Rectum normal and prostate normal. Guaiac negative stool.  Musculoskeletal: Normal range of motion. He exhibits edema (trace). He exhibits no tenderness.  Lymphadenopathy:    He has no cervical adenopathy.  Neurological: He is alert and oriented to person, place, and time. He has normal reflexes. No cranial nerve deficit. He exhibits normal muscle tone. Coordination normal.  Skin: Skin is warm and dry. No rash noted.  Psychiatric: He has a normal mood and affect. His behavior is normal. Judgment and thought content normal.  a cane   Lab Results  Component Value Date   WBC 8.5 10/31/2012   HGB 14.9 10/31/2012   HCT 45.3 10/31/2012   PLT 127.0* 10/31/2012   CHOL 132 05/05/2013   TRIG 91.0 05/05/2013   HDL 32.70* 05/05/2013   ALT 15 11/03/2013   AST 19 11/03/2013   NA 139 11/03/2013   K 4.3 11/03/2013   CL 105 11/03/2013   CREATININE 1.1 11/03/2013   BUN 17 11/03/2013   CO2 27 11/03/2013   TSH 1.92 11/03/2013   PSA 1.24 10/31/2012   INR 2.3 12/10/2013   HGBA1C 6.1 04/23/2011   Procedure note:  Incision and Drainage of an Abscess   Indication : a localized collection of pus that is tender and partially ruptured.    Risks including unsuccessful procedure , possible need for a repeat procedure due to pus accumulation, scar formation, and others as well as benefits were explained to the patient in detail. Written consent was obtained/signed.    The patient was  placed in a decubitus position. The area of an abscess on R post calf was prepped with povidone-iodine and draped in a sterile fashion. Local w/1 cc Lido/Epi sol. Abscess roof incised, and about 1/2 cc of purulent material was expressed.The cavity was cleaned.   The wound was dressed with antibiotic ointment and packed.  Tolerated well. Complications: None.        Assessment & Plan:

## 2013-12-31 NOTE — Progress Notes (Signed)
Pre visit review using our clinic review tool, if applicable. No additional management support is needed unless otherwise documented below in the visit note. 

## 2013-12-31 NOTE — Assessment & Plan Note (Signed)
NT on exam

## 2013-12-31 NOTE — Telephone Encounter (Signed)
Per Dr. Alain Marion, pt is scheduled to come in at 11:15 today.

## 2013-12-31 NOTE — Assessment & Plan Note (Signed)
Will I&D

## 2013-12-31 NOTE — Assessment & Plan Note (Signed)
I&D w/a higher risk of bleeding

## 2014-01-01 ENCOUNTER — Telehealth: Payer: Self-pay | Admitting: Pharmacist

## 2014-01-01 MED ORDER — WARFARIN SODIUM 2.5 MG PO TABS: ORAL_TABLET | ORAL | Status: DC

## 2014-01-01 NOTE — Telephone Encounter (Signed)
[facility] called to get confirmation on recent warfarin prescription (#30 for 90 day supply).  I corrected this, #90 (90 day supply) with 1 refill and gave verbal prescription.  Updated meds.

## 2014-01-05 ENCOUNTER — Ambulatory Visit (INDEPENDENT_AMBULATORY_CARE_PROVIDER_SITE_OTHER): Payer: Medicare Other | Admitting: *Deleted

## 2014-01-05 DIAGNOSIS — I4891 Unspecified atrial fibrillation: Secondary | ICD-10-CM

## 2014-01-05 DIAGNOSIS — Z7901 Long term (current) use of anticoagulants: Secondary | ICD-10-CM

## 2014-01-05 DIAGNOSIS — Z5181 Encounter for therapeutic drug level monitoring: Secondary | ICD-10-CM

## 2014-01-05 LAB — POCT INR: INR: 2.6

## 2014-02-16 ENCOUNTER — Ambulatory Visit (INDEPENDENT_AMBULATORY_CARE_PROVIDER_SITE_OTHER): Payer: Medicare Other

## 2014-02-16 DIAGNOSIS — I4891 Unspecified atrial fibrillation: Secondary | ICD-10-CM

## 2014-02-16 DIAGNOSIS — Z7901 Long term (current) use of anticoagulants: Secondary | ICD-10-CM

## 2014-02-16 DIAGNOSIS — Z5181 Encounter for therapeutic drug level monitoring: Secondary | ICD-10-CM

## 2014-02-16 LAB — POCT INR: INR: 2.4

## 2014-03-12 ENCOUNTER — Other Ambulatory Visit: Payer: Self-pay

## 2014-03-12 MED ORDER — PRAVASTATIN SODIUM 40 MG PO TABS: ORAL_TABLET | ORAL | Status: DC

## 2014-03-30 ENCOUNTER — Ambulatory Visit (INDEPENDENT_AMBULATORY_CARE_PROVIDER_SITE_OTHER): Payer: Medicare Other | Admitting: Pharmacist Clinician (PhC)/ Clinical Pharmacy Specialist

## 2014-03-30 DIAGNOSIS — I4891 Unspecified atrial fibrillation: Secondary | ICD-10-CM

## 2014-03-30 DIAGNOSIS — Z5181 Encounter for therapeutic drug level monitoring: Secondary | ICD-10-CM

## 2014-03-30 DIAGNOSIS — Z7901 Long term (current) use of anticoagulants: Secondary | ICD-10-CM

## 2014-03-30 LAB — POCT INR: INR: 2.1

## 2014-04-09 ENCOUNTER — Other Ambulatory Visit: Payer: Self-pay | Admitting: Internal Medicine

## 2014-05-07 ENCOUNTER — Ambulatory Visit (INDEPENDENT_AMBULATORY_CARE_PROVIDER_SITE_OTHER): Payer: Medicare Other | Admitting: Internal Medicine

## 2014-05-07 ENCOUNTER — Encounter: Payer: Self-pay | Admitting: Internal Medicine

## 2014-05-07 VITALS — BP 130/70 | HR 72 | Temp 98.5°F | Resp 16 | Ht 71.0 in | Wt 209.0 lb

## 2014-05-07 DIAGNOSIS — R350 Frequency of micturition: Secondary | ICD-10-CM

## 2014-05-07 DIAGNOSIS — I2510 Atherosclerotic heart disease of native coronary artery without angina pectoris: Secondary | ICD-10-CM

## 2014-05-07 DIAGNOSIS — Z23 Encounter for immunization: Secondary | ICD-10-CM

## 2014-05-07 DIAGNOSIS — Z8679 Personal history of other diseases of the circulatory system: Secondary | ICD-10-CM

## 2014-05-07 DIAGNOSIS — Z0000 Encounter for general adult medical examination without abnormal findings: Secondary | ICD-10-CM

## 2014-05-07 DIAGNOSIS — D696 Thrombocytopenia, unspecified: Secondary | ICD-10-CM

## 2014-05-07 NOTE — Assessment & Plan Note (Signed)
Here for medicare wellness/physical  Diet: heart healthy  Physical activity: sedentary  Depression/mood screen: negative  Hearing: intact to whispered voice  Visual acuity: grossly normal, performs annual eye exam  ADLs: capable  Fall risk:moderate/cane use Home safety: good  Cognitive evaluation: intact to orientation, naming, recall and repetition  EOL planning: adv directives, full code/ I agree  I have personally reviewed and have noted  1. The patient's medical and social history  2. Their use of alcohol, tobacco or illicit drugs  3. Their current medications and supplements  4. The patient's functional ability including ADL's, fall risks, home safety risks and hearing or visual impairment.  5. Diet and physical activities  6. Evidence for depression or mood disorders    Today patient counseled on age appropriate routine health concerns for screening and prevention, each reviewed and up to date or declined. Immunizations reviewed and up to date or declined. Labs ordered and reviewed. Risk factors for depression reviewed and negative. Hearing function and visual acuity are intact. ADLs screened and addressed as needed. Functional ability and level of safety reviewed and appropriate. Education, counseling and referrals performed based on assessed risks today. Patient provided with a copy of personalized plan for preventive services.

## 2014-05-07 NOTE — Assessment & Plan Note (Signed)
Continue with current prescription therapy as reflected on the Med list.  

## 2014-05-07 NOTE — Progress Notes (Signed)
  Subjective:    HPI   The patient is here for a wellness exam. The patient has been doing well overall without major physical or psychological issues going on lately.   The patient presents for a follow-up of  chronic hypertension, CAD, chronic dyslipidemia, TIAs/auras controlled with medicines - no relapse lately (2 per year)      Review of Systems  Constitutional: Negative for appetite change, fatigue and unexpected weight change.  HENT: Negative for congestion, nosebleeds, sneezing, sore throat and trouble swallowing.   Eyes: Negative for itching and visual disturbance.  Respiratory: Negative for cough.   Cardiovascular: Negative for chest pain, palpitations and leg swelling.  Gastrointestinal: Negative for nausea, diarrhea, blood in stool and abdominal distention.  Genitourinary: Negative for frequency and hematuria.  Musculoskeletal: Negative for back pain, gait problem, joint swelling and neck pain.  Skin: Negative for rash.  Neurological: Negative for dizziness, tremors, speech difficulty and weakness.  Psychiatric/Behavioral: Negative for sleep disturbance, dysphoric mood and agitation. The patient is not nervous/anxious.    Wt Readings from Last 3 Encounters:  05/07/14 209 lb (94.802 kg)  12/31/13 205 lb (92.987 kg)  11/03/13 210 lb (95.255 kg)   BP Readings from Last 3 Encounters:  05/07/14 130/70  12/31/13 124/70  11/03/13 130/86        Objective:   Physical Exam  Constitutional: He is oriented to person, place, and time. He appears well-developed.  HENT:  Mouth/Throat: Oropharynx is clear and moist.  Eyes: Conjunctivae are normal. Pupils are equal, round, and reactive to light.  Neck: Normal range of motion. No JVD present. No thyromegaly present.  Cardiovascular: Normal rate, normal heart sounds and intact distal pulses.  Exam reveals no gallop and no friction rub.   No murmur heard. irreg  Pulmonary/Chest: Effort normal and breath sounds normal. No  respiratory distress. He has no wheezes. He has no rales. He exhibits no tenderness.  Abdominal: Soft. Bowel sounds are normal. He exhibits no distension and no mass. There is no tenderness. There is no rebound and no guarding.  Genitourinary: Rectum normal and prostate normal. Guaiac negative stool.  Musculoskeletal: Normal range of motion. He exhibits edema (trace). He exhibits no tenderness.  Lymphadenopathy:    He has no cervical adenopathy.  Neurological: He is alert and oriented to person, place, and time. He has normal reflexes. No cranial nerve deficit. He exhibits normal muscle tone. Coordination normal.  Skin: Skin is warm and dry. No rash noted.  Psychiatric: He has a normal mood and affect. His behavior is normal. Judgment and thought content normal.  Cane  Lab Results  Component Value Date   WBC 8.5 10/31/2012   HGB 14.9 10/31/2012   HCT 45.3 10/31/2012   PLT 127.0* 10/31/2012   CHOL 132 05/05/2013   TRIG 91.0 05/05/2013   HDL 32.70* 05/05/2013   ALT 15 11/03/2013   AST 19 11/03/2013   NA 139 11/03/2013   K 4.3 11/03/2013   CL 105 11/03/2013   CREATININE 1.1 11/03/2013   BUN 17 11/03/2013   CO2 27 11/03/2013   TSH 1.92 11/03/2013   PSA 1.24 10/31/2012   INR 2.1 03/30/2014   HGBA1C 6.1 04/23/2011         Assessment & Plan:

## 2014-05-07 NOTE — Addendum Note (Signed)
Addended by: Cresenciano Lick on: 05/07/2014 04:50 PM   Modules accepted: Orders

## 2014-05-07 NOTE — Assessment & Plan Note (Signed)
Labs

## 2014-05-07 NOTE — Patient Instructions (Addendum)
Wt Readings from Last 3 Encounters:  05/07/14 209 lb (94.802 kg)  12/31/13 205 lb (92.987 kg)  11/03/13 210 lb (95.255 kg)   Join Silver sneakers or a Chair yoga class   Health Maintenance A healthy lifestyle and preventative care can promote health and wellness.  Maintain regular health, dental, and eye exams.  Eat a healthy diet. Foods like vegetables, fruits, whole grains, low-fat dairy products, and lean protein foods contain the nutrients you need and are low in calories. Decrease your intake of foods high in solid fats, added sugars, and salt. Get information about a proper diet from your health care provider, if necessary.  Regular physical exercise is one of the most important things you can do for your health. Most adults should get at least 150 minutes of moderate-intensity exercise (any activity that increases your heart rate and causes you to sweat) each week. In addition, most adults need muscle-strengthening exercises on 2 or more days a week.   Maintain a healthy weight. The body mass index (BMI) is a screening tool to identify possible weight problems. It provides an estimate of body fat based on height and weight. Your health care provider can find your BMI and can help you achieve or maintain a healthy weight. For males 20 years and older:  A BMI below 18.5 is considered underweight.  A BMI of 18.5 to 24.9 is normal.  A BMI of 25 to 29.9 is considered overweight.  A BMI of 30 and above is considered obese.  Maintain normal blood lipids and cholesterol by exercising and minimizing your intake of saturated fat. Eat a balanced diet with plenty of fruits and vegetables. Blood tests for lipids and cholesterol should begin at age 35 and be repeated every 5 years. If your lipid or cholesterol levels are high, you are over age 32, or you are at high risk for heart disease, you may need your cholesterol levels checked more frequently.Ongoing high lipid and cholesterol levels  should be treated with medicines if diet and exercise are not working.  If you smoke, find out from your health care provider how to quit. If you do not use tobacco, do not start.  Lung cancer screening is recommended for adults aged 58-80 years who are at high risk for developing lung cancer because of a history of smoking. A yearly low-dose CT scan of the lungs is recommended for people who have at least a 30-pack-year history of smoking and are current smokers or have quit within the past 15 years. A pack year of smoking is smoking an average of 1 pack of cigarettes a day for 1 year (for example, a 30-pack-year history of smoking could mean smoking 1 pack a day for 30 years or 2 packs a day for 15 years). Yearly screening should continue until the smoker has stopped smoking for at least 15 years. Yearly screening should be stopped for people who develop a health problem that would prevent them from having lung cancer treatment.  If you choose to drink alcohol, do not have more than 2 drinks per day. One drink is considered to be 12 oz (360 mL) of beer, 5 oz (150 mL) of wine, or 1.5 oz (45 mL) of liquor.  Avoid the use of street drugs. Do not share needles with anyone. Ask for help if you need support or instructions about stopping the use of drugs.  High blood pressure causes heart disease and increases the risk of stroke. Blood pressure should be  checked at least every 1-2 years. Ongoing high blood pressure should be treated with medicines if weight loss and exercise are not effective.  If you are 38-38 years old, ask your health care provider if you should take aspirin to prevent heart disease.  Diabetes screening involves taking a blood sample to check your fasting blood sugar level. This should be done once every 3 years after age 73 if you are at a normal weight and without risk factors for diabetes. Testing should be considered at a younger age or be carried out more frequently if you are  overweight and have at least 1 risk factor for diabetes.  Colorectal cancer can be detected and often prevented. Most routine colorectal cancer screening begins at the age of 102 and continues through age 97. However, your health care provider may recommend screening at an earlier age if you have risk factors for colon cancer. On a yearly basis, your health care provider may provide home test kits to check for hidden blood in the stool. A small camera at the end of a tube may be used to directly examine the colon (sigmoidoscopy or colonoscopy) to detect the earliest forms of colorectal cancer. Talk to your health care provider about this at age 32 when routine screening begins. A direct exam of the colon should be repeated every 5-10 years through age 37, unless early forms of precancerous polyps or small growths are found.  People who are at an increased risk for hepatitis B should be screened for this virus. You are considered at high risk for hepatitis B if:  You were born in a country where hepatitis B occurs often. Talk with your health care provider about which countries are considered high risk.  Your parents were born in a high-risk country and you have not received a shot to protect against hepatitis B (hepatitis B vaccine).  You have HIV or AIDS.  You use needles to inject street drugs.  You live with, or have sex with, someone who has hepatitis B.  You are a man who has sex with other men (MSM).  You get hemodialysis treatment.  You take certain medicines for conditions like cancer, organ transplantation, and autoimmune conditions.  Hepatitis C blood testing is recommended for all people born from [birth year] through 1963 and any individual with known risk factors for hepatitis C.  Healthy men should no longer receive prostate-specific antigen (PSA) blood tests as part of routine cancer screening. Talk to your health care provider about prostate cancer screening.  Testicular cancer  screening is not recommended for adolescents or adult males who have no symptoms. Screening includes self-exam, a health care provider exam, and other screening tests. Consult with your health care provider about any symptoms you have or any concerns you have about testicular cancer.  Practice safe sex. Use condoms and avoid high-risk sexual practices to reduce the spread of sexually transmitted infections (STIs).  You should be screened for STIs, including gonorrhea and chlamydia if:  You are sexually active and are younger than 24 years.  You are older than 24 years, and your health care provider tells you that you are at risk for this type of infection.  Your sexual activity has changed since you were last screened, and you are at an increased risk for chlamydia or gonorrhea. Ask your health care provider if you are at risk.  If you are at risk of being infected with HIV, it is recommended that you take a prescription medicine  daily to prevent HIV infection. This is called pre-exposure prophylaxis (PrEP). You are considered at risk if:  You are a man who has sex with other men (MSM).  You are a heterosexual man who is sexually active with multiple partners.  You take drugs by injection.  You are sexually active with a partner who has HIV.  Talk with your health care provider about whether you are at high risk of being infected with HIV. If you choose to begin PrEP, you should first be tested for HIV. You should then be tested every 3 months for as long as you are taking PrEP.  Use sunscreen. Apply sunscreen liberally and repeatedly throughout the day. You should seek shade when your shadow is shorter than you. Protect yourself by wearing long sleeves, pants, a wide-brimmed hat, and sunglasses year round whenever you are outdoors.  Tell your health care provider of new moles or changes in moles, especially if there is a change in shape or color. Also, tell your health care provider if a  mole is larger than the size of a pencil eraser.  A one-time screening for abdominal aortic aneurysm (AAA) and surgical repair of large AAAs by ultrasound is recommended for men aged 79-75 years who are current or former smokers.  Stay current with your vaccines (immunizations). Document Released: 01/12/2008 Document Revised: 07/21/2013 Document Reviewed: 12/11/2010 [facility] Patient Information 2015 [place], [place]. This information is not intended to replace advice given to you by your health care provider. Make sure you discuss any questions you have with your health care provider.

## 2014-05-07 NOTE — Assessment & Plan Note (Signed)
No relapse 

## 2014-05-07 NOTE — Progress Notes (Signed)
Pre visit review using our clinic review tool, if applicable. No additional management support is needed unless otherwise documented below in the visit note. 

## 2014-05-07 NOTE — Assessment & Plan Note (Signed)
PSA

## 2014-05-11 ENCOUNTER — Ambulatory Visit (INDEPENDENT_AMBULATORY_CARE_PROVIDER_SITE_OTHER): Payer: Medicare Other | Admitting: *Deleted

## 2014-05-11 DIAGNOSIS — Z5181 Encounter for therapeutic drug level monitoring: Secondary | ICD-10-CM

## 2014-05-11 DIAGNOSIS — Z7901 Long term (current) use of anticoagulants: Secondary | ICD-10-CM

## 2014-05-11 DIAGNOSIS — I4891 Unspecified atrial fibrillation: Secondary | ICD-10-CM

## 2014-05-11 LAB — POCT INR: INR: 2.1

## 2014-05-13 ENCOUNTER — Other Ambulatory Visit (INDEPENDENT_AMBULATORY_CARE_PROVIDER_SITE_OTHER): Payer: Medicare Other

## 2014-05-13 DIAGNOSIS — E785 Hyperlipidemia, unspecified: Secondary | ICD-10-CM

## 2014-05-13 DIAGNOSIS — D696 Thrombocytopenia, unspecified: Secondary | ICD-10-CM

## 2014-05-13 DIAGNOSIS — Z125 Encounter for screening for malignant neoplasm of prostate: Secondary | ICD-10-CM

## 2014-05-13 DIAGNOSIS — I2510 Atherosclerotic heart disease of native coronary artery without angina pectoris: Secondary | ICD-10-CM

## 2014-05-13 DIAGNOSIS — Z8679 Personal history of other diseases of the circulatory system: Secondary | ICD-10-CM

## 2014-05-13 DIAGNOSIS — Z0000 Encounter for general adult medical examination without abnormal findings: Secondary | ICD-10-CM

## 2014-05-13 DIAGNOSIS — R350 Frequency of micturition: Secondary | ICD-10-CM

## 2014-05-13 LAB — CBC WITH DIFFERENTIAL/PLATELET
BASOS ABS: 0.1 10*3/uL (ref 0.0–0.1)
Basophils Relative: 0.8 % (ref 0.0–3.0)
EOS PCT: 5.1 % — AB (ref 0.0–5.0)
Eosinophils Absolute: 0.5 10*3/uL (ref 0.0–0.7)
HEMATOCRIT: 40.3 % (ref 39.0–52.0)
Hemoglobin: 12.7 g/dL — ABNORMAL LOW (ref 13.0–17.0)
LYMPHS ABS: 1.4 10*3/uL (ref 0.7–4.0)
Lymphocytes Relative: 14.9 % (ref 12.0–46.0)
MCHC: 31.6 g/dL (ref 30.0–36.0)
MCV: 84.2 fl (ref 78.0–100.0)
MONO ABS: 0.7 10*3/uL (ref 0.1–1.0)
Monocytes Relative: 7.9 % (ref 3.0–12.0)
NEUTROS PCT: 71.3 % (ref 43.0–77.0)
Neutro Abs: 6.6 10*3/uL (ref 1.4–7.7)
PLATELETS: 242 10*3/uL (ref 150.0–400.0)
RBC: 4.78 Mil/uL (ref 4.22–5.81)
RDW: 16 % — AB (ref 11.5–15.5)
WBC: 9.2 10*3/uL (ref 4.0–10.5)

## 2014-05-13 LAB — URINALYSIS
Bilirubin Urine: NEGATIVE
Hgb urine dipstick: NEGATIVE
Ketones, ur: NEGATIVE
Leukocytes, UA: NEGATIVE
Nitrite: NEGATIVE
Specific Gravity, Urine: 1.015
Urine Glucose: NEGATIVE
Urobilinogen, UA: 2 — AB
pH: 6 (ref 5.0–8.0)

## 2014-05-13 LAB — LIPID PANEL
Cholesterol: 135 mg/dL (ref 0–200)
HDL: 20.9 mg/dL — AB (ref >39.00)
LDL CALC: 94 mg/dL (ref 0–99)
NonHDL: 114.1
Total CHOL/HDL Ratio: 6
Triglycerides: 100 mg/dL (ref 0.0–149.0)
VLDL: 20 mg/dL (ref 0.0–40.0)

## 2014-05-13 LAB — HEPATIC FUNCTION PANEL
ALT: 15 U/L (ref 0–53)
AST: 20 U/L (ref 0–37)
Albumin: 3.1 g/dL — ABNORMAL LOW (ref 3.5–5.2)
Alkaline Phosphatase: 86 U/L (ref 39–117)
Bilirubin, Direct: 0.3 mg/dL (ref 0.0–0.3)
Total Bilirubin: 1 mg/dL (ref 0.2–1.2)
Total Protein: 7.5 g/dL (ref 6.0–8.3)

## 2014-05-13 LAB — BASIC METABOLIC PANEL WITH GFR
BUN: 16 mg/dL (ref 6–23)
CO2: 27 meq/L (ref 19–32)
Calcium: 9.4 mg/dL (ref 8.4–10.5)
Chloride: 103 meq/L (ref 96–112)
Creatinine, Ser: 1.1 mg/dL (ref 0.4–1.5)
GFR: 68.48 mL/min
Glucose, Bld: 111 mg/dL — ABNORMAL HIGH (ref 70–99)
Potassium: 4.7 meq/L (ref 3.5–5.1)
Sodium: 138 meq/L (ref 135–145)

## 2014-05-13 LAB — TSH: TSH: 1.6 u[IU]/mL (ref 0.35–4.50)

## 2014-05-13 LAB — PSA: PSA: 2.77 ng/mL (ref 0.10–4.00)

## 2014-05-27 ENCOUNTER — Ambulatory Visit (INDEPENDENT_AMBULATORY_CARE_PROVIDER_SITE_OTHER): Payer: Medicare Other | Admitting: Internal Medicine

## 2014-05-27 ENCOUNTER — Encounter: Payer: Self-pay | Admitting: Internal Medicine

## 2014-05-27 VITALS — BP 124/72 | HR 78 | Ht 71.0 in | Wt 207.0 lb

## 2014-05-27 DIAGNOSIS — Z7901 Long term (current) use of anticoagulants: Secondary | ICD-10-CM

## 2014-05-27 DIAGNOSIS — I482 Chronic atrial fibrillation, unspecified: Secondary | ICD-10-CM

## 2014-05-27 DIAGNOSIS — I2510 Atherosclerotic heart disease of native coronary artery without angina pectoris: Secondary | ICD-10-CM

## 2014-05-27 NOTE — Patient Instructions (Signed)
Your physician wants you to follow-up in: 12 months with Dr. Taylor. You will receive a reminder letter in the mail two months in advance. If you don't receive a letter, please call our office to schedule the follow-up appointment.    

## 2014-05-30 ENCOUNTER — Encounter: Payer: Self-pay | Admitting: Internal Medicine

## 2014-05-31 NOTE — Progress Notes (Signed)
HPI John [name] returns today for followup. He is a very pleasant 80 year old man with chronic atrial fibrillation and hypertension, who is been well-controlled on medical therapy. He denies chest pain, shortness of breath, or syncope. He is fairly sedentary. His main problem is arthritis.  Allergies  Allergen Reactions  . Polysporin [Bacitracin-Polymyxin B] Other (See Comments)    Doesn't remember reaction     Current Outpatient Prescriptions  Medication Sig Dispense Refill  . amLODipine (NORVASC) 2.5 MG tablet Take 1 tablet (2.5 mg total) by mouth 2 (two) times daily. 180 tablet 3  . Cholecalciferol (VITAMIN D) 1000 UNITS capsule Take 1,000 Units by mouth daily.      . Diphenhydramine-APAP, sleep, (TYLENOL PM EXTRA STRENGTH PO) Take 1 capsule by mouth at bedtime as needed.    . furosemide (LASIX) 20 MG tablet TAKE 1 TO 2 TABLETS (20 TO 40 MG TOTAL) DAILY AS NEEDED FOR EDEMA 180 tablet 0  . metoprolol succinate (TOPROL-XL) 50 MG 24 hr tablet TAKE 1/2 TABLET TWICE A DAY    . Multiple Vitamins-Minerals (ICAPS MV) TABS Take 1 tablet by mouth daily.      Marland Kitchen omeprazole (PRILOSEC) 40 MG capsule TAKE 1 CAPSULE DAILY 90 capsule 2  . polyethylene glycol powder (GLYCOLAX/MIRALAX) powder Take 17 g by mouth as needed. 527 g 6  . pravastatin (PRAVACHOL) 40 MG tablet TAKE 1 TABLET DAILY 90 tablet 1  . Tamsulosin HCl (FLOMAX) 0.4 MG CAPS daily.     Marland Kitchen triamcinolone cream (KENALOG) 0.1 % Apply as directed    . warfarin (COUMADIN) 2.5 MG tablet Take as directed by coumadin clinic 90 tablet 1   No current facility-administered medications for this visit.     Past Medical History  Diagnosis Date  . Anemia     low blood platelets  . Arthritis   . Blood transfusion   . Cancer     skin CA  . Cataract   . Heart murmur   . Hyperlipidemia   . Hypertension   . TIA (transient ischemic attack)   . Atrial fibrillation     on Coumadin  . Myocardial infarction     after colon surgery    ROS:   All  systems reviewed and negative except as noted in the HPI.   Past Surgical History  Procedure Laterality Date  . Colon surgery      partial colectomy  . Total hip arthroplasty      LEFT HIP  . Coronary artery bypass graft      3 vessels  . Heart stent    . Thoracotomy    . Lens implants    . Colon surgery      right     Family History  Problem Relation Age of Onset  . Colon polyps Mother   . Stroke Father   . Stroke Other      History   Social History  . Marital Status: Widowed    Spouse Name: Opal Tippin    Number of Children: N/A  . Years of Education: N/A   Occupational History  .     Social History Main Topics  . Smoking status: Former Smoker    Quit date: 10/28/1965  . Smokeless tobacco: Not on file  . Alcohol Use: 0.0 oz/week  . Drug Use: Not on file  . Sexual Activity: Yes   Other Topics Concern  . Not on file   Social History Narrative     BP 124/72 mmHg  Pulse  78  Ht 5\' 11"  (1.803 m)  Wt 207 lb (93.895 kg)  BMI 28.88 kg/m2  Physical Exam:  Well appearing elderly man, NAD HEENT: Unremarkable Neck:  6 cm JVD, no thyromegally Lungs:  Clear with no wheezes, rales, or rhonchi. HEART:  IRegular rate rhythm, no murmurs, no rubs, no clicks Abd:  soft, positive bowel sounds, no organomegally, no rebound, no guarding Ext:  2 plus pulses, trace edema in left lower extremity, no cyanosis, no clubbing Skin:  No rashes no nodules Neuro:  CN II through XII intact, motor grossly intact  EKG Atrial fibrillation with a controlled ventricular response  Assess/Plan:

## 2014-05-31 NOTE — Assessment & Plan Note (Signed)
He is tolerating his systemic anti-coagulation. No change in medical therapy

## 2014-05-31 NOTE — Assessment & Plan Note (Signed)
His ventricular rate is well controlled. He will continue a strategy of rate control and continue systemic anticoagulation.

## 2014-05-31 NOTE — Assessment & Plan Note (Signed)
He denies anginal symptoms. No change in meds.  

## 2014-06-11 ENCOUNTER — Other Ambulatory Visit: Payer: Self-pay | Admitting: Internal Medicine

## 2014-06-11 ENCOUNTER — Other Ambulatory Visit: Payer: Self-pay | Admitting: *Deleted

## 2014-06-22 ENCOUNTER — Ambulatory Visit (INDEPENDENT_AMBULATORY_CARE_PROVIDER_SITE_OTHER): Payer: Medicare Other | Admitting: *Deleted

## 2014-06-22 DIAGNOSIS — I482 Chronic atrial fibrillation, unspecified: Secondary | ICD-10-CM

## 2014-06-22 DIAGNOSIS — I4891 Unspecified atrial fibrillation: Secondary | ICD-10-CM

## 2014-06-22 DIAGNOSIS — Z5181 Encounter for therapeutic drug level monitoring: Secondary | ICD-10-CM

## 2014-06-22 DIAGNOSIS — Z7901 Long term (current) use of anticoagulants: Secondary | ICD-10-CM

## 2014-06-22 LAB — POCT INR: INR: 2.5

## 2014-07-08 ENCOUNTER — Other Ambulatory Visit: Payer: Self-pay | Admitting: Internal Medicine

## 2014-08-03 ENCOUNTER — Ambulatory Visit (INDEPENDENT_AMBULATORY_CARE_PROVIDER_SITE_OTHER): Payer: Medicare Other

## 2014-08-03 DIAGNOSIS — Z5181 Encounter for therapeutic drug level monitoring: Secondary | ICD-10-CM

## 2014-08-03 DIAGNOSIS — I4891 Unspecified atrial fibrillation: Secondary | ICD-10-CM

## 2014-08-03 DIAGNOSIS — Z7901 Long term (current) use of anticoagulants: Secondary | ICD-10-CM

## 2014-08-03 LAB — POCT INR: INR: 2.8

## 2014-08-16 ENCOUNTER — Other Ambulatory Visit: Payer: Self-pay | Admitting: Internal Medicine

## 2014-08-19 ENCOUNTER — Other Ambulatory Visit: Payer: Self-pay | Admitting: Internal Medicine

## 2014-08-27 ENCOUNTER — Other Ambulatory Visit: Payer: Self-pay | Admitting: Internal Medicine

## 2014-08-28 ENCOUNTER — Other Ambulatory Visit: Payer: Self-pay | Admitting: Internal Medicine

## 2014-09-16 ENCOUNTER — Ambulatory Visit (INDEPENDENT_AMBULATORY_CARE_PROVIDER_SITE_OTHER): Payer: Medicare Other | Admitting: *Deleted

## 2014-09-16 DIAGNOSIS — I4891 Unspecified atrial fibrillation: Secondary | ICD-10-CM

## 2014-09-16 DIAGNOSIS — Z5181 Encounter for therapeutic drug level monitoring: Secondary | ICD-10-CM

## 2014-09-16 DIAGNOSIS — Z7901 Long term (current) use of anticoagulants: Secondary | ICD-10-CM

## 2014-09-16 LAB — POCT INR: INR: 2.4

## 2014-10-26 ENCOUNTER — Ambulatory Visit (INDEPENDENT_AMBULATORY_CARE_PROVIDER_SITE_OTHER): Payer: Medicare Other

## 2014-10-26 DIAGNOSIS — Z7901 Long term (current) use of anticoagulants: Secondary | ICD-10-CM | POA: Diagnosis not present

## 2014-10-26 DIAGNOSIS — Z5181 Encounter for therapeutic drug level monitoring: Secondary | ICD-10-CM | POA: Diagnosis not present

## 2014-10-26 DIAGNOSIS — I4891 Unspecified atrial fibrillation: Secondary | ICD-10-CM

## 2014-10-26 LAB — POCT INR: INR: 3

## 2014-11-08 ENCOUNTER — Other Ambulatory Visit (INDEPENDENT_AMBULATORY_CARE_PROVIDER_SITE_OTHER): Payer: Medicare Other

## 2014-11-08 ENCOUNTER — Encounter: Payer: Self-pay | Admitting: Internal Medicine

## 2014-11-08 ENCOUNTER — Ambulatory Visit (INDEPENDENT_AMBULATORY_CARE_PROVIDER_SITE_OTHER): Payer: Medicare Other | Admitting: Internal Medicine

## 2014-11-08 VITALS — BP 122/68 | HR 83 | Temp 97.5°F | Resp 16 | Wt 199.0 lb

## 2014-11-08 DIAGNOSIS — I482 Chronic atrial fibrillation, unspecified: Secondary | ICD-10-CM

## 2014-11-08 DIAGNOSIS — R32 Unspecified urinary incontinence: Secondary | ICD-10-CM | POA: Insufficient documentation

## 2014-11-08 DIAGNOSIS — I2510 Atherosclerotic heart disease of native coronary artery without angina pectoris: Secondary | ICD-10-CM | POA: Diagnosis not present

## 2014-11-08 DIAGNOSIS — K4090 Unilateral inguinal hernia, without obstruction or gangrene, not specified as recurrent: Secondary | ICD-10-CM

## 2014-11-08 DIAGNOSIS — I10 Essential (primary) hypertension: Secondary | ICD-10-CM

## 2014-11-08 DIAGNOSIS — Z7901 Long term (current) use of anticoagulants: Secondary | ICD-10-CM

## 2014-11-08 DIAGNOSIS — N3941 Urge incontinence: Secondary | ICD-10-CM

## 2014-11-08 LAB — BASIC METABOLIC PANEL
BUN: 18 mg/dL (ref 6–23)
CO2: 28 meq/L (ref 19–32)
Calcium: 10.1 mg/dL (ref 8.4–10.5)
Chloride: 101 mEq/L (ref 96–112)
Creatinine, Ser: 0.91 mg/dL (ref 0.40–1.50)
GFR: 83.35 mL/min (ref >60.00)
GLUCOSE: 135 mg/dL — AB (ref 70–99)
POTASSIUM: 4.8 meq/L (ref 3.5–5.1)
Sodium: 135 mEq/L (ref 135–145)

## 2014-11-08 LAB — CBC WITH DIFFERENTIAL/PLATELET
Basophils Absolute: 0 10*3/uL (ref 0.0–0.1)
Basophils Relative: 0.4 % (ref 0.0–3.0)
EOS PCT: 2.2 % (ref 0.0–5.0)
Eosinophils Absolute: 0.3 10*3/uL (ref 0.0–0.7)
HEMATOCRIT: 35.6 % — AB (ref 39.0–52.0)
HEMOGLOBIN: 11.6 g/dL — AB (ref 13.0–17.0)
LYMPHS ABS: 1.2 10*3/uL (ref 0.7–4.0)
Lymphocytes Relative: 10.8 % — ABNORMAL LOW (ref 12.0–46.0)
MCHC: 32.6 g/dL (ref 30.0–36.0)
MCV: 78.5 fl (ref 78.0–100.0)
Monocytes Absolute: 0.8 10*3/uL (ref 0.1–1.0)
Monocytes Relative: 7.4 % (ref 3.0–12.0)
Neutro Abs: 9 10*3/uL — ABNORMAL HIGH (ref 1.4–7.7)
Neutrophils Relative %: 79.2 % — ABNORMAL HIGH (ref 43.0–77.0)
PLATELETS: 272 10*3/uL (ref 150.0–400.0)
RBC: 4.54 Mil/uL (ref 4.22–5.81)
RDW: 17.3 % — ABNORMAL HIGH (ref 11.5–15.5)
WBC: 11.4 10*3/uL — AB (ref 4.0–10.5)

## 2014-11-08 MED ORDER — TRIAMCINOLONE ACETONIDE 0.1 % EX CREA: TOPICAL_CREAM | Freq: Three times a day (TID) | CUTANEOUS | Status: DC

## 2014-11-08 NOTE — Progress Notes (Signed)
   Subjective:    HPI   The patient presents for a follow-up of  chronic hypertension, CAD, chronic dyslipidemia, TIAs/auras controlled with medicines - no relapse lately. Opal died a while ago.      Review of Systems  Constitutional: Negative for appetite change, fatigue and unexpected weight change.  HENT: Negative for congestion, nosebleeds, sneezing, sore throat and trouble swallowing.   Eyes: Negative for itching and visual disturbance.  Respiratory: Negative for cough.   Cardiovascular: Negative for chest pain, palpitations and leg swelling.  Gastrointestinal: Negative for nausea, diarrhea, blood in stool and abdominal distention.  Genitourinary: Negative for frequency and hematuria.  Musculoskeletal: Negative for back pain, joint swelling, gait problem and neck pain.  Skin: Negative for rash.  Neurological: Negative for dizziness, tremors, speech difficulty and weakness.  Psychiatric/Behavioral: Negative for sleep disturbance, dysphoric mood and agitation. The patient is not nervous/anxious.    Wt Readings from Last 3 Encounters:  11/08/14 199 lb (90.266 kg)  05/27/14 207 lb (93.895 kg)  05/07/14 209 lb (94.802 kg)   BP Readings from Last 3 Encounters:  11/08/14 122/68  05/27/14 124/72  05/07/14 130/70        Objective:   Physical Exam  Constitutional: He is oriented to person, place, and time. He appears well-developed.  HENT:  Mouth/Throat: Oropharynx is clear and moist.  Eyes: Conjunctivae are normal. Pupils are equal, round, and reactive to light.  Neck: Normal range of motion. No JVD present. No thyromegaly present.  Cardiovascular: Normal rate, normal heart sounds and intact distal pulses.  Exam reveals no gallop and no friction rub.   No murmur heard. irreg  Pulmonary/Chest: Effort normal and breath sounds normal. No respiratory distress. He has no wheezes. He has no rales. He exhibits no tenderness.  Abdominal: Soft. Bowel sounds are normal. He  exhibits no distension and no mass. There is no tenderness. There is no rebound and no guarding.  Genitourinary: Rectum normal and prostate normal. Guaiac negative stool.  Musculoskeletal: Normal range of motion. He exhibits edema (trace). He exhibits no tenderness.  Lymphadenopathy:    He has no cervical adenopathy.  Neurological: He is alert and oriented to person, place, and time. He has normal reflexes. No cranial nerve deficit. He exhibits normal muscle tone. Coordination normal.  Skin: Skin is warm and dry. No rash noted.  Psychiatric: He has a normal mood and affect. His behavior is normal. Judgment and thought content normal.  a cane L ing hernia   Lab Results  Component Value Date   WBC 9.2 05/13/2014   HGB 12.7* 05/13/2014   HCT 40.3 05/13/2014   PLT 242.0 05/13/2014   CHOL 135 05/13/2014   TRIG 100.0 05/13/2014   HDL 20.90* 05/13/2014   ALT 15 05/13/2014   AST 20 05/13/2014   NA 138 05/13/2014   K 4.7 05/13/2014   CL 103 05/13/2014   CREATININE 1.1 05/13/2014   BUN 16 05/13/2014   CO2 27 05/13/2014   TSH 1.60 05/13/2014   PSA 2.77 05/13/2014   INR 3.0 10/26/2014   HGBA1C 6.1 04/23/2011           Assessment & Plan:

## 2014-11-08 NOTE — Assessment & Plan Note (Signed)
Dr Jeffie Pollock Hold Lasix

## 2014-11-08 NOTE — Assessment & Plan Note (Signed)
Rate controlled: Metoprolol, Coumadin

## 2014-11-08 NOTE — Assessment & Plan Note (Signed)
on Coumadin

## 2014-11-08 NOTE — Assessment & Plan Note (Signed)
Chronic On Lasix Metoprolol, Amlodipine

## 2014-11-08 NOTE — Progress Notes (Signed)
Pre visit review using our clinic review tool, if applicable. No additional management support is needed unless otherwise documented below in the visit note. 

## 2014-11-08 NOTE — Assessment & Plan Note (Signed)
On Pravachol, Metoprolol, Amlodipine

## 2014-11-08 NOTE — Assessment & Plan Note (Signed)
Discussed: no pain

## 2014-11-10 ENCOUNTER — Other Ambulatory Visit: Payer: Self-pay | Admitting: *Deleted

## 2014-11-10 DIAGNOSIS — D649 Anemia, unspecified: Secondary | ICD-10-CM

## 2014-11-10 DIAGNOSIS — D696 Thrombocytopenia, unspecified: Secondary | ICD-10-CM

## 2014-11-10 DIAGNOSIS — R739 Hyperglycemia, unspecified: Secondary | ICD-10-CM

## 2014-11-14 ENCOUNTER — Other Ambulatory Visit: Payer: Self-pay | Admitting: Internal Medicine

## 2014-11-15 ENCOUNTER — Other Ambulatory Visit: Payer: Self-pay | Admitting: Internal Medicine

## 2014-12-07 ENCOUNTER — Ambulatory Visit (INDEPENDENT_AMBULATORY_CARE_PROVIDER_SITE_OTHER): Payer: Medicare Other | Admitting: Surgery

## 2014-12-07 DIAGNOSIS — I4891 Unspecified atrial fibrillation: Secondary | ICD-10-CM | POA: Diagnosis not present

## 2014-12-07 DIAGNOSIS — Z5181 Encounter for therapeutic drug level monitoring: Secondary | ICD-10-CM | POA: Diagnosis not present

## 2014-12-07 DIAGNOSIS — I482 Chronic atrial fibrillation, unspecified: Secondary | ICD-10-CM

## 2014-12-07 DIAGNOSIS — Z7901 Long term (current) use of anticoagulants: Secondary | ICD-10-CM | POA: Diagnosis not present

## 2014-12-07 LAB — POCT INR: INR: 3.6

## 2014-12-19 ENCOUNTER — Other Ambulatory Visit: Payer: Self-pay | Admitting: Internal Medicine

## 2014-12-21 ENCOUNTER — Ambulatory Visit (INDEPENDENT_AMBULATORY_CARE_PROVIDER_SITE_OTHER): Payer: Medicare Other | Admitting: *Deleted

## 2014-12-21 DIAGNOSIS — I4891 Unspecified atrial fibrillation: Secondary | ICD-10-CM

## 2014-12-21 DIAGNOSIS — I482 Chronic atrial fibrillation, unspecified: Secondary | ICD-10-CM

## 2014-12-21 DIAGNOSIS — Z5181 Encounter for therapeutic drug level monitoring: Secondary | ICD-10-CM | POA: Diagnosis not present

## 2014-12-21 DIAGNOSIS — Z7901 Long term (current) use of anticoagulants: Secondary | ICD-10-CM | POA: Diagnosis not present

## 2014-12-21 LAB — POCT INR: INR: 3.1

## 2014-12-22 ENCOUNTER — Other Ambulatory Visit (INDEPENDENT_AMBULATORY_CARE_PROVIDER_SITE_OTHER): Payer: Medicare Other

## 2014-12-22 DIAGNOSIS — D649 Anemia, unspecified: Secondary | ICD-10-CM

## 2014-12-22 DIAGNOSIS — R739 Hyperglycemia, unspecified: Secondary | ICD-10-CM

## 2014-12-22 DIAGNOSIS — D696 Thrombocytopenia, unspecified: Secondary | ICD-10-CM

## 2014-12-22 LAB — IRON AND TIBC
%SAT: 11 % — ABNORMAL LOW (ref 20–55)
Iron: 19 ug/dL — ABNORMAL LOW (ref 42–165)
TIBC: 169 ug/dL — ABNORMAL LOW (ref 215–435)
UIBC: 150 ug/dL (ref 125–400)

## 2014-12-22 LAB — CBC WITH DIFFERENTIAL/PLATELET
Basophils Absolute: 0 10*3/uL (ref 0.0–0.1)
Basophils Relative: 0.4 % (ref 0.0–3.0)
Eosinophils Absolute: 0.4 10*3/uL (ref 0.0–0.7)
Eosinophils Relative: 3.4 % (ref 0.0–5.0)
HCT: 35.8 % — ABNORMAL LOW (ref 39.0–52.0)
Hemoglobin: 11.6 g/dL — ABNORMAL LOW (ref 13.0–17.0)
LYMPHS ABS: 1.3 10*3/uL (ref 0.7–4.0)
Lymphocytes Relative: 11.8 % — ABNORMAL LOW (ref 12.0–46.0)
MCHC: 32.3 g/dL (ref 30.0–36.0)
MCV: 79.2 fl (ref 78.0–100.0)
MONO ABS: 0.9 10*3/uL (ref 0.1–1.0)
Monocytes Relative: 7.6 % (ref 3.0–12.0)
NEUTROS ABS: 8.8 10*3/uL — AB (ref 1.4–7.7)
NEUTROS PCT: 76.8 % (ref 43.0–77.0)
Platelets: 236 10*3/uL (ref 150.0–400.0)
RBC: 4.52 Mil/uL (ref 4.22–5.81)
RDW: 17.9 % — ABNORMAL HIGH (ref 11.5–15.5)
WBC: 11.4 10*3/uL — ABNORMAL HIGH (ref 4.0–10.5)

## 2014-12-22 LAB — BASIC METABOLIC PANEL
BUN: 20 mg/dL (ref 6–23)
CO2: 26 mEq/L (ref 19–32)
Calcium: 9.7 mg/dL (ref 8.4–10.5)
Chloride: 100 mEq/L (ref 96–112)
Creatinine, Ser: 1 mg/dL (ref 0.40–1.50)
GFR: 74.74 mL/min (ref >60.00)
Glucose, Bld: 178 mg/dL — ABNORMAL HIGH (ref 70–99)
Potassium: 4.4 mEq/L (ref 3.5–5.1)
Sodium: 134 mEq/L — ABNORMAL LOW (ref 135–145)

## 2014-12-22 LAB — HEMOGLOBIN A1C: Hgb A1c MFr Bld: 6.4 % (ref 4.6–6.5)

## 2014-12-23 ENCOUNTER — Other Ambulatory Visit: Payer: Self-pay | Admitting: Internal Medicine

## 2014-12-23 MED ORDER — FERROUS SULFATE 325 (65 FE) MG PO TABS: 325.0000 mg | ORAL_TABLET | Freq: Every day | ORAL | Status: AC

## 2014-12-24 ENCOUNTER — Telehealth: Payer: Self-pay | Admitting: Internal Medicine

## 2014-12-24 NOTE — Telephone Encounter (Signed)
Spoke to pt. Stool cards mailed to pt. Labs mailed to pt and pt informed of labs.

## 2014-12-24 NOTE — Telephone Encounter (Signed)
Please call patient regarding lab work

## 2014-12-28 ENCOUNTER — Telehealth: Payer: Self-pay | Admitting: Internal Medicine

## 2014-12-28 DIAGNOSIS — D509 Iron deficiency anemia, unspecified: Secondary | ICD-10-CM

## 2014-12-28 NOTE — Telephone Encounter (Signed)
Pt informed of below. Hemoccult cards upfront for p/u.  Notes Recorded by Cassandria Anger, MD on 12/23/2014 at 7:57 AM Erline Levine, please, inform patient that all labs are normal except for anemia and low iron.  Pls order hemoccult stool cards. Collect tests. Then, start iron tabs 325 mg one a day. Thx

## 2014-12-28 NOTE — Telephone Encounter (Signed)
Patient is requesting call back in regards to labs.

## 2015-01-03 ENCOUNTER — Ambulatory Visit (INDEPENDENT_AMBULATORY_CARE_PROVIDER_SITE_OTHER): Payer: Medicare Other

## 2015-01-03 DIAGNOSIS — Z7901 Long term (current) use of anticoagulants: Secondary | ICD-10-CM | POA: Diagnosis not present

## 2015-01-03 DIAGNOSIS — I482 Chronic atrial fibrillation, unspecified: Secondary | ICD-10-CM

## 2015-01-03 DIAGNOSIS — Z5181 Encounter for therapeutic drug level monitoring: Secondary | ICD-10-CM

## 2015-01-03 DIAGNOSIS — I4891 Unspecified atrial fibrillation: Secondary | ICD-10-CM | POA: Diagnosis not present

## 2015-01-03 LAB — POCT INR: INR: 2.3

## 2015-01-05 ENCOUNTER — Other Ambulatory Visit (INDEPENDENT_AMBULATORY_CARE_PROVIDER_SITE_OTHER): Payer: Medicare Other

## 2015-01-05 DIAGNOSIS — D509 Iron deficiency anemia, unspecified: Secondary | ICD-10-CM

## 2015-01-05 LAB — HEMOCCULT SLIDES (X 3 CARDS)
Fecal Occult Blood: NEGATIVE
OCCULT 1: NEGATIVE
OCCULT 2: NEGATIVE
OCCULT 3: NEGATIVE
OCCULT 4: NEGATIVE
OCCULT 5: NEGATIVE

## 2015-01-06 ENCOUNTER — Telehealth: Payer: Self-pay | Admitting: Internal Medicine

## 2015-01-06 ENCOUNTER — Other Ambulatory Visit: Payer: Self-pay | Admitting: *Deleted

## 2015-01-06 MED ORDER — METOPROLOL SUCCINATE ER 50 MG PO TB24: 25.0000 mg | ORAL_TABLET | Freq: Two times a day (BID) | ORAL | Status: DC

## 2015-01-06 NOTE — Telephone Encounter (Signed)
Error

## 2015-01-06 NOTE — Telephone Encounter (Signed)
error 

## 2015-01-07 ENCOUNTER — Other Ambulatory Visit: Payer: Self-pay

## 2015-01-07 MED ORDER — METOPROLOL SUCCINATE ER 50 MG PO TB24: 25.0000 mg | ORAL_TABLET | Freq: Two times a day (BID) | ORAL | Status: DC

## 2015-01-07 MED ORDER — METOPROLOL SUCCINATE ER 50 MG PO TB24: 25.0000 mg | ORAL_TABLET | Freq: Two times a day (BID) | ORAL | Status: AC

## 2015-01-24 ENCOUNTER — Ambulatory Visit (INDEPENDENT_AMBULATORY_CARE_PROVIDER_SITE_OTHER): Payer: Medicare Other

## 2015-01-24 DIAGNOSIS — I482 Chronic atrial fibrillation, unspecified: Secondary | ICD-10-CM

## 2015-01-24 DIAGNOSIS — I4891 Unspecified atrial fibrillation: Secondary | ICD-10-CM

## 2015-01-24 DIAGNOSIS — Z7901 Long term (current) use of anticoagulants: Secondary | ICD-10-CM

## 2015-01-24 DIAGNOSIS — Z5181 Encounter for therapeutic drug level monitoring: Secondary | ICD-10-CM | POA: Diagnosis not present

## 2015-01-24 LAB — POCT INR: INR: 3.1

## 2015-02-12 ENCOUNTER — Other Ambulatory Visit: Payer: Self-pay | Admitting: Internal Medicine

## 2015-02-13 ENCOUNTER — Other Ambulatory Visit: Payer: Self-pay | Admitting: Internal Medicine

## 2015-02-14 ENCOUNTER — Ambulatory Visit (INDEPENDENT_AMBULATORY_CARE_PROVIDER_SITE_OTHER): Payer: Medicare Other | Admitting: *Deleted

## 2015-02-14 DIAGNOSIS — Z7901 Long term (current) use of anticoagulants: Secondary | ICD-10-CM | POA: Diagnosis not present

## 2015-02-14 DIAGNOSIS — I482 Chronic atrial fibrillation, unspecified: Secondary | ICD-10-CM

## 2015-02-14 DIAGNOSIS — Z5181 Encounter for therapeutic drug level monitoring: Secondary | ICD-10-CM | POA: Diagnosis not present

## 2015-02-14 DIAGNOSIS — I4891 Unspecified atrial fibrillation: Secondary | ICD-10-CM

## 2015-02-14 LAB — POCT INR: INR: 3

## 2015-03-15 ENCOUNTER — Ambulatory Visit (INDEPENDENT_AMBULATORY_CARE_PROVIDER_SITE_OTHER): Payer: Medicare Other

## 2015-03-15 DIAGNOSIS — I482 Chronic atrial fibrillation, unspecified: Secondary | ICD-10-CM

## 2015-03-15 DIAGNOSIS — I4891 Unspecified atrial fibrillation: Secondary | ICD-10-CM | POA: Diagnosis not present

## 2015-03-15 DIAGNOSIS — Z7901 Long term (current) use of anticoagulants: Secondary | ICD-10-CM | POA: Diagnosis not present

## 2015-03-15 DIAGNOSIS — Z5181 Encounter for therapeutic drug level monitoring: Secondary | ICD-10-CM | POA: Diagnosis not present

## 2015-03-15 LAB — POCT INR: INR: 3.3

## 2015-03-17 ENCOUNTER — Encounter: Payer: Self-pay | Admitting: Internal Medicine

## 2015-03-17 ENCOUNTER — Other Ambulatory Visit (INDEPENDENT_AMBULATORY_CARE_PROVIDER_SITE_OTHER): Payer: Medicare Other

## 2015-03-17 ENCOUNTER — Ambulatory Visit (INDEPENDENT_AMBULATORY_CARE_PROVIDER_SITE_OTHER)
Admission: RE | Admit: 2015-03-17 | Discharge: 2015-03-17 | Disposition: A | Discharge status: Home / Self Care | Payer: Medicare Other | Source: Ambulatory Visit | Attending: Internal Medicine | Admitting: Internal Medicine

## 2015-03-17 ENCOUNTER — Ambulatory Visit (INDEPENDENT_AMBULATORY_CARE_PROVIDER_SITE_OTHER): Payer: Medicare Other | Admitting: Internal Medicine

## 2015-03-17 VITALS — BP 118/68 | HR 111 | Temp 97.8°F | Ht 71.0 in | Wt 180.0 lb

## 2015-03-17 DIAGNOSIS — R634 Abnormal weight loss: Secondary | ICD-10-CM

## 2015-03-17 DIAGNOSIS — I482 Chronic atrial fibrillation, unspecified: Secondary | ICD-10-CM

## 2015-03-17 DIAGNOSIS — R61 Generalized hyperhidrosis: Secondary | ICD-10-CM | POA: Insufficient documentation

## 2015-03-17 DIAGNOSIS — I10 Essential (primary) hypertension: Secondary | ICD-10-CM | POA: Diagnosis not present

## 2015-03-17 DIAGNOSIS — I2510 Atherosclerotic heart disease of native coronary artery without angina pectoris: Secondary | ICD-10-CM

## 2015-03-17 NOTE — Progress Notes (Signed)
Subjective:    Patient ID: John Halt., male    DOB: 07/20/1924, 81 y.o.   MRN: [id number]  HPI  Here to f/u, Dr AVP not available today with family, has significant wt loss and night sweats, up to 2 months, etiology simply not clear, no overt hx of anything more specific in last few days, but has had less po intake last few days as well. Family states as had anemia recently, no UTI per recent evaluation with Dr Jeffie Pollock, otherwise has also been a bit more staggery in last few days as well.    Wt Readings from Last 3 Encounters:  03/17/15 180 lb (81.647 kg)  11/08/14 199 lb (90.266 kg)  05/27/14 207 lb (93.895 kg)  Has had some worse constipation on iron, but o/w pt denies all questions -  Pt denies chest pain, increased sob or doe, wheezing, orthopnea, PND, increased LE swelling, palpitations, dizziness or syncope.  Pt denies new neurological symptoms such as new headache, or facial or extremity weakness or numbness   Pt denies polydipsia, polyuria, and Denies urinary symptoms such as dysuria, frequency, urgency, flank pain, hematuria or n/v, fever, chills.  Denies worsening depressive symptoms, suicidal ideation, or panic.  No recent med changes, no hx of malignancy.  Of note, pt did stop his lasix over 1 wk ago as he thought he might be getting low on volume;  On coumadin with good compliance - no overt bleeding or bruising Past Medical History  Diagnosis Date  . Anemia     low blood platelets  . Arthritis   . Blood transfusion   . Cancer     skin CA  . Cataract   . Heart murmur   . Hyperlipidemia   . Hypertension   . TIA (transient ischemic attack)   . Atrial fibrillation     on Coumadin  . Myocardial infarction     after colon surgery   Past Surgical History  Procedure Laterality Date  . Colon surgery      partial colectomy  . Total hip arthroplasty      LEFT HIP  . Coronary artery bypass graft      3 vessels  . Heart stent    . Thoracotomy    . Lens implants    .  Colon surgery      right    reports that he quit smoking about 49 years ago. He does not have any smokeless tobacco history on file. He reports that he drinks alcohol. His drug history is not on file. family history includes Colon polyps in his mother; Stroke in his father and other. Allergies  Allergen Reactions  . Polysporin [Bacitracin-Polymyxin B] Other (See Comments)    Doesn't remember reaction   Current Outpatient Prescriptions on File Prior to Visit  Medication Sig Dispense Refill  . amLODipine (NORVASC) 2.5 MG tablet TAKE 1 TABLET TWICE A DAY 180 tablet 2  . Cholecalciferol (VITAMIN D) 1000 UNITS capsule Take 1,000 Units by mouth daily.      . Diphenhydramine-APAP, sleep, (TYLENOL PM EXTRA STRENGTH PO) Take 1 capsule by mouth at bedtime as needed.    . ferrous sulfate 325 (65 FE) MG tablet Take 1 tablet (325 mg total) by mouth daily with breakfast. 30 tablet 3  . furosemide (LASIX) 20 MG tablet TAKE 1 TO 2 TABLETS DAILY AS NEEDED FOR EDEMA 180 tablet 3  . metoprolol succinate (TOPROL-XL) 50 MG 24 hr tablet Take 1 tablet (50 mg total)  by mouth 2 (two) times daily. Take with or immediately following a meal. 90 tablet 0  . Multiple Vitamins-Minerals (ICAPS MV) TABS Take 1 tablet by mouth daily.      Marland Kitchen omeprazole (PRILOSEC) 40 MG capsule TAKE 1 CAPSULE DAILY 90 capsule 3  . polyethylene glycol powder (GLYCOLAX/MIRALAX) powder Take 17 g by mouth as needed. 527 g 6  . pravastatin (PRAVACHOL) 40 MG tablet TAKE 1 TABLET DAILY 90 tablet 0  . triamcinolone cream (KENALOG) 0.1 % Apply topically 3 (three) times daily. Apply as directed 45 g 2  . warfarin (COUMADIN) 2.5 MG tablet TAKE AS DIRECTED BY COUMADIN CLINIC 105 tablet 1  . Tamsulosin HCl (FLOMAX) 0.4 MG CAPS daily.      No current facility-administered medications on file prior to visit.   Review of Systems  Constitutional: Negative for unusual diaphoresis other than above HENT: Negative for ringing in ear or discharge Eyes:  Negative for double vision or worsening visual disturbance.  Respiratory: Negative for choking and stridor.   Gastrointestinal: Negative for vomiting or other signifcant bowel change Genitourinary: Negative for hematuria or change in urine volume.  Musculoskeletal: Negative for other MSK pain or swelling Skin: Negative for color change and worsening wound.  Neurological: Negative for tremors and numbness other than noted  Psychiatric/Behavioral: Negative for decreased concentration or agitation other than above       Objective:   Physical Exam BP 118/68 mmHg  Pulse 111  Temp(Src) 97.8 F (36.6 C) (Oral)  Ht 5\' 11"  (1.803 m)  Wt 180 lb (81.647 kg)  BMI 25.12 kg/m2  SpO2 96% + orthostatic - see nurse documentation VS noted, fatigued and generally weak, not ill appearing Constitutional: Pt appears in no significant distress HENT: Head: NCAT.  Right Ear: External ear normal.  Left Ear: External ear normal.  Eyes: . Pupils are equal, round, and reactive to light. Conjunctivae and EOM are normal Neck: Normal range of motion. Neck supple.  Cardiovascular: mild elevated rate and irregular rhythm.   Pulmonary/Chest: Effort normal and breath sounds without rales or wheezing.  Abd:  Soft, NT, ND, + BS Neurological: Pt is alert. Not confused , motor grossly intact Skin: Skin is warm. No rash, no LE edema Psychiatric: Pt behavior is normal. No agitation.     Assessment & Plan:

## 2015-03-17 NOTE — Assessment & Plan Note (Addendum)
Mild elevated today, likely be volume related with decr po intake, cont same meds including BB , refer to Dr Lovena Le, no evidence for volume overload today  Note:  Total time for pt hx, exam, review of record with pt in the room, determination of diagnoses and plan for further eval and tx is > 40 min, with over 50% spent in coordination and counseling of patient

## 2015-03-17 NOTE — Patient Instructions (Addendum)
Your blood pressure did fall with standing today, so we need to hold on taking the lasix until you are evaluated again  Your heart rate is very mildly elevated today, which is likely a reaction to mild dehydration  Please continue all other medications as before  Please have the pharmacy call with any other refills you may need.  Please continue your efforts at being more active, low cholesterol diet, and weight control.  You will be contacted regarding the referral for: Cardiology - Dr Lovena Le  Please keep your appointments with your specialists as you may have planned  Please go to the XRAY Department in the Basement (go straight as you get off the elevator) for the x-ray testing  Please go to the LAB in the Basement (turn left off the elevator) for the tests to be done today  You will be contacted by phone if any changes need to be made immediately.  Otherwise, you will receive a letter about your results with an explanation, but please check with MyChart first.  Please remember to sign up for MyChart if you have not done so, as this will be important to you in the future with finding out test results, communicating by private email, and scheduling acute appointments online when needed.  Please see Dr Alain Marion in 1-2 weeks, as you may need further evaluation such as EGD (stomach scope) or CT abdomen/pelvis

## 2015-03-17 NOTE — Progress Notes (Signed)
Pre visit review using our clinic review tool, if applicable. No additional management support is needed unless otherwise documented below in the visit note. 

## 2015-03-18 ENCOUNTER — Encounter: Payer: Self-pay | Admitting: Internal Medicine

## 2015-03-18 LAB — CBC WITH DIFFERENTIAL/PLATELET
BASOS PCT: 2.3 % (ref 0.0–3.0)
Basophils Absolute: 0.4 10*3/uL — ABNORMAL HIGH (ref 0.0–0.1)
EOS PCT: 2 % (ref 0.0–5.0)
Eosinophils Absolute: 0.3 10*3/uL (ref 0.0–0.7)
HEMATOCRIT: 34.9 % — AB (ref 39.0–52.0)
HEMOGLOBIN: 10.9 g/dL — AB (ref 13.0–17.0)
Lymphs Abs: 1.2 10*3/uL (ref 0.7–4.0)
MCHC: 31.2 g/dL (ref 30.0–36.0)
MCV: 80.9 fl (ref 78.0–100.0)
MONOS PCT: 8.2 % (ref 3.0–12.0)
Monocytes Absolute: 1.4 10*3/uL — ABNORMAL HIGH (ref 0.1–1.0)
Neutro Abs: 13.2 10*3/uL — ABNORMAL HIGH (ref 1.4–7.7)
Neutrophils Relative %: 80.1 % — ABNORMAL HIGH (ref 43.0–77.0)
Platelets: 327 10*3/uL (ref 150.0–400.0)
RBC: 4.31 Mil/uL (ref 4.22–5.81)
RDW: 17.1 % — AB (ref 11.5–15.5)
WBC: 16.5 10*3/uL — AB (ref 4.0–10.5)

## 2015-03-18 LAB — HEPATIC FUNCTION PANEL
ALT: 24 U/L (ref 0–53)
AST: 71 U/L — AB (ref 0–37)
Albumin: 2.9 g/dL — ABNORMAL LOW (ref 3.5–5.2)
Alkaline Phosphatase: 187 U/L — ABNORMAL HIGH (ref 39–117)
BILIRUBIN TOTAL: 0.6 mg/dL (ref 0.2–1.2)
Bilirubin, Direct: 0.2 mg/dL (ref 0.0–0.3)
Total Protein: 8.1 g/dL (ref 6.0–8.3)

## 2015-03-18 LAB — BASIC METABOLIC PANEL
BUN: 25 mg/dL — AB (ref 6–23)
CALCIUM: 10.3 mg/dL (ref 8.4–10.5)
CO2: 28 mEq/L (ref 19–32)
Chloride: 100 mEq/L (ref 96–112)
Creatinine, Ser: 0.94 mg/dL (ref 0.40–1.50)
GFR: 80.23 mL/min (ref >60.00)
Glucose, Bld: 133 mg/dL — ABNORMAL HIGH (ref 70–99)
POTASSIUM: 4.9 meq/L (ref 3.5–5.1)
Sodium: 137 mEq/L (ref 135–145)

## 2015-03-18 LAB — URINALYSIS, ROUTINE W REFLEX MICROSCOPIC
BILIRUBIN URINE: NEGATIVE
KETONES UR: NEGATIVE
LEUKOCYTES UA: NEGATIVE
NITRITE: NEGATIVE
Specific Gravity, Urine: 1.02 (ref 1.000–1.030)
Total Protein, Urine: NEGATIVE
Urine Glucose: NEGATIVE
Urobilinogen, UA: 4 — AB (ref 0.0–1.0)
pH: 5 (ref 5.0–8.0)

## 2015-03-18 LAB — TSH: TSH: 1.51 u[IU]/mL (ref 0.35–4.50)

## 2015-03-18 LAB — SEDIMENTATION RATE: Sed Rate: 113 mm/hr — ABNORMAL HIGH (ref 0–22)

## 2015-03-19 NOTE — Assessment & Plan Note (Signed)
To stay off lasix for now, f/u Dr Alain Marion

## 2015-03-19 NOTE — Assessment & Plan Note (Signed)
For cxr today, labs as ordered, and consider possibility of underlying malignancy, consider ct abd/pelvis and/or GI referral - ? EGD

## 2015-03-19 NOTE — Assessment & Plan Note (Signed)
With some low volume today, but also other wt loss in last 2 mo by hx, consider malignancy as above, also for SPEP today

## 2015-03-21 ENCOUNTER — Telehealth: Payer: Self-pay | Admitting: *Deleted

## 2015-03-21 LAB — PROTEIN ELECTROPHORESIS, SERUM
ALPHA-2-GLOBULIN: 1.1 g/dL — AB (ref 0.5–0.9)
Albumin ELP: 2.2 g/dL — ABNORMAL LOW (ref 3.8–4.8)
Alpha-1-Globulin: 0.8 g/dL — ABNORMAL HIGH (ref 0.2–0.3)
Beta 2: 1.6 g/dL — ABNORMAL HIGH (ref 0.2–0.5)
Beta Globulin: 0.4 g/dL (ref 0.4–0.6)
GAMMA GLOBULIN: 1.7 g/dL (ref 0.8–1.7)
Total Protein, Serum Electrophoresis: 7.9 g/dL (ref 6.1–8.1)

## 2015-03-21 NOTE — Telephone Encounter (Signed)
Pt has been set up to see Dr. Alain Marion on 03/23/15 he is aware of labs...John [name]

## 2015-03-21 NOTE — Telephone Encounter (Signed)
-----   Message from Biagio Borg, MD sent at 03/19/2015  4:04 PM EDT ----- Regarding: abnormal labs, afib Lucy, see lab notes  Pt needs to see Dr Quintella Baton 8/22 if possible for repeat exam , ? Some kind of infection? Thanks Dr Jenny Reichmann

## 2015-03-22 ENCOUNTER — Ambulatory Visit (INDEPENDENT_AMBULATORY_CARE_PROVIDER_SITE_OTHER): Payer: Medicare Other | Admitting: Internal Medicine

## 2015-03-22 ENCOUNTER — Encounter: Payer: Self-pay | Admitting: Internal Medicine

## 2015-03-22 ENCOUNTER — Telehealth: Payer: Self-pay | Admitting: *Deleted

## 2015-03-22 VITALS — BP 100/64 | HR 98 | Ht 72.0 in | Wt 179.4 lb

## 2015-03-22 DIAGNOSIS — I482 Chronic atrial fibrillation, unspecified: Secondary | ICD-10-CM

## 2015-03-22 DIAGNOSIS — I2510 Atherosclerotic heart disease of native coronary artery without angina pectoris: Secondary | ICD-10-CM | POA: Diagnosis not present

## 2015-03-22 DIAGNOSIS — I739 Peripheral vascular disease, unspecified: Secondary | ICD-10-CM | POA: Diagnosis not present

## 2015-03-22 DIAGNOSIS — R61 Generalized hyperhidrosis: Secondary | ICD-10-CM

## 2015-03-22 DIAGNOSIS — I10 Essential (primary) hypertension: Secondary | ICD-10-CM

## 2015-03-22 NOTE — Patient Instructions (Signed)
Medication Instructions:  Your physician recommends that you continue on your current medications as directed. Please refer to the Current Medication list given to you today.   Labwork: None ordered  Testing/Procedures: None ordered  Follow-Up: Your physician wants you to follow-up in: [place].   You will receive a reminder letter in the mail two months in advance. If you don't receive a letter, please call our office to schedule the follow-up appointment.   Any Other Special Instructions Will Be Listed Below (If Applicable).

## 2015-03-22 NOTE — Progress Notes (Signed)
HPI [name] returns today for followup. He is a very pleasant 81 year old man with chronic atrial fibrillation and hypertension, who is been well-controlled on medical therapy. He denies chest pain or syncope. He is fairly sedentary. His main problem is arthritis although he has noted to be more tired and fatigued lately. He has been seen by his primary MD and I have reviewed his labs. His ESR and WBC are elevated. He notes that he has lost weight.  Allergies  Allergen Reactions  . Polysporin [Bacitracin-Polymyxin B] Other (See Comments)    Doesn't remember reaction     Current Outpatient Prescriptions  Medication Sig Dispense Refill  . amLODipine (NORVASC) 2.5 MG tablet Take 2.5 mg by mouth 2 (two) times daily.    . Cholecalciferol (VITAMIN D) 1000 UNITS capsule Take 1,000 Units by mouth daily.      . Diphenhydramine-APAP, sleep, (TYLENOL PM EXTRA STRENGTH PO) Take 1 capsule by mouth at bedtime as needed (sleep).     . ferrous sulfate 325 (65 FE) MG tablet Take 1 tablet (325 mg total) by mouth daily with breakfast. 30 tablet 3  . metoprolol succinate (TOPROL-XL) 50 MG 24 hr tablet Take 1 tablet (50 mg total) by mouth 2 (two) times daily. Take with or immediately following a meal. 90 tablet 0  . omeprazole (PRILOSEC) 40 MG capsule Take 40 mg by mouth daily.    . polyethylene glycol (MIRALAX / GLYCOLAX) packet Take 17 g by mouth daily as needed (constipation).    . pravastatin (PRAVACHOL) 40 MG tablet Take 40 mg by mouth daily.    . silodosin (RAPAFLO) 4 MG CAPS capsule Take 4 mg by mouth daily.    Marland Kitchen warfarin (COUMADIN) 2.5 MG tablet TAKE AS DIRECTED BY COUMADIN CLINIC 105 tablet 1   No current facility-administered medications for this visit.     Past Medical History  Diagnosis Date  . Anemia     low blood platelets  . Arthritis   . Blood transfusion   . Cancer     skin CA  . Cataract   . Heart murmur   . Hyperlipidemia   . Hypertension   . TIA (transient ischemic attack)    . Atrial fibrillation     on Coumadin  . Myocardial infarction     after colon surgery    ROS:   All systems reviewed and negative except as noted in the HPI.   Past Surgical History  Procedure Laterality Date  . Colon surgery      partial colectomy  . Total hip arthroplasty      LEFT HIP  . Coronary artery bypass graft      3 vessels  . Heart stent    . Thoracotomy    . Lens implants    . Colon surgery      right     Family History  Problem Relation Age of Onset  . Colon polyps Mother   . Stroke Father   . Stroke Other      Social History   Social History  . Marital Status: Widowed    Spouse Name: John [name]  . Number of Children: N/A  . Years of Education: N/A   Occupational History  .     Social History Main Topics  . Smoking status: Former Smoker    Quit date: 10/28/1965  . Smokeless tobacco: Not on file  . Alcohol Use: 0.0 oz/week  . Drug Use: Not on file  . Sexual Activity: Yes  Other Topics Concern  . Not on file   Social History Narrative     BP 100/64 mmHg  Pulse 98  Ht 6' (1.829 m)  Wt 179 lb 6.4 oz (81.375 kg)  BMI 24.33 kg/m2  Physical Exam:  stable appearing elderly man, NAD HEENT: Unremarkable Neck:  6 cm JVD, no thyromegally Lungs:  Clear with no wheezes, rales, or rhonchi. HEART:  IRegular rate rhythm, no murmurs, no rubs, no clicks Abd:  soft, positive bowel sounds, no organomegally, no rebound, no guarding Ext:  2 plus pulses, trace edema in left lower extremity, no cyanosis, no clubbing Skin:  No rashes no nodules Neuro:  CN II through XII intact, motor grossly intact  EKG Atrial fibrillation with a controlled ventricular response  Assess/Plan:

## 2015-03-22 NOTE — Telephone Encounter (Signed)
Called daughter Santiago Glad) to check up on dad since he haven't seen Dr. Alain Marion yet. Daughter states he is doing ok he actually has appt today to see Dr. Lovena Le @ cardiology @ 10:00. Daughter denies that he has cough, SOB, or fever sxs. Will keep f/u appt with Dr. Alain Marion tomorrow @ 10:30...John [name]

## 2015-03-23 ENCOUNTER — Encounter: Payer: Self-pay | Admitting: Internal Medicine

## 2015-03-23 ENCOUNTER — Ambulatory Visit (INDEPENDENT_AMBULATORY_CARE_PROVIDER_SITE_OTHER): Payer: Medicare Other | Admitting: Internal Medicine

## 2015-03-23 ENCOUNTER — Ambulatory Visit (INDEPENDENT_AMBULATORY_CARE_PROVIDER_SITE_OTHER)
Admission: RE | Admit: 2015-03-23 | Discharge: 2015-03-23 | Disposition: A | Discharge status: Home / Self Care | Payer: Medicare Other | Source: Ambulatory Visit | Attending: Internal Medicine | Admitting: Internal Medicine

## 2015-03-23 VITALS — BP 100/60 | HR 112 | Temp 98.2°F | Wt 179.0 lb

## 2015-03-23 DIAGNOSIS — I482 Chronic atrial fibrillation, unspecified: Secondary | ICD-10-CM

## 2015-03-23 DIAGNOSIS — I10 Essential (primary) hypertension: Secondary | ICD-10-CM | POA: Diagnosis not present

## 2015-03-23 DIAGNOSIS — R7989 Other specified abnormal findings of blood chemistry: Secondary | ICD-10-CM

## 2015-03-23 DIAGNOSIS — R634 Abnormal weight loss: Secondary | ICD-10-CM

## 2015-03-23 DIAGNOSIS — I2510 Atherosclerotic heart disease of native coronary artery without angina pectoris: Secondary | ICD-10-CM

## 2015-03-23 DIAGNOSIS — R945 Abnormal results of liver function studies: Secondary | ICD-10-CM

## 2015-03-23 NOTE — Progress Notes (Signed)
Subjective:  Patient ID: John Halt., male    DOB: 1923-09-21  Age: 82 y.o. MRN: [id number]  CC: No chief complaint on file.   HPI John Halt. presents for tremors, weakness, incontinence, wt loss of 20 lbs, no appetite x2-3 months. Pt saw Dr Lovena Le, Dr Jeffie Pollock, Dr Jenny Reichmann  Outpatient Prescriptions Prior to Visit  Medication Sig Dispense Refill  . amLODipine (NORVASC) 2.5 MG tablet Take 2.5 mg by mouth daily.     . Cholecalciferol (VITAMIN D) 1000 UNITS capsule Take 1,000 Units by mouth daily.      . Diphenhydramine-APAP, sleep, (TYLENOL PM EXTRA STRENGTH PO) Take 1 capsule by mouth at bedtime as needed (sleep).     . ferrous sulfate 325 (65 FE) MG tablet Take 1 tablet (325 mg total) by mouth daily with breakfast. 30 tablet 3  . metoprolol succinate (TOPROL-XL) 50 MG 24 hr tablet Take 1 tablet (50 mg total) by mouth 2 (two) times daily. Take with or immediately following a meal. (Patient taking differently: Take 25 mg by mouth daily. Take with or immediately following a meal.) 90 tablet 0  . omeprazole (PRILOSEC) 40 MG capsule Take 40 mg by mouth daily.    . polyethylene glycol (MIRALAX / GLYCOLAX) packet Take 17 g by mouth daily as needed (constipation).    . pravastatin (PRAVACHOL) 40 MG tablet Take 40 mg by mouth daily.    . silodosin (RAPAFLO) 4 MG CAPS capsule Take 4 mg by mouth daily.    Marland Kitchen warfarin (COUMADIN) 2.5 MG tablet TAKE AS DIRECTED BY COUMADIN CLINIC 105 tablet 1   No facility-administered medications prior to visit.    ROS Review of Systems  Constitutional: Positive for activity change and fatigue. Negative for chills, appetite change and unexpected weight change.  HENT: Negative for congestion, nosebleeds, sinus pressure, sneezing, sore throat and trouble swallowing.   Eyes: Negative for photophobia, itching and visual disturbance.  Respiratory: Positive for shortness of breath. Negative for apnea, cough and chest tightness.   Cardiovascular: Negative for  chest pain, palpitations and leg swelling.  Gastrointestinal: Positive for constipation. Negative for nausea, diarrhea, blood in stool and abdominal distention.  Endocrine: Negative for polydipsia and polyphagia.  Genitourinary: Negative for urgency, frequency, hematuria and flank pain.  Musculoskeletal: Negative for back pain, joint swelling, gait problem and neck pain.  Skin: Negative for rash.  Neurological: Positive for dizziness and weakness. Negative for tremors and speech difficulty.  Psychiatric/Behavioral: Negative for sleep disturbance, dysphoric mood and agitation. The patient is not nervous/anxious.     Objective:  BP 100/60 mmHg  Pulse 112  Temp(Src) 98.2 F (36.8 C) (Oral)  Wt 179 lb (81.194 kg)  SpO2 97%  BP Readings from Last 3 Encounters:  03/23/15 100/60  03/22/15 100/64  03/17/15 118/68    Wt Readings from Last 3 Encounters:  03/23/15 179 lb (81.194 kg)  03/22/15 179 lb 6.4 oz (81.375 kg)  03/17/15 180 lb (81.647 kg)    Physical Exam  Lab Results  Component Value Date   WBC 16.5* 03/17/2015   HGB 10.9* 03/17/2015   HCT 34.9* 03/17/2015   PLT 327.0 03/17/2015   GLUCOSE 133* 03/17/2015   CHOL 135 05/13/2014   TRIG 100.0 05/13/2014   HDL 20.90* 05/13/2014   LDLCALC 94 05/13/2014   ALT 24 03/17/2015   AST 71* 03/17/2015   NA 137 03/17/2015   K 4.9 03/17/2015   CL 100 03/17/2015   CREATININE 0.94 03/17/2015   BUN 25*  03/17/2015   CO2 28 03/17/2015   TSH 1.51 03/17/2015   PSA 2.77 05/13/2014   INR 3.3 03/15/2015   HGBA1C 6.4 12/22/2014    Dg Chest 2 View  03/18/2015   CLINICAL DATA:  Weakness and weight loss  EXAM: CHEST - 2 VIEW  COMPARISON:  07/16/2008  FINDINGS: Cardiac shadow is at the upper limits of normal in size. Postsurgical changes are noted. Tortuosity of the thoracic aorta is again noted. There also appears to be some aneurysmal dilatation of the ascending aorta. The lungs are clear bilaterally. No acute bony abnormality is noted.   IMPRESSION: Chronic changes in the thoracic aorta.  No acute abnormality noted.   Electronically Signed   By: Inez Catalina M.D.   On: 03/18/2015 08:09    Assessment & Plan:   Diagnoses and all orders for this visit:  Essential hypertension  Chronic atrial fibrillation  Loss of weight -     CT Abdomen Pelvis W Contrast  Elevated LFTs -     CT Abdomen Pelvis W Contrast  I am having John [name] maintain his Vitamin D, (Diphenhydramine-APAP, sleep, (TYLENOL PM EXTRA STRENGTH PO)), ferrous sulfate, metoprolol succinate, warfarin, amLODipine, omeprazole, pravastatin, polyethylene glycol, and silodosin.  No orders of the defined types were placed in this encounter.     Follow-up: Return in about 1 week (around 03/30/2015) for a follow-up visit.  Walker Kehr, MD

## 2015-03-23 NOTE — Progress Notes (Signed)
Pre visit review using our clinic review tool, if applicable. No additional management support is needed unless otherwise documented below in the visit note. 

## 2015-03-23 NOTE — Assessment & Plan Note (Signed)
Rate controlled 

## 2015-03-23 NOTE — Assessment & Plan Note (Signed)
8/16 x3 mo r/o malignancy vs other  CT abd

## 2015-03-23 NOTE — Assessment & Plan Note (Signed)
On Lasix Metoprolol, Amlodipine - all at 1/2 dose now

## 2015-03-24 ENCOUNTER — Telehealth: Payer: Self-pay | Admitting: Internal Medicine

## 2015-03-24 ENCOUNTER — Telehealth: Payer: Self-pay | Admitting: *Deleted

## 2015-03-24 NOTE — Telephone Encounter (Signed)
Opal Sidles called report. Copy of report printed and given to MD for review.

## 2015-03-24 NOTE — Assessment & Plan Note (Signed)
While he did not complain of this problem, the elevated ESR is worrisome for some type of systemic illness. He is pending a visit to his primary MD.

## 2015-03-24 NOTE — Telephone Encounter (Signed)
John [name] from [facility]  Call to give you  A report for patient, please advise [phone]

## 2015-03-24 NOTE — Assessment & Plan Note (Signed)
He has not had claudication. He has become fairly sedentary however.

## 2015-03-24 NOTE — Assessment & Plan Note (Signed)
His blood pressure is well controlled. He will continue his current meds. 

## 2015-03-24 NOTE — Telephone Encounter (Signed)
Per MD pt needs OV on 03/28/15.

## 2015-03-24 NOTE — Telephone Encounter (Signed)
Ov scheduled 03/28/15 @ 3 pm. Pt informed

## 2015-03-24 NOTE — Telephone Encounter (Signed)
Error. See 03/24/15 phone note.

## 2015-03-24 NOTE — Assessment & Plan Note (Signed)
His atrial fibrillation is reasonably well controlled. He will continue his current medical therapy.

## 2015-03-25 ENCOUNTER — Ambulatory Visit (INDEPENDENT_AMBULATORY_CARE_PROVIDER_SITE_OTHER): Payer: Medicare Other | Admitting: Internal Medicine

## 2015-03-25 ENCOUNTER — Encounter: Payer: Self-pay | Admitting: Internal Medicine

## 2015-03-25 VITALS — BP 110/68 | HR 95 | Temp 95.0°F | Ht 73.0 in | Wt 178.0 lb

## 2015-03-25 DIAGNOSIS — R935 Abnormal findings on diagnostic imaging of other abdominal regions, including retroperitoneum: Secondary | ICD-10-CM | POA: Insufficient documentation

## 2015-03-25 DIAGNOSIS — I2510 Atherosclerotic heart disease of native coronary artery without angina pectoris: Secondary | ICD-10-CM | POA: Diagnosis not present

## 2015-03-25 DIAGNOSIS — Z7901 Long term (current) use of anticoagulants: Secondary | ICD-10-CM

## 2015-03-25 NOTE — Progress Notes (Signed)
Subjective:  Patient ID: John [name]., male    DOB: Nov 27, 1923  Age: 81 y.o. MRN: [id number]  CC: No chief complaint on file.   HPI John [name]. presents for CT results revew  Outpatient Prescriptions Prior to Visit  Medication Sig Dispense Refill  . amLODipine (NORVASC) 2.5 MG tablet Take 2.5 mg by mouth daily.     . Cholecalciferol (VITAMIN D) 1000 UNITS capsule Take 1,000 Units by mouth daily.      . Diphenhydramine-APAP, sleep, (TYLENOL PM EXTRA STRENGTH PO) Take 1 capsule by mouth at bedtime as needed (sleep).     . ferrous sulfate 325 (65 FE) MG tablet Take 1 tablet (325 mg total) by mouth daily with breakfast. 30 tablet 3  . metoprolol succinate (TOPROL-XL) 50 MG 24 hr tablet Take 1 tablet (50 mg total) by mouth 2 (two) times daily. Take with or immediately following a meal. (Patient taking differently: Take 25 mg by mouth daily. Take with or immediately following a meal.) 90 tablet 0  . omeprazole (PRILOSEC) 40 MG capsule Take 40 mg by mouth daily.    . polyethylene glycol (MIRALAX / GLYCOLAX) packet Take 17 g by mouth daily as needed (constipation).    . pravastatin (PRAVACHOL) 40 MG tablet Take 40 mg by mouth daily.    . silodosin (RAPAFLO) 4 MG CAPS capsule Take 4 mg by mouth daily.    Marland Kitchen warfarin (COUMADIN) 2.5 MG tablet TAKE AS DIRECTED BY COUMADIN CLINIC 105 tablet 1   No facility-administered medications prior to visit.    ROS Review of Systems  Objective:  BP 110/68 mmHg  Pulse 95  Temp(Src) 95 F (35 C) (Oral)  Ht 6\' 1"  (1.854 m)  Wt 178 lb (80.74 kg)  BMI 23.49 kg/m2  SpO2 97%  BP Readings from Last 3 Encounters:  03/25/15 110/68  03/23/15 100/60  03/22/15 100/64    Wt Readings from Last 3 Encounters:  03/25/15 178 lb (80.74 kg)  03/23/15 179 lb (81.194 kg)  03/22/15 179 lb 6.4 oz (81.375 kg)    Physical Exam  Lab Results  Component Value Date   WBC 16.5* 03/17/2015   HGB 10.9* 03/17/2015   HCT 34.9* 03/17/2015   PLT 327.0  03/17/2015   GLUCOSE 133* 03/17/2015   CHOL 135 05/13/2014   TRIG 100.0 05/13/2014   HDL 20.90* 05/13/2014   LDLCALC 94 05/13/2014   ALT 24 03/17/2015   AST 71* 03/17/2015   NA 137 03/17/2015   K 4.9 03/17/2015   CL 100 03/17/2015   CREATININE 0.94 03/17/2015   BUN 25* 03/17/2015   CO2 28 03/17/2015   TSH 1.51 03/17/2015   PSA 2.77 05/13/2014   INR 3.3 03/15/2015   HGBA1C 6.4 12/22/2014    Ct Abdomen Pelvis W Contrast  03/24/2015   CLINICAL DATA:  Recent weight loss with weakness, history bowel surgery  EXAM: CT ABDOMEN AND PELVIS WITH CONTRAST  TECHNIQUE: Multidetector CT imaging of the abdomen and pelvis was performed using the standard protocol following bolus administration of intravenous contrast.  CONTRAST:  100 mL Omnipaque 300  COMPARISON:  05/12/2004  FINDINGS: Lung bases demonstrate mild right basilar scarring. No focal infiltrate or sizable effusion is seen.  The liver again demonstrates a hypodense lesion within the left lobe consistent with a small cysts. The spleen, gallbladder, adrenal glands and pancreas are within normal limits. The kidneys are well visualized bilaterally and again demonstrate bilateral renal cysts right greater than left. The largest of  these lies within the midportion of the right kidney measuring 5 cm in greatest dimension.  There is new bulky lymphadenopathy identified in the pericaval and periaortic region. The largest of these areas lies interposed between the aorta in the left kidney and measures approximately 4.1 by 4.3 cm. This is best seen on image number 27 of series 2. A 9 mm short axis lymph node is noted adjacent to the distal esophagus best seen on image number 7 of series 2 a large lymph node complex is noted in the portacaval space best seen on image number 22 of series 2. This measures 3.6 x 5.9 cm in greatest dimension.  The gastric wall is diffusely thickened although this is of uncertain significance as nose distension is seen. Postsurgical  changes are noted with partial colonic resection. No obstructive changes are seen. Contrast passes into the colon without difficulty. Aortoiliac calcifications are noted without aneurysmal dilatation.  The bladder is well distended. The prostate is mildly enlarged. No pelvic lymphadenopathy is seen. A large left inguinal hernia is noted with colon within. No incarceration is noted. No significant inguinal lymphadenopathy is noted. The osseous structures show changes of prior left hip replacement and degenerative change of the lumbar spine. No compression deformities are seen.  IMPRESSION: Stable hepatic and renal cysts.  Multiple lymph nodes as described above predominately in the periaortic, pericaval and portacaval space. These are highly suggestive of underlying lymphoma. Some of these in the periaortic and pericaval region would be amenable to percutaneous biopsy.  Diffuse thickening of the gastric wall of uncertain significance as there is no significant distension identified.  These results will be called to the ordering clinician or representative by the Radiologist Assistant, and communication documented in the PACS or zVision Dashboard.   Electronically Signed   By: Inez Catalina M.D.   On: 03/24/2015 07:52   I spent >20 minutes with the patient and more than 50% of time was spent in counseling and coordination of care for Mr Luck's CT findings.  Assessment & Plan:   There are no diagnoses linked to this encounter. I am having Mr. Stabenow maintain his Vitamin D, (Diphenhydramine-APAP, sleep, (TYLENOL PM EXTRA STRENGTH PO)), ferrous sulfate, metoprolol succinate, warfarin, amLODipine, omeprazole, pravastatin, polyethylene glycol, and silodosin.  No orders of the defined types were placed in this encounter.     Follow-up: No Follow-up on file.  Walker Kehr, MD

## 2015-03-25 NOTE — Assessment & Plan Note (Signed)
If we plan to do CT-guided LN bx we will need to do Lovenox bridge per Coumadin clinic

## 2015-03-25 NOTE — Assessment & Plan Note (Addendum)
I spent >20 minutes with the patient and his family and more than 50% of time was spent in counseling and coordination of care for Mr Oatley's CT findings. Options: 1. CT-guided LN bx followed by Hem-Onc consult or 2. Do nothing. I favor #1. Th pt will get in touch with me on Monday.

## 2015-03-25 NOTE — Progress Notes (Signed)
Pre visit review using our clinic review tool, if applicable. No additional management support is needed unless otherwise documented below in the visit note. 

## 2015-03-28 ENCOUNTER — Telehealth: Payer: Self-pay | Admitting: *Deleted

## 2015-03-28 ENCOUNTER — Encounter (HOSPITAL_COMMUNITY): Payer: Self-pay | Admitting: Emergency Medicine

## 2015-03-28 ENCOUNTER — Emergency Department (HOSPITAL_COMMUNITY)
Admission: EM | Admit: 2015-03-28 | Discharge: 2015-03-28 | Disposition: A | Discharge status: Nursing Home (Includes ICF) | Payer: Medicare Other | Attending: Emergency Medicine | Admitting: Emergency Medicine

## 2015-03-28 ENCOUNTER — Ambulatory Visit: Payer: Medicare Other | Admitting: Internal Medicine

## 2015-03-28 DIAGNOSIS — C8590 Non-Hodgkin lymphoma, unspecified, unspecified site: Secondary | ICD-10-CM | POA: Insufficient documentation

## 2015-03-28 DIAGNOSIS — Z8639 Personal history of other endocrine, nutritional and metabolic disease: Secondary | ICD-10-CM | POA: Insufficient documentation

## 2015-03-28 DIAGNOSIS — I4891 Unspecified atrial fibrillation: Secondary | ICD-10-CM | POA: Diagnosis not present

## 2015-03-28 DIAGNOSIS — Z951 Presence of aortocoronary bypass graft: Secondary | ICD-10-CM | POA: Insufficient documentation

## 2015-03-28 DIAGNOSIS — R591 Generalized enlarged lymph nodes: Secondary | ICD-10-CM

## 2015-03-28 DIAGNOSIS — R262 Difficulty in walking, not elsewhere classified: Secondary | ICD-10-CM | POA: Diagnosis not present

## 2015-03-28 DIAGNOSIS — I10 Essential (primary) hypertension: Secondary | ICD-10-CM | POA: Diagnosis not present

## 2015-03-28 DIAGNOSIS — R011 Cardiac murmur, unspecified: Secondary | ICD-10-CM | POA: Insufficient documentation

## 2015-03-28 DIAGNOSIS — R627 Adult failure to thrive: Secondary | ICD-10-CM | POA: Diagnosis not present

## 2015-03-28 DIAGNOSIS — I252 Old myocardial infarction: Secondary | ICD-10-CM | POA: Diagnosis not present

## 2015-03-28 DIAGNOSIS — Z8739 Personal history of other diseases of the musculoskeletal system and connective tissue: Secondary | ICD-10-CM | POA: Diagnosis not present

## 2015-03-28 DIAGNOSIS — R54 Age-related physical debility: Secondary | ICD-10-CM | POA: Insufficient documentation

## 2015-03-28 DIAGNOSIS — Z79899 Other long term (current) drug therapy: Secondary | ICD-10-CM | POA: Insufficient documentation

## 2015-03-28 DIAGNOSIS — Z8673 Personal history of transient ischemic attack (TIA), and cerebral infarction without residual deficits: Secondary | ICD-10-CM | POA: Insufficient documentation

## 2015-03-28 DIAGNOSIS — R531 Weakness: Secondary | ICD-10-CM | POA: Diagnosis present

## 2015-03-28 DIAGNOSIS — H269 Unspecified cataract: Secondary | ICD-10-CM | POA: Diagnosis not present

## 2015-03-28 DIAGNOSIS — Z87891 Personal history of nicotine dependence: Secondary | ICD-10-CM | POA: Insufficient documentation

## 2015-03-28 DIAGNOSIS — R599 Enlarged lymph nodes, unspecified: Secondary | ICD-10-CM

## 2015-03-28 DIAGNOSIS — Z7901 Long term (current) use of anticoagulants: Secondary | ICD-10-CM | POA: Diagnosis not present

## 2015-03-28 DIAGNOSIS — R5381 Other malaise: Secondary | ICD-10-CM

## 2015-03-28 LAB — CBC WITH DIFFERENTIAL/PLATELET
BASOS PCT: 0 % (ref 0–1)
Basophils Absolute: 0 10*3/uL (ref 0.0–0.1)
EOS ABS: 0.1 10*3/uL (ref 0.0–0.7)
Eosinophils Relative: 1 % (ref 0–5)
HEMATOCRIT: 34.1 % — AB (ref 39.0–52.0)
Hemoglobin: 10.1 g/dL — ABNORMAL LOW (ref 13.0–17.0)
Lymphocytes Relative: 5 % — ABNORMAL LOW (ref 12–46)
Lymphs Abs: 0.8 10*3/uL (ref 0.7–4.0)
MCH: 25.5 pg — ABNORMAL LOW (ref 26.0–34.0)
MCHC: 29.6 g/dL — AB (ref 30.0–36.0)
MCV: 86.1 fL (ref 78.0–100.0)
MONO ABS: 1.7 10*3/uL — AB (ref 0.1–1.0)
MONOS PCT: 11 % (ref 3–12)
NEUTROS ABS: 12.7 10*3/uL — AB (ref 1.7–7.7)
Neutrophils Relative %: 83 % — ABNORMAL HIGH (ref 43–77)
Platelets: 289 10*3/uL (ref 150–400)
RBC: 3.96 MIL/uL — ABNORMAL LOW (ref 4.22–5.81)
RDW: 16.5 % — AB (ref 11.5–15.5)
WBC: 15.2 10*3/uL — ABNORMAL HIGH (ref 4.0–10.5)

## 2015-03-28 LAB — COMPREHENSIVE METABOLIC PANEL
ALBUMIN: 2.1 g/dL — AB (ref 3.5–5.0)
ALT: 44 U/L (ref 17–63)
ANION GAP: 6 (ref 5–15)
AST: 116 U/L — ABNORMAL HIGH (ref 15–41)
Alkaline Phosphatase: 169 U/L — ABNORMAL HIGH (ref 38–126)
BILIRUBIN TOTAL: 1.1 mg/dL (ref 0.3–1.2)
BUN: 21 mg/dL — ABNORMAL HIGH (ref 6–20)
CO2: 29 mmol/L (ref 22–32)
Calcium: 9.7 mg/dL (ref 8.9–10.3)
Chloride: 98 mmol/L — ABNORMAL LOW (ref 101–111)
Creatinine, Ser: 0.9 mg/dL (ref 0.61–1.24)
GFR calc Af Amer: >60 mL/min (ref >60)
GFR calc non Af Amer: >60 mL/min (ref >60)
GLUCOSE: 216 mg/dL — AB (ref 65–99)
POTASSIUM: 4.4 mmol/L (ref 3.5–5.1)
Sodium: 133 mmol/L — ABNORMAL LOW (ref 135–145)
TOTAL PROTEIN: 6.9 g/dL (ref 6.5–8.1)

## 2015-03-28 LAB — PROTIME-INR
INR: 2.01 — AB (ref 0.00–1.49)
PROTHROMBIN TIME: 22.6 s — AB (ref 11.6–15.2)

## 2015-03-28 LAB — I-STAT TROPONIN, ED: TROPONIN I, POC: 0.02 ng/mL (ref 0.00–0.08)

## 2015-03-28 LAB — I-STAT CG4 LACTIC ACID, ED: LACTIC ACID, VENOUS: 1.35 mmol/L (ref 0.5–2.0)

## 2015-03-28 MED ORDER — SODIUM CHLORIDE 0.9 % IV BOLUS (SEPSIS)
1000.0000 mL | Freq: Once | INTRAVENOUS | Status: AC
  Administered 2015-03-28: 1000 mL via INTRAVENOUS

## 2015-03-28 NOTE — Discharge Instructions (Signed)
Failure to Thrive, Adult °Adult failure to thrive is a condition that some older people develop. People with this condition are able to do fewer and fewer activities over time. They may lose interest in being with friends or may not want to eat or drink. This is not a normal part of aging. Many things can cause this. Health problems, long-term disease, depression, bad eating habits, cognitive impairment, or disability may play a role in the development of this condition. Most of the time, it is important to treat whatever is causing failure to thrive. Sometimes, though, it might not be possible to treat the condition. The person could be nearing the end of life. Then, treatment could make the person suffer longer. °CAUSES °Sometimes, no specific cause can be found. Factors that have been linked to failure to thrive include: °· Diseases and medical conditions, such as: °¨ Cancer. °¨ Diabetes. °¨ Stomach and intestinal (gastrointestinal) problems. °¨ Lung disease. °¨ Liver disease. °¨ Kidney disease. °¨ Heart problems. °¨ Thyroid disease. °¨ Neurologic problems. °¨ Vitamin deficiencies. °· Disability. This may be the result of: °¨ A broken hip. °¨ A stroke. °¨ Very bad arthritis. °¨ Infections that last a long time. °¨ A long recovery from a surgery. °¨ Mental health issues. °¨ Medicines. °¨ Eating problems. °· Medicine for certain conditions. These conditions include: °¨ Parkinson's disease. °¨ Seizure disorder. °¨ Anxiety. °¨ Pain. °¨ High blood pressure. °¨ Depression. °¨ Infections. °SYMPTOMS °· Losing weight (more than 5% of total body weight). °· Getting more tired than usual after an activity. °· Having trouble getting up after sitting. °· Not being hungry or thirsty. °· Not getting out of bed. °· Not wanting to do usual activities. °· Being depressed. °· Getting infections often. °· Having bedsores. °· Taking a long time to recover after an injury or a surgery. °· Weakness. °DIAGNOSIS °A physical exam can help  a caregiver decide if someone has adult failure to thrive. This may include questions about the person's health presently and in the past. It also may include questions about behavior and mood, such as: °· Has activity changed? °· Does the person seem sad? °· Are eating habits different? °The caregiver may ask for a list of all medicines taken because certain medicines can lead to this condition. The list should include prescription and over-the-counter medicines. The caregiver will likely order some tests. These may include: °· Blood tests to check for infection, certain diseases, deficiencies, hormone levels, malnutrition, or dehydration. °· Urine tests to check for urinary tract infection or kidney failure. °· Imaging tests. Examples are an X-ray, a computed tomography (CT) scan, and magnetic resonance imaging (MRI). °· Hearing tests. °· Vision tests. °· Cognitive tests to check thinking ability. °· Activity tests to see if the person can do basic tasks like bathing and dressing. There also are tests to check if someone can shop, cook, or move around safely. °The caregiver will check if the person is eating enough healthy food. This may include: °· Checking weight. °· Having blood tested for cholesterol and protein levels. °· Seeing if anything else might be making it hard to eat (tooth problems, poorly fitted dentures, trouble swallowing). °The person may need to see a specialist to help with diagnosis or treatment. These specialists may include a speech therapist, physical therapist, occupational therapist, dietitian, or social worker.  °TREATMENT °Treatment for adult failure to thrive depends on the cause. Caregivers also must decide if a treatment has a good chance of working. It   often takes a team of caregivers to find the right treatment. Options may include: °· Treatments to cure a disease that can cause adult failure to thrive. °· Talk therapy or medicine to treat depression. °· A better diet. Eating more  often, adding nutritional supplements between meals, or taking vitamins may be suggested. Sometimes, medicine is prescribed to boost appetite. °· Medicine changes or stopping a medicine. °· Physical therapy. °· Moving to a place that offers more aid. °HOME CARE INSTRUCTIONS °What needs to be done at home varies from person to person. This will depend on what caused the condition and how it is treated. However, basic guidelines include: °· Taking any medicine prescribed by the caregiver. Following the directions carefully is important. °· Eating healthy foods. There should be enough calories in each meal. Ask the caregiver if vitamins or nutritional supplements should be taken between meals. Consider talking with a dietitian. °· Exercising. Strength training is important. A physical therapist can help set up an exercise program that fits the person. °· Making sure the person is safe at home. °· Talking with caregivers about what should be done if the person can no longer make decisions for himself or herself. °SEEK MEDICAL CARE IF: °· There are any questions about medicines. °· There are questions about the effects of treatment. °· The person is not able to eat well. °· The person is not able to move around. °· The person feels very sad or hopeless. °SEEK IMMEDIATE MEDICAL CARE IF:  °· The person has thoughts of ending his or her life. °· The person cannot eat or drink. °· The person does not get out of bed. °· Staying at home is no longer safe. °· The person has a fever. °Document Released: 10/08/2011 Document Reviewed: 10/08/2011 °ExitCare® Patient Information ©2015 [employer]. This information is not intended to replace advice given to you by your health care provider. Make sure you discuss any questions you have with your health care provider. ° °

## 2015-03-28 NOTE — ED Notes (Signed)
Bed: [facility] Expected date:  Expected time:  Means of arrival:  Comments: Holding for ptar

## 2015-03-28 NOTE — Telephone Encounter (Signed)
I called [facility] spoke to Spencer, she will call pt tomorrow to discuss coumadin therapy/need for Lovenox bridge. If pt does need Lovenox bridge, they would need CT bx scheduled for end of next week around 04/08/15. Kim informed. She states the FL-2 is already completed and he will be heading to [facility] soon. Maudie Mercury is requesting the plan of care for pt- Need to advise pt.     [facility] [phone] Karen(pt's daughter) - [phone]

## 2015-03-28 NOTE — Telephone Encounter (Signed)
Noted. I'll sch a CT bx. How do I ref to  [facility]?  Don't they need FL-2? Erline Levine, please call [facility] to ccordinate Lovenox bridge for a CT guided bx  Thx

## 2015-03-28 NOTE — ED Notes (Signed)
Bed: [facility] Expected date:  Expected time:  Means of arrival:  Comments: 

## 2015-03-28 NOTE — Telephone Encounter (Signed)
Noted I'll review/sign papers when I get them. Thx

## 2015-03-28 NOTE — Clinical Social Work Note (Signed)
Clinical Social Work Assessment  Patient Details  Name: John [name]. MRN: [id number] Date of Birth: 1923/12/01  Date of referral:  03/28/15               Reason for consult:  Facility Placement                Permission sought to share information with:  Facility Sport and exercise psychologist, Family Supports Permission granted to share information::  Yes, Verbal Permission Granted  Name::     Sadie Haber  Agency::  [facility]  Relationship::  daughter  Contact Information:  [phone]  Housing/Transportation Living arrangements for the past 2 months:  [place] of Information:  Patient, Other (Comment Required) (family) Patient Interpreter Needed:  None Criminal Activity/Legal Involvement Pertinent to Current Situation/Hospitalization:  No - Comment as needed Significant Relationships:  Adult Children Lives with:  Self, Facility Resident Do you feel safe going back to the place where you live?  No Need for family participation in patient care:  Yes (Comment)  Care giving concerns:  Pt presenting to the Emergency department with increased weakness, requiring extensive assist to get onto stretcher to come to the hospital. Pt recently presented with suspected lymphoma, and severe decline over the weekend with mobility and strength. At this time, patient pcp still assessing patient current needs.   Social Worker assessment / plan:  CSW met with pt and pt family at bedside. Pt is currently resides at [facility]. Patient unable to stand independently and requiring 2+ assist. Patient family unable to provide 24 hour care for patient. Patient and patient family interested in skilled nursing facility placement in order to assist with patient needs, while waiting for biopsy to make a long term plan.  Employment status:  Retired Health visitor, Managed Care PT Recommendations:  Not assessed at this time Information / Referral to  community resources:  [place]  Patient/Family's Response to care:  Patient is accepting of current prognosis and waiting for further information pending biopsy. Patient and pt family appreciative of increased care at skilled nursing facility as patient unable to manage needs at home with family support.  Patient/Family's Understanding of and Emotional Response to Diagnosis, Current Treatment, and Prognosis:  Patient and patient family appear overwhelmed with current situation of possible new cancer diagnosis. Patient family however are very supportive of patient. Patient is appreciative of the opportunity for more help at a skilled nursing facility.   Emotional Assessment Appearance:  Appears stated age Attitude/Demeanor/Rapport:   (calm and coopeartive) Affect (typically observed):  Accepting Orientation:  Oriented to Self, Oriented to Place, Oriented to  Time, Oriented to Situation Alcohol / Substance use:  Not Applicable Psych involvement (Current and /or in the community):  No (Comment)  Discharge Needs  Concerns to be addressed:  Adjustment to Illness, Basic Needs Readmission within the last 30 days:  No Current discharge risk:  None Barriers to Discharge:  Barriers Resolved   Erminie Foulks, [place], LCSW 03/28/2015, 1:32 PM

## 2015-03-28 NOTE — Progress Notes (Signed)
Pt accepted to [facility] facility. RN Can call report to [phone]. Patient family to complete admission paperwork at 330. Patient to be transferred around 4pm by ptar. Pt signed fl2 and medical necessity form in chart.   Belia Heman, [place] Work  [employer] [phone]

## 2015-03-28 NOTE — ED Provider Notes (Signed)
CSN: [id number]     Arrival date & time 03/28/15  0277 History   First MD Initiated Contact with Patient 03/28/15 1014     Chief Complaint  Patient presents with  . Weakness     (Consider location/radiation/quality/duration/timing/severity/associated sxs/prior Treatment) Patient is a 81 y.o. male presenting with weakness. The history is provided by the patient and a relative.  Weakness This is a new problem. The current episode started more than 2 days ago. The problem occurs constantly. The problem has been rapidly worsening. Pertinent negatives include no chest pain, no abdominal pain and no shortness of breath. Nothing aggravates the symptoms. Nothing relieves the symptoms. He has tried nothing for the symptoms. The treatment provided no relief.    Past Medical History  Diagnosis Date  . Anemia     low blood platelets  . Arthritis   . Blood transfusion   . Cancer     skin CA  . Cataract   . Heart murmur   . Hyperlipidemia   . Hypertension   . TIA (transient ischemic attack)   . Atrial fibrillation     on Coumadin  . Myocardial infarction     after colon surgery   Past Surgical History  Procedure Laterality Date  . Colon surgery      partial colectomy  . Total hip arthroplasty      LEFT HIP  . Coronary artery bypass graft      3 vessels  . Heart stent    . Thoracotomy    . Lens implants    . Colon surgery      right   Family History  Problem Relation Age of Onset  . Colon polyps Mother   . Stroke Father   . Stroke Other    Social History  Substance Use Topics  . Smoking status: Former Smoker    Quit date: 10/28/1965  . Smokeless tobacco: None  . Alcohol Use: 0.0 oz/week    Review of Systems  Respiratory: Negative for shortness of breath.   Cardiovascular: Negative for chest pain.  Gastrointestinal: Negative for abdominal pain.  Neurological: Positive for weakness.  All other systems reviewed and are negative.     Allergies  Polysporin  Home  Medications   Prior to Admission medications   Medication Sig Start Date End Date Taking? Authorizing Provider  amLODipine (NORVASC) 2.5 MG tablet Take 2.5 mg by mouth daily.    Yes Historical Provider, MD  Cholecalciferol (VITAMIN D) 1000 UNITS capsule Take 1,000 Units by mouth daily.     Yes Historical Provider, MD  Diphenhydramine-APAP, sleep, (TYLENOL PM EXTRA STRENGTH PO) Take 1 capsule by mouth at bedtime as needed (sleep).    Yes Historical Provider, MD  ferrous sulfate 325 (65 FE) MG tablet Take 1 tablet (325 mg total) by mouth daily with breakfast. 12/23/14  Yes Aleksei Plotnikov V, MD  fluocinonide (LIDEX) 0.05 % external solution APPLY ON THE SCALP DAILY 02/21/15  Yes Historical Provider, MD  metoprolol succinate (TOPROL-XL) 50 MG 24 hr tablet Take 1 tablet (50 mg total) by mouth 2 (two) times daily. Take with or immediately following a meal. Patient taking differently: Take 25 mg by mouth daily. Take with or immediately following a meal. 01/07/15  Yes Evans Lance, MD  polyethylene glycol [facility] / Floria Raveling) packet Take 17 g by mouth daily as needed (constipation).   Yes Historical Provider, MD  warfarin (COUMADIN) 2.5 MG tablet TAKE AS DIRECTED BY COUMADIN CLINIC 02/14/15  Yes Carleene Overlie  Peyton Najjar, MD   BP 126/71 mmHg  Pulse 111  Temp(Src) 98.1 F (36.7 C) (Oral)  Resp 16  SpO2 98% Physical Exam  Constitutional: He is oriented to person, place, and time. He appears well-developed. He has a sickly appearance. No distress.  Frail-appearing, mildly cachectic  HENT:  Head: Normocephalic and atraumatic.  Eyes: Conjunctivae are normal.  Neck: Neck supple. No tracheal deviation present.  Cardiovascular: Normal rate, regular rhythm and normal heart sounds.   Pulmonary/Chest: Effort normal and breath sounds normal. No respiratory distress.  Abdominal: Soft. He exhibits no distension. There is no tenderness.  Neurological: He is alert and oriented to person, place, and time.  Skin: Skin is  warm and dry.  Psychiatric: He has a normal mood and affect.    ED Course  Procedures (including critical care time) Labs Review Labs Reviewed  CBC WITH DIFFERENTIAL/PLATELET - Abnormal; Notable for the following:    WBC 15.2 (*)    RBC 3.96 (*)    Hemoglobin 10.1 (*)    HCT 34.1 (*)    MCH 25.5 (*)    MCHC 29.6 (*)    RDW 16.5 (*)    Neutrophils Relative % 83 (*)    Neutro Abs 12.7 (*)    Lymphocytes Relative 5 (*)    Monocytes Absolute 1.7 (*)    All other components within normal limits  COMPREHENSIVE METABOLIC PANEL - Abnormal; Notable for the following:    Sodium 133 (*)    Chloride 98 (*)    Glucose, Bld 216 (*)    BUN 21 (*)    Albumin 2.1 (*)    AST 116 (*)    Alkaline Phosphatase 169 (*)    All other components within normal limits  PROTIME-INR - Abnormal; Notable for the following:    Prothrombin Time 22.6 (*)    INR 2.01 (*)    All other components within normal limits  URINALYSIS, ROUTINE W REFLEX MICROSCOPIC (NOT AT [facility])  I-STAT CG4 LACTIC ACID, ED  I-STAT TROPOININ, ED    Imaging Review No results found. I have personally reviewed and evaluated these images and lab results as part of my medical decision-making.   EKG Interpretation None      MDM   Final diagnoses:  Lymphoma  Failure to thrive in adult  Inability to walk  Physical deconditioning    81 year old male with history of coronary artery disease status post remote CABG and recently discovered lymphoma suspected on CT scan is complaining of progressive weight loss, weakness, loss of appetite, difficulty with activities of daily living. Since being diagnosed 5 days ago there has been a precipitous worsening in the patient's condition resulting in the inability for the patient to walk independently and function appropriately at the level of care provided in his independent living facility.given new diagnosis and rapid progression of symptoms hospitalist was consulted to evaluate for  admission, reviewed the case and discussed with the case manager, recommended further outpatient management of this issue.family is in agreement with this plan going forward and he can receive outpatient physical therapy and further needs at a higher level of care facility.  Per case manager: "CM confirmed pt to go to [facility] facility and he or daughter [phone]) will need contacting on scheduled procedures so he can be transported to procedures from [facility]"  Leo Grosser, MD 03/28/15 [phone]

## 2015-03-28 NOTE — ED Notes (Addendum)
Pt comes in today with EMS from an independent living facility ([facility])  with a c/o weakness. Per family pt started feeling this way yesterday. Pt usually walks independently but today he was having difficulty getting around in the wheelchair. Pt is alert and oriented with some confusion which is normal per family.

## 2015-03-28 NOTE — Telephone Encounter (Signed)
Kim from [facility] ask you to call her about [phone]

## 2015-03-28 NOTE — ED Notes (Signed)
Family at bedside requesting update. Family given update on plan of care.

## 2015-03-28 NOTE — Progress Notes (Addendum)
65 CM spoke with son in room with pt about medicare guidelines, spoke with Santiago Glad earlier in day son mentioned "He just got diagnosed with cancer.  I am afraid he may not get to his appointments from the facility"  CM discussed facility having transport services Cm requested ED RN send a copy of CM notes to [facility] to be given to Nursing staff there to indicate contact numbers for pt, daughter &  upcoming appointments with guided CT and coumadin clinic  1500 CM called coumadin clinic [phone] and spoke with Jinny Blossom to provide [facility] and Santiago Glad numbers to be contacted for scheduling procedures Updated EDP 1451 CM received a return call from [place], [place] RN stating the CT Guided has been ordered but not schedule and the coumadin clinic [facility]) has been contacted to review pt clinicals to determine how to manage pt anticoagulant and will contact pt on 03/29/15 CM confirmed pt to go to [place] facility and he or daughter [phone])  will need contacting on scheduled procedures so he can be transported to procedures from [facility] CM confirmed Santiago Glad, daughter's cell contact number  [address] [place] updated 1342 CM updated ED SW and RN  1338 CM, pt, daughter, Santiago Glad and son in law spoke with Marzetta Board, Plotnikov's RN to update on pt concerns, to discuss moving forward with Plonikov's plan listed in EPIC as "Options: 1. CT-guided LN bx followed by Hem-Onc consult or 2. Do nothing. I favor #1. Th pt will get in touch with me on Monday" Pt and family confirm with stacy that they would like to take option 1 for care and want to know how to proceed outpatient Discussed private pay [facility] anf placement (shared bed) 1316 Cm spoke with patricia at Dr Alain Marion office [phone] Reviewed pt and family concerns with care Transferred to [facility] RN phone to leave a voice message CM requesting to speak with MD or RN Pending a return  1304 CM & SW finished reviewing in details with family medicare  guidelines for admissdion, options of care at outpatient level, home health [facility]) (length of stay in home, types of [facility] staff available, coverage, primary caregiver, up to 24 hrs before services may be started) and Private duty nursing (PDN-coverage, length of stay in the home types of staff available). CM provided pt/family with a list of [place] home health agencies and PDN.  Family states they were informed to bring pt to ED by pcp Anticipating further tests after pt is "stabilized" per pcp Reports pt was fine until last week and had progression of s/s over the weekend to include difficulty in moving right lower extremities  Family confirms pt is hard of hearing at times but has no memory issues  1240 Cm received a call from hospitalist ([place]) inquiring about pt level of care and name of assisted living facility  CM spoke with pt and family to confirm pt is from [place] independent living not assisted living facility Spoke with ED RN Spoke with ED SW, EDP, [place]

## 2015-03-28 NOTE — Telephone Encounter (Signed)
John [name] is with patient and family in [facility] ED now. He declined over the weekend. He is receiving IV fluids for dehydration and is being stabilized for discharge now. John [name] reports pt is having difficulty ambulating RLE. I was placed on speaker phone with patient's daughter and she is requesting SN facility referral to [facility] and they do wish to proceed with CT guided LN bx. Please advise.

## 2015-03-28 NOTE — Progress Notes (Signed)
CSW reached out to [employer], and spoke with Nursing Director Sam. CSW informed her that the patient is ready for discharge.   Sam/Nursing Director/ states that the patient is welcomed to return to facility. CSW made nurse aware who states she will call facility for report and arrange transportation.  Willette Brace [phone] ED CSW 03/28/2015 5:09 PM

## 2015-03-28 NOTE — ED Notes (Signed)
Case manager at bedside 

## 2015-03-29 ENCOUNTER — Telehealth: Payer: Self-pay | Admitting: Pharmacist

## 2015-03-29 NOTE — Telephone Encounter (Signed)
Nurse Deneise Lever) from SNF called back - per MD at [facility], they will manage patient's bridging for upcoming biopsy. Gave nurse verbal orders for Coumadin/Lovenox bridge as follows:  Day -5: no warfarin, no Lovenox Day -4: start Lovenox 80mg  SQ BID, no warfarin Day -3: Lovenox 80mg  SQ BID, no warfarin Day -2: Lovenox 80mg  SQ BID, no warfarin Day -1: Lovenox 80mg  SQ AM dose only, no PM dose, no warfarin Day 0 (biopsy): no Lovenox, restart warfarin in PM Day 1: Lovenox 80mg  SQ BID and warfarin PTA dose Continue until INR > 2 then d/c Lovenox.  Per nurse, pt will likely remain at [facility] long-term and Coumadin will be managed there.

## 2015-03-29 NOTE — Telephone Encounter (Signed)
Received a call from [facility] [name] ED on 8/29 regarding John [name]'s discharge. Pt to undergo CT lung biopsy. He is currently on Coumadin for afib and will need Lovenox bridging for procedure given history of TIA (CHADS2 score is 4 - HTN, age, TIA). He was discharged to [facility] [phone]. Spoke with charge nurse at [facility] to coordinate anticoagulation management for John [name]. Lung biopsy has not been scheduled yet, although tentative plans for 9/8 or 9/9 when I spoke with ED yesterday. [facility] to coordinate scheduling biopsy. Per MD at [facility], Coumadin clinic is to manage patient's anticoag and bridging. Scheduled pt for appt on 8/31 for bridging - wt 80kg, SCr 0.9. Will need Lovenox 80mg  BID for bridging (don't need to send rx - Lovenox will be supplied by SNF). Will provide pt with instructions dictated by day -5, -4 etc since we do not have a set procedure date yet. Head nurse verbalized understanding of this plan. Also updated nurse with patient's most recent Coumadin dosing.

## 2015-03-30 ENCOUNTER — Telehealth: Payer: Self-pay | Admitting: Internal Medicine

## 2015-03-30 NOTE — Telephone Encounter (Signed)
Spoke with Sam, the 2nd shift supervisor at [employer].  Advised pt is due for repeat INR check on 04/05/15.  She states pt is pending CT guided lymph node biopsy, date has not been determined yet.  Requested they call us back once biopsy scheduled to advised of date of procedure and if pt will need to hold his Coumadin prior to procedure.  Sam stated she will call us back once scheduled to advise.

## 2015-03-30 NOTE — Telephone Encounter (Signed)
New message      When is pt due to have another pt/inr?  He is having a CT guided lymph node biopsy soon

## 2015-04-08 ENCOUNTER — Telehealth: Payer: Self-pay | Admitting: Internal Medicine

## 2015-04-08 ENCOUNTER — Other Ambulatory Visit: Payer: Self-pay | Admitting: Radiology

## 2015-04-08 NOTE — Telephone Encounter (Signed)
New message      Pt is having a lung biopsy on 9-12.  [facility] started him on lovenox yesterday afternoon.  He did not get the morning dosage yesterday but he got one this am.  Should they "make up" the missed morning dosage?

## 2015-04-08 NOTE — Telephone Encounter (Signed)
No, he should not receive an extra Lovenox dose. They should continue on with 80mg  twice a day as scheduled (see telephone note on 8/30 if [facility] has any questions).

## 2015-04-11 ENCOUNTER — Ambulatory Visit (HOSPITAL_COMMUNITY)
Admission: RE | Admit: 2015-04-11 | Discharge: 2015-04-11 | Disposition: A | Discharge status: Home / Self Care | Payer: Medicare Other | Source: Ambulatory Visit | Attending: Internal Medicine | Admitting: Internal Medicine

## 2015-04-11 ENCOUNTER — Encounter (HOSPITAL_COMMUNITY): Payer: Self-pay

## 2015-04-11 DIAGNOSIS — R630 Anorexia: Secondary | ICD-10-CM | POA: Insufficient documentation

## 2015-04-11 DIAGNOSIS — Z79899 Other long term (current) drug therapy: Secondary | ICD-10-CM | POA: Diagnosis not present

## 2015-04-11 DIAGNOSIS — Z7901 Long term (current) use of anticoagulants: Secondary | ICD-10-CM | POA: Insufficient documentation

## 2015-04-11 DIAGNOSIS — Z87891 Personal history of nicotine dependence: Secondary | ICD-10-CM | POA: Diagnosis not present

## 2015-04-11 DIAGNOSIS — Z955 Presence of coronary angioplasty implant and graft: Secondary | ICD-10-CM | POA: Insufficient documentation

## 2015-04-11 DIAGNOSIS — R599 Enlarged lymph nodes, unspecified: Secondary | ICD-10-CM

## 2015-04-11 DIAGNOSIS — R591 Generalized enlarged lymph nodes: Secondary | ICD-10-CM | POA: Diagnosis present

## 2015-04-11 DIAGNOSIS — I252 Old myocardial infarction: Secondary | ICD-10-CM | POA: Diagnosis not present

## 2015-04-11 DIAGNOSIS — I10 Essential (primary) hypertension: Secondary | ICD-10-CM | POA: Insufficient documentation

## 2015-04-11 DIAGNOSIS — E785 Hyperlipidemia, unspecified: Secondary | ICD-10-CM | POA: Insufficient documentation

## 2015-04-11 DIAGNOSIS — R7989 Other specified abnormal findings of blood chemistry: Secondary | ICD-10-CM | POA: Insufficient documentation

## 2015-04-11 DIAGNOSIS — R634 Abnormal weight loss: Secondary | ICD-10-CM | POA: Insufficient documentation

## 2015-04-11 DIAGNOSIS — R531 Weakness: Secondary | ICD-10-CM | POA: Diagnosis not present

## 2015-04-11 DIAGNOSIS — Z8673 Personal history of transient ischemic attack (TIA), and cerebral infarction without residual deficits: Secondary | ICD-10-CM | POA: Insufficient documentation

## 2015-04-11 DIAGNOSIS — Z951 Presence of aortocoronary bypass graft: Secondary | ICD-10-CM | POA: Diagnosis not present

## 2015-04-11 DIAGNOSIS — I482 Chronic atrial fibrillation: Secondary | ICD-10-CM | POA: Diagnosis not present

## 2015-04-11 LAB — BASIC METABOLIC PANEL
ANION GAP: 10 (ref 5–15)
BUN: 27 mg/dL — ABNORMAL HIGH (ref 6–20)
CALCIUM: 9.9 mg/dL (ref 8.9–10.3)
CO2: 24 mmol/L (ref 22–32)
CREATININE: 0.96 mg/dL (ref 0.61–1.24)
Chloride: 100 mmol/L — ABNORMAL LOW (ref 101–111)
GFR calc non Af Amer: >60 mL/min (ref >60)
Glucose, Bld: 113 mg/dL — ABNORMAL HIGH (ref 65–99)
Potassium: 4.4 mmol/L (ref 3.5–5.1)
SODIUM: 134 mmol/L — AB (ref 135–145)

## 2015-04-11 LAB — CBC WITH DIFFERENTIAL/PLATELET
BASOS ABS: 0 10*3/uL (ref 0.0–0.1)
BASOS PCT: 0 % (ref 0–1)
EOS ABS: 0.2 10*3/uL (ref 0.0–0.7)
EOS PCT: 1 % (ref 0–5)
HCT: 36.8 % — ABNORMAL LOW (ref 39.0–52.0)
HEMOGLOBIN: 10.9 g/dL — AB (ref 13.0–17.0)
LYMPHS ABS: 0.9 10*3/uL (ref 0.7–4.0)
Lymphocytes Relative: 6 % — ABNORMAL LOW (ref 12–46)
MCH: 25.8 pg — AB (ref 26.0–34.0)
MCHC: 29.6 g/dL — ABNORMAL LOW (ref 30.0–36.0)
MCV: 87 fL (ref 78.0–100.0)
Monocytes Absolute: 1.5 10*3/uL — ABNORMAL HIGH (ref 0.1–1.0)
Monocytes Relative: 9 % (ref 3–12)
NEUTROS PCT: 84 % — AB (ref 43–77)
Neutro Abs: 13.8 10*3/uL — ABNORMAL HIGH (ref 1.7–7.7)
PLATELETS: 300 10*3/uL (ref 150–400)
RBC: 4.23 MIL/uL (ref 4.22–5.81)
RDW: 16.9 % — ABNORMAL HIGH (ref 11.5–15.5)
WBC: 16.4 10*3/uL — AB (ref 4.0–10.5)

## 2015-04-11 LAB — PROTIME-INR
INR: 1.83 — AB (ref 0.00–1.49)
PROTHROMBIN TIME: 21.1 s — AB (ref 11.6–15.2)

## 2015-04-11 LAB — APTT: APTT: 37 s (ref 24–37)

## 2015-04-11 MED ORDER — SODIUM CHLORIDE 0.9 % IV SOLN
INTRAVENOUS | Status: DC
  Administered 2015-04-11: 13:00:00 via INTRAVENOUS

## 2015-04-11 MED ORDER — FENTANYL CITRATE (PF) 100 MCG/2ML IJ SOLN
INTRAMUSCULAR | Status: AC
  Filled 2015-04-11: qty 4

## 2015-04-11 MED ORDER — MIDAZOLAM HCL 2 MG/2ML IJ SOLN
INTRAMUSCULAR | Status: AC
  Filled 2015-04-11: qty 6

## 2015-04-11 NOTE — H&P (Signed)
Chief Complaint: Patient was seen in consultation today for CT-guided retroperitoneal/para-aortic lymph node biopsy   Referring Physician(s): Plotnikov,Aleksei V  History of Present Illness: John [name]. is a 81 y.o. male  with past medical history significant for hypertension, chronic atrial fibrillation, hyperlipidemia, prior TIA, prior MI. He has recently presented with tremors, weakness, incontinence, weight loss, anorexia and elevated LFT's. Recent CT of abdomen and pelvis on 03/24/15 has revealed multiple lymph nodes predominantly in the periaortic /pericaval and portacaval space suggestive of underlying lymphoma. He presents today for CT-guided biopsy of an enlarged retroperitoneal/paraaortic lymph node.   Past Medical History  Diagnosis Date  . Anemia     low blood platelets  . Arthritis   . Blood transfusion   . Cancer     skin CA  . Cataract   . Heart murmur   . Hyperlipidemia   . Hypertension   . TIA (transient ischemic attack)   . Atrial fibrillation     on Coumadin  . Myocardial infarction     after colon surgery    Past Surgical History  Procedure Laterality Date  . Colon surgery      partial colectomy  . Total hip arthroplasty      LEFT HIP  . Coronary artery bypass graft      3 vessels  . Heart stent    . Thoracotomy    . Lens implants    . Colon surgery      right    Allergies: Polysporin  Medications: Prior to Admission medications   Medication Sig Start Date End Date Taking? Authorizing Provider  amLODipine (NORVASC) 2.5 MG tablet Take 2.5 mg by mouth daily.    Yes Historical Provider, MD  Cholecalciferol (VITAMIN D) 1000 UNITS capsule Take 1,000 Units by mouth daily.     Yes Historical Provider, MD  Diphenhydramine-APAP, sleep, (TYLENOL PM EXTRA STRENGTH PO) Take 1 capsule by mouth at bedtime.    Yes Historical Provider, MD  enoxaparin (LOVENOX) 80 MG/0.8ML injection Inject 80 mg into the skin 2 (two) times daily.   Yes Historical  Provider, MD  ferrous sulfate 325 (65 FE) MG tablet Take 1 tablet (325 mg total) by mouth daily with breakfast. 12/23/14  Yes Aleksei Plotnikov V, MD  fluocinonide (LIDEX) 0.05 % external solution APPLY ON THE SCALP DAILY 02/21/15  Yes Historical Provider, MD  metoprolol succinate (TOPROL-XL) 50 MG 24 hr tablet Take 1 tablet (50 mg total) by mouth 2 (two) times daily. Take with or immediately following a meal. Patient taking differently: Take 25 mg by mouth daily. Take with or immediately following a meal. 01/07/15  Yes Evans Lance, MD  polyethylene glycol [facility] Floria Raveling) packet Take 17 g by mouth daily as needed (constipation).   Yes Historical Provider, MD  warfarin (COUMADIN) 2.5 MG tablet TAKE AS DIRECTED BY COUMADIN CLINIC Patient taking differently: Take one tablet by mouth daily 02/14/15  Yes Evans Lance, MD     Family History  Problem Relation Age of Onset  . Colon polyps Mother   . Stroke Father   . Stroke Other     Social History   Social History  . Marital Status: Widowed    Spouse Name: Opal Hallum  . Number of Children: N/A  . Years of Education: N/A   Occupational History  .     Social History Main Topics  . Smoking status: Former Smoker    Quit date: 10/28/1965  . Smokeless  tobacco: None  . Alcohol Use: 0.0 oz/week  . Drug Use: None  . Sexual Activity: Yes   Other Topics Concern  . None   Social History Narrative      Review of Systems see above; patient denies recent fever, chills, headache, chest pain, cough; he does report some occasional dyspnea with exertion, intermittent abdominal and back discomfort, occ nausea and prior hematuria  Vital Signs: BP 104/64 mmHg  Pulse 96  Temp(Src) 97.2 F (36.2 C) (Oral)  Resp 18  Ht 6\' 1"  (1.854 m)  Wt 178 lb (80.74 kg)  BMI 23.49 kg/m2  SpO2 96%  Physical Exam patient awake, alert. Chest is clear to auscultation bilaterally anteriorly. Heart irregularly irregular ; abdomen soft, positive bowel  sounds, currently nontender. Extremities with no significant edema   Mallampati Score:     Imaging: Dg Chest 2 View  03/18/2015   CLINICAL DATA:  Weakness and weight loss  EXAM: CHEST - 2 VIEW  COMPARISON:  07/16/2008  FINDINGS: Cardiac shadow is at the upper limits of normal in size. Postsurgical changes are noted. Tortuosity of the thoracic aorta is again noted. There also appears to be some aneurysmal dilatation of the ascending aorta. The lungs are clear bilaterally. No acute bony abnormality is noted.  IMPRESSION: Chronic changes in the thoracic aorta.  No acute abnormality noted.   Electronically Signed   By: Inez Catalina M.D.   On: 03/18/2015 08:09   Ct Abdomen Pelvis W Contrast  03/24/2015   CLINICAL DATA:  Recent weight loss with weakness, history bowel surgery  EXAM: CT ABDOMEN AND PELVIS WITH CONTRAST  TECHNIQUE: Multidetector CT imaging of the abdomen and pelvis was performed using the standard protocol following bolus administration of intravenous contrast.  CONTRAST:  100 mL Omnipaque 300  COMPARISON:  05/12/2004  FINDINGS: Lung bases demonstrate mild right basilar scarring. No focal infiltrate or sizable effusion is seen.  The liver again demonstrates a hypodense lesion within the left lobe consistent with a small cysts. The spleen, gallbladder, adrenal glands and pancreas are within normal limits. The kidneys are well visualized bilaterally and again demonstrate bilateral renal cysts right greater than left. The largest of these lies within the midportion of the right kidney measuring 5 cm in greatest dimension.  There is new bulky lymphadenopathy identified in the pericaval and periaortic region. The largest of these areas lies interposed between the aorta in the left kidney and measures approximately 4.1 by 4.3 cm. This is best seen on image number 27 of series 2. A 9 mm short axis lymph node is noted adjacent to the distal esophagus best seen on image number 7 of series 2 a large lymph  node complex is noted in the portacaval space best seen on image number 22 of series 2. This measures 3.6 x 5.9 cm in greatest dimension.  The gastric wall is diffusely thickened although this is of uncertain significance as nose distension is seen. Postsurgical changes are noted with partial colonic resection. No obstructive changes are seen. Contrast passes into the colon without difficulty. Aortoiliac calcifications are noted without aneurysmal dilatation.  The bladder is well distended. The prostate is mildly enlarged. No pelvic lymphadenopathy is seen. A large left inguinal hernia is noted with colon within. No incarceration is noted. No significant inguinal lymphadenopathy is noted. The osseous structures show changes of prior left hip replacement and degenerative change of the lumbar spine. No compression deformities are seen.  IMPRESSION: Stable hepatic and renal cysts.  Multiple lymph  nodes as described above predominately in the periaortic, pericaval and portacaval space. These are highly suggestive of underlying lymphoma. Some of these in the periaortic and pericaval region would be amenable to percutaneous biopsy.  Diffuse thickening of the gastric wall of uncertain significance as there is no significant distension identified.  These results will be called to the ordering clinician or representative by the Radiologist Assistant, and communication documented in the PACS or zVision Dashboard.   Electronically Signed   By: Inez Catalina M.D.   On: 03/24/2015 07:52    Labs:  CBC:  Recent Labs  11/08/14 1117 12/22/14 1136 03/17/15 1715 03/28/15 1100  WBC 11.4* 11.4* 16.5* 15.2*  HGB 11.6* 11.6* 10.9* 10.1*  HCT 35.6* 35.8* 34.9* 34.1*  PLT 272.0 236.0 327.0 289    COAGS:  Recent Labs  01/24/15 1117 02/14/15 1126 03/15/15 1139 03/28/15 1100  INR 3.1 3.0 3.3 2.01*    BMP:  Recent Labs  11/08/14 1117 12/22/14 1136 03/17/15 1715 03/28/15 1100  NA 135 134* 137 133*  K 4.8 4.4  4.9 4.4  CL 101 100 100 98*  CO2 28 26 28 29   GLUCOSE 135* 178* 133* 216*  BUN 18 20 25* 21*  CALCIUM 10.1 9.7 10.3 9.7  CREATININE 0.91 1.00 0.94 0.90  GFRNONAA  --   --   --  >60  GFRAA  --   --   --  >60    LIVER FUNCTION TESTS:  Recent Labs  05/13/14 1052 03/17/15 1715 03/28/15 1100  BILITOT 1.0 0.6 1.1  AST 20 71* 116*  ALT 15 24 44  ALKPHOS 86 187* 169*  PROT 7.5 8.1 6.9  ALBUMIN 3.1* 2.9* 2.1*    TUMOR MARKERS: No results for input(s): AFPTM, CEA, CA199, CHROMGRNA in the last 8760 hours.  Assessment and Plan: Wilbert Hayashi. is a 81 y.o. male  with past medical history significant for hypertension, chronic atrial fibrillation, hyperlipidemia, prior TIA, prior MI. He has recently presented with tremors, weakness, incontinence, weight loss, anorexia and elevated LFT's. Recent CT of abdomen and pelvis on 03/24/15 has revealed multiple lymph nodes predominantly in the periaortic /pericaval and portacaval space suggestive of underlying lymphoma. He presents today for CT-guided biopsy of an enlarged retroperitoneal/paraaortic lymph node.Risks and benefits discussed with the patient/family including, but not limited to bleeding, infection, damage to adjacent structures or low yield requiring additional tests.All of the patient's questions were answered, patient is agreeable to proceed.Consent signed and in chart.     Thank you for this interesting consult.  I greatly enjoyed meeting Daemion Mcniel. and look forward to participating in their care.  A copy of this report was sent to the requesting provider on this date.  Signed: D. Jahmari Esbenshade 04/11/2015, 12:25 PM   I spent a total of   15 minutes in face to face in clinical consultation, greater than 50% of which was counseling/coordinating care for CT-guided retroperitoneal/para-aortic lymph node biopsy

## 2015-04-11 NOTE — Discharge Instructions (Signed)
Needle Biopsy °Care After °These instructions give you information on caring for yourself after your procedure. Your doctor may also give you more specific instructions. Call your doctor if you have any problems or questions after your procedure. °HOME CARE °· Rest for 4 hours after your biopsy, except for getting up to go to the bathroom or as told. °· Keep the places where the needles were put in clean and dry. °¨ Do not put powder or lotion on the sites. °¨ Do not shower until 24 hours after the test. Remove all bandages (dressings) before showering. °¨ Remove all bandages at least once every day. Gently clean the sites with soap and water. Keep putting a new bandage on until the skin is closed. °Finding out the results of your test °Ask your doctor when your test results will be ready. Make sure you follow up and get the test results. °GET HELP RIGHT AWAY IF:  °· You have shortness of breath or trouble breathing. °· You have pain or cramping in your belly (abdomen). °· You feel sick to your stomach (nauseous) or throw up (vomit). °· Any of the places where the needles were put in: °¨ Are puffy (swollen) or red. °¨ Are sore or hot to the touch. °¨ Are draining yellowish-white fluid (pus). °¨ Are bleeding after 10 minutes of pressing down on the site. Have someone keep pressing on any place that is bleeding until you see a doctor. °· You have any unusual pain that will not stop. °· You have a fever. °If you go to the emergency room, tell the nurse that you had a biopsy. Take this paper with you to show the nurse. °MAKE SURE YOU:  °· Understand these instructions. °· Will watch your condition. °· Will get help right away if you are not doing well or get worse. °Document Released: 06/28/2008 Document Revised: 10/08/2011 Document Reviewed: 06/28/2008 °ExitCare® Patient Information ©2015 [employer]. This information is not intended to replace advice given to you by your health care provider. Make sure you discuss  any questions you have with your health care provider. °Conscious Sedation °Sedation is the use of medicines to promote relaxation and relieve discomfort and anxiety. Conscious sedation is a type of sedation. Under conscious sedation you are less alert than normal but are still able to respond to instructions or stimulation. Conscious sedation is used during short medical and dental procedures. It is milder than deep sedation or general anesthesia and allows you to return to your regular activities sooner.  °LET YOUR HEALTH CARE PROVIDER KNOW ABOUT:  °· Any allergies you have. °· All medicines you are taking, including vitamins, herbs, eye drops, creams, and over-the-counter medicines. °· Use of steroids (by mouth or creams). °· Previous problems you or members of your family have had with the use of anesthetics. °· Any blood disorders you have. °· Previous surgeries you have had. °· Medical conditions you have. °· Possibility of pregnancy, if this applies. °· Use of cigarettes, alcohol, or illegal drugs. °RISKS AND COMPLICATIONS °Generally, this is a safe procedure. However, as with any procedure, problems can occur. Possible problems include: °· Oversedation. °· Trouble breathing on your own. You may need to have a breathing tube until you are awake and breathing on your own. °· Allergic reaction to any of the medicines used for the procedure. °BEFORE THE PROCEDURE °· You may have blood tests done. These tests can help show how well your kidneys and liver are working. They can also   show how well your blood clots. °· A physical exam will be done.   °· Only take medicines as directed by your health care provider. You may need to stop taking medicines (such as blood thinners, aspirin, or nonsteroidal anti-inflammatory drugs) before the procedure.   °· Do not eat or drink at least 6 hours before the procedure or as directed by your health care provider. °· Arrange for a responsible adult, family member, or friend to  take you home after the procedure. He or she should stay with you for at least 24 hours after the procedure, until the medicine has worn off. °PROCEDURE  °· An intravenous (IV) catheter will be inserted into one of your veins. Medicine will be able to flow directly into your body through this catheter. You may be given medicine through this tube to help prevent pain and help you relax. °· The medical or dental procedure will be done. °AFTER THE PROCEDURE °· You will stay in a recovery area until the medicine has worn off. Your blood pressure and pulse will be checked.   °·  Depending on the procedure you had, you may be allowed to go home when you can tolerate liquids and your pain is under control. °Document Released: 04/10/2001 Document Revised: 07/21/2013 Document Reviewed: 03/23/2013 °ExitCare® Patient Information ©2015 [employer]. This information is not intended to replace advice given to you by your health care provider. Make sure you discuss any questions you have with your health care provider. ° °

## 2015-04-11 NOTE — Progress Notes (Signed)
Patient ID: John [name]., male   DOB: May 09, 1924, 81 y.o.   MRN: [id number] Patient's PT/INR today is 21.1/1.83. Patient will continue to hold Coumadin ; may have Lovenox up until 24 hours preprocedure. Retroperitoneal node biopsy has been rescheduled to 9/14. Patient and family given pre procedure instructions.

## 2015-04-12 ENCOUNTER — Other Ambulatory Visit: Payer: Self-pay | Admitting: Radiology

## 2015-04-13 ENCOUNTER — Ambulatory Visit (HOSPITAL_COMMUNITY)
Admission: RE | Admit: 2015-04-13 | Discharge: 2015-04-13 | Disposition: A | Discharge status: Home / Self Care | Payer: Medicare Other | Source: Ambulatory Visit | Attending: Internal Medicine | Admitting: Internal Medicine

## 2015-04-13 ENCOUNTER — Encounter (HOSPITAL_COMMUNITY): Payer: Self-pay

## 2015-04-13 DIAGNOSIS — R531 Weakness: Secondary | ICD-10-CM | POA: Insufficient documentation

## 2015-04-13 DIAGNOSIS — I252 Old myocardial infarction: Secondary | ICD-10-CM | POA: Diagnosis not present

## 2015-04-13 DIAGNOSIS — I482 Chronic atrial fibrillation: Secondary | ICD-10-CM | POA: Diagnosis not present

## 2015-04-13 DIAGNOSIS — R634 Abnormal weight loss: Secondary | ICD-10-CM | POA: Diagnosis not present

## 2015-04-13 DIAGNOSIS — Z79899 Other long term (current) drug therapy: Secondary | ICD-10-CM | POA: Diagnosis not present

## 2015-04-13 DIAGNOSIS — Z7901 Long term (current) use of anticoagulants: Secondary | ICD-10-CM | POA: Insufficient documentation

## 2015-04-13 DIAGNOSIS — I10 Essential (primary) hypertension: Secondary | ICD-10-CM | POA: Insufficient documentation

## 2015-04-13 DIAGNOSIS — Z8673 Personal history of transient ischemic attack (TIA), and cerebral infarction without residual deficits: Secondary | ICD-10-CM | POA: Diagnosis not present

## 2015-04-13 DIAGNOSIS — R630 Anorexia: Secondary | ICD-10-CM | POA: Diagnosis not present

## 2015-04-13 DIAGNOSIS — E785 Hyperlipidemia, unspecified: Secondary | ICD-10-CM | POA: Diagnosis not present

## 2015-04-13 DIAGNOSIS — R590 Localized enlarged lymph nodes: Secondary | ICD-10-CM | POA: Insufficient documentation

## 2015-04-13 LAB — CBC
HEMATOCRIT: 34.8 % — AB (ref 39.0–52.0)
Hemoglobin: 10.3 g/dL — ABNORMAL LOW (ref 13.0–17.0)
MCH: 25.8 pg — ABNORMAL LOW (ref 26.0–34.0)
MCHC: 29.6 g/dL — ABNORMAL LOW (ref 30.0–36.0)
MCV: 87.2 fL (ref 78.0–100.0)
PLATELETS: 283 10*3/uL (ref 150–400)
RBC: 3.99 MIL/uL — ABNORMAL LOW (ref 4.22–5.81)
RDW: 16.6 % — AB (ref 11.5–15.5)
WBC: 15.9 10*3/uL — AB (ref 4.0–10.5)

## 2015-04-13 LAB — APTT: APTT: 38 s — AB (ref 24–37)

## 2015-04-13 LAB — PROTIME-INR
INR: 1.43 (ref 0.00–1.49)
PROTHROMBIN TIME: 17.5 s — AB (ref 11.6–15.2)

## 2015-04-13 MED ORDER — MIDAZOLAM HCL 2 MG/2ML IJ SOLN
INTRAMUSCULAR | Status: AC | PRN
  Administered 2015-04-13: 0.5 mg via INTRAVENOUS

## 2015-04-13 MED ORDER — SODIUM CHLORIDE 0.9 % IV SOLN
Freq: Once | INTRAVENOUS | Status: AC
  Administered 2015-04-13: 09:00:00 via INTRAVENOUS

## 2015-04-13 MED ORDER — MIDAZOLAM HCL 2 MG/2ML IJ SOLN
INTRAMUSCULAR | Status: AC
  Filled 2015-04-13: qty 2

## 2015-04-13 MED ORDER — FENTANYL CITRATE (PF) 100 MCG/2ML IJ SOLN
INTRAMUSCULAR | Status: AC | PRN
Start: 2015-04-13 — End: 2015-04-13
  Administered 2015-04-13: 12.5 ug via INTRAVENOUS

## 2015-04-13 MED ORDER — FENTANYL CITRATE (PF) 100 MCG/2ML IJ SOLN
INTRAMUSCULAR | Status: AC
  Filled 2015-04-13: qty 2

## 2015-04-13 NOTE — Progress Notes (Signed)
Patient ID: John Halt., male   DOB: [birth year], 81 y.o.   MRN: [id number]    Referring Physician(s): Plotnikov,Aleksei V  Chief Complaint:  retroperitoneal/paraaortic adenopathy   Subjective:  Pt recently seen on 9/12 for CT guided RP/paraaortic LN biopsy; however his PT/INR were too elevated to proceed .(see note for additional details) He presents again today to undergo above procedure. PT/INR today is 17.5/1.43 and safe enough to proceed with biopsy. There have been no significant changes since last exam but pt is a little more drowsy today. He has no significant appetite per daughter and has lost some additional weight.   Allergies: Medications: Prior to Admission medications   Medication Sig Start Date End Date Taking? Authorizing Provider  amLODipine (NORVASC) 2.5 MG tablet Take 2.5 mg by mouth 2 (two) times daily.    Yes Historical Provider, MD  enoxaparin (LOVENOX) 80 MG/0.8ML injection Inject 80 mg into the skin 2 (two) times daily.   Yes Historical Provider, MD  Cholecalciferol (VITAMIN D) 1000 UNITS capsule Take 1,000 Units by mouth daily.      Historical Provider, MD  Diphenhydramine-APAP, sleep, (TYLENOL PM EXTRA STRENGTH PO) Take 1 capsule by mouth at bedtime.     Historical Provider, MD  ferrous sulfate 325 (65 FE) MG tablet Take 1 tablet (325 mg total) by mouth daily with breakfast. 12/23/14   Lew Dawes V, MD  fluocinonide (LIDEX) 0.05 % external solution APPLY ON THE SCALP DAILY 02/21/15   Historical Provider, MD  metoprolol succinate (TOPROL-XL) 50 MG 24 hr tablet Take 1 tablet (50 mg total) by mouth 2 (two) times daily. Take with or immediately following a meal. Patient taking differently: Take 50 mg by mouth 2 (two) times daily. Take with or immediately following a meal. 01/07/15   Evans Lance, MD  omeprazole (PRILOSEC) 40 MG capsule Take 40 mg by mouth daily.    Historical Provider, MD  polyethylene glycol (MIRALAX / GLYCOLAX) packet Take 17 g by mouth  at bedtime.     Historical Provider, MD  warfarin (COUMADIN) 2.5 MG tablet TAKE AS DIRECTED BY COUMADIN CLINIC Patient taking differently: Take one tablet by mouth daily 02/14/15   Evans Lance, MD     Vital Signs: BP 103/59 mmHg  Pulse 100  Temp(Src) 97.8 F (36.6 C) (Oral)  Resp 16  Ht 6' (1.829 m)  Wt 157 lb 12.8 oz (71.578 kg)  BMI 21.40 kg/m2  SpO2 98%  Physical Exam drowsy but alert, moves all fours ok; chest- CTA bilat; heart- ireg irreg; abd- soft,+BS,NT  Imaging: No results found.  Labs:  CBC:  Recent Labs  03/17/15 1715 03/28/15 1100 04/11/15 1235 04/13/15 0852  WBC 16.5* 15.2* 16.4* 15.9*  HGB 10.9* 10.1* 10.9* 10.3*  HCT 34.9* 34.1* 36.8* 34.8*  PLT 327.0 289 300 283    COAGS:  Recent Labs  03/15/15 1139 03/28/15 1100 04/11/15 1235 04/13/15 0852  INR 3.3 2.01* 1.83* 1.43  APTT  --   --  37 38*    BMP:  Recent Labs  12/22/14 1136 03/17/15 1715 03/28/15 1100 04/11/15 1235  NA 134* 137 133* 134*  K 4.4 4.9 4.4 4.4  CL 100 100 98* 100*  CO2 26 28 29 24   GLUCOSE 178* 133* 216* 113*  BUN 20 25* 21* 27*  CALCIUM 9.7 10.3 9.7 9.9  CREATININE 1.00 0.94 0.90 0.96  GFRNONAA  --   --  >60 >60  GFRAA  --   --  >60 >60  LIVER FUNCTION TESTS:  Recent Labs  05/13/14 1052 03/17/15 1715 03/28/15 1100  BILITOT 1.0 0.6 1.1  AST 20 71* 116*  ALT 15 24 44  ALKPHOS 86 187* 169*  PROT 7.5 8.1 6.9  ALBUMIN 3.1* 2.9* 2.1*    Assessment and Plan: John Brosch. is a 81 y.o. male with past medical history significant for hypertension, chronic atrial fibrillation, hyperlipidemia, prior TIA, prior MI. He has recently presented with tremors, weakness, incontinence, weight loss, anorexia and elevated LFT's. Recent CT of abdomen and pelvis on 03/24/15 has revealed multiple lymph nodes predominantly in the periaortic /pericaval and portacaval space suggestive of underlying lymphoma. He presents today for CT-guided biopsy of an enlarged  retroperitoneal/paraaortic lymph node.Risks and benefits discussed with the patient/family including, but not limited to bleeding, infection, damage to adjacent structures or low yield requiring additional tests.All of the patient's questions were answered, patient is agreeable to proceed.Consent signed and in chart.   Signed: D. Levii Hairfield 04/13/2015, 9:59 AM   I spent a total of 15 minutes at the the patient's bedside AND on the patient's hospital floor or unit, greater than 50% of which was counseling/coordinating care for CT guided RP/paraaortic lymph node biopsy

## 2015-04-13 NOTE — Procedures (Signed)
Interventional Radiology Procedure Note  Procedure: CT guided bx LEFT retroperitoneal LN.   Complications: None  Estimated Blood Loss: 0  Recommendations: - Bedrest x 1 hr - DC home - Path pending  Signed,  Criselda Peaches, MD

## 2015-04-13 NOTE — Discharge Instructions (Signed)
Biopsy  Care After  These instructions give you information on caring for yourself after your procedure. Your doctor may also give you more specific instructions. Call your doctor if you have any problems or questions after your procedure.  HOME CARE    Return to your normal diet and activities as told by your doctor.   Change your bandages (dressings) as told by your doctor. If skin glue (adhesive) was used, it will peel off in 7 days.   Only take medicines as told by your doctor.   Ask your doctor when you can bathe and get your wound wet.  GET HELP RIGHT AWAY IF:   You see more than a small spot of blood coming from the wound.   You have redness, puffiness (swelling), or pain.   You see yellowish-white fluid (pus) coming from the wound.   You have a fever.   You notice a bad smell coming from the wound or bandage.   You have a rash, trouble breathing, or any allergy problems.  MAKE SURE YOU:    Understand these instructions.   Will watch your condition.   Will get help right away if you are not doing well or get worse.  Document Released: 03/20/2011 Document Revised: 10/08/2011 Document Reviewed: 03/20/2011  ExitCare Patient Information 2015 [employer]. This information is not intended to replace advice given to you by your health care provider. Make sure you discuss any questions you have with your health care provider.      Conscious Sedation  Sedation is the use of medicines to promote relaxation and relieve discomfort and anxiety. Conscious sedation is a type of sedation. Under conscious sedation you are less alert than normal but are still able to respond to instructions or stimulation. Conscious sedation is used during short medical and dental procedures. It is milder than deep sedation or general anesthesia and allows you to return to your regular activities sooner.   LET YOUR HEALTH CARE PROVIDER KNOW ABOUT:    Any allergies you have.   All medicines you are taking, including vitamins,  herbs, eye drops, creams, and over-the-counter medicines.   Use of steroids (by mouth or creams).   Previous problems you or members of your family have had with the use of anesthetics.   Any blood disorders you have.   Previous surgeries you have had.   Medical conditions you have.   Possibility of pregnancy, if this applies.   Use of cigarettes, alcohol, or illegal drugs.  RISKS AND COMPLICATIONS  Generally, this is a safe procedure. However, as with any procedure, problems can occur. Possible problems include:   Oversedation.   Trouble breathing on your own. You may need to have a breathing tube until you are awake and breathing on your own.   Allergic reaction to any of the medicines used for the procedure.  BEFORE THE PROCEDURE   You may have blood tests done. These tests can help show how well your kidneys and liver are working. They can also show how well your blood clots.   A physical exam will be done.   Only take medicines as directed by your health care provider. You may need to stop taking medicines (such as blood thinners, aspirin, or nonsteroidal anti-inflammatory drugs) before the procedure.    Do not eat or drink at least 6 hours before the procedure or as directed by your health care provider.   Arrange for a responsible adult, family member, or friend to take you   home after the procedure. He or she should stay with you for at least 24 hours after the procedure, until the medicine has worn off.  PROCEDURE    An intravenous (IV) catheter will be inserted into one of your veins. Medicine will be able to flow directly into your body through this catheter. You may be given medicine through this tube to help prevent pain and help you relax.   The medical or dental procedure will be done.  AFTER THE PROCEDURE   You will stay in a recovery area until the medicine has worn off. Your blood pressure and pulse will be checked.    Depending on the procedure you had, you may be allowed to  go home when you can tolerate liquids and your pain is under control.  Document Released: 04/10/2001 Document Revised: 07/21/2013 Document Reviewed: 03/23/2013  ExitCare Patient Information 2015 [employer]. This information is not intended to replace advice given to you by your health care provider. Make sure you discuss any questions you have with your health care provider.

## 2015-04-17 ENCOUNTER — Emergency Department (HOSPITAL_COMMUNITY): Payer: Medicare Other

## 2015-04-17 ENCOUNTER — Encounter (HOSPITAL_COMMUNITY): Payer: Self-pay

## 2015-04-17 ENCOUNTER — Inpatient Hospital Stay (HOSPITAL_COMMUNITY)
Admission: EM | Admit: 2015-04-17 | Discharge: 2015-04-22 | DRG: 853 | Disposition: A | Discharge status: Skilled Nursing Facility | Payer: Medicare Other | Attending: Internal Medicine | Admitting: Internal Medicine

## 2015-04-17 ENCOUNTER — Encounter (HOSPITAL_COMMUNITY)
Admission: EM | Disposition: A | Discharge status: Skilled Nursing Facility | Payer: Self-pay | Source: Home / Self Care | Attending: Internal Medicine

## 2015-04-17 DIAGNOSIS — I951 Orthostatic hypotension: Secondary | ICD-10-CM | POA: Diagnosis not present

## 2015-04-17 DIAGNOSIS — K449 Diaphragmatic hernia without obstruction or gangrene: Secondary | ICD-10-CM | POA: Diagnosis present

## 2015-04-17 DIAGNOSIS — I4891 Unspecified atrial fibrillation: Secondary | ICD-10-CM | POA: Diagnosis present

## 2015-04-17 DIAGNOSIS — M109 Gout, unspecified: Secondary | ICD-10-CM | POA: Diagnosis present

## 2015-04-17 DIAGNOSIS — Z8711 Personal history of peptic ulcer disease: Secondary | ICD-10-CM

## 2015-04-17 DIAGNOSIS — I959 Hypotension, unspecified: Secondary | ICD-10-CM

## 2015-04-17 DIAGNOSIS — D72829 Elevated white blood cell count, unspecified: Secondary | ICD-10-CM | POA: Diagnosis present

## 2015-04-17 DIAGNOSIS — K922 Gastrointestinal hemorrhage, unspecified: Secondary | ICD-10-CM | POA: Diagnosis not present

## 2015-04-17 DIAGNOSIS — R578 Other shock: Secondary | ICD-10-CM | POA: Diagnosis present

## 2015-04-17 DIAGNOSIS — Z79899 Other long term (current) drug therapy: Secondary | ICD-10-CM | POA: Diagnosis not present

## 2015-04-17 DIAGNOSIS — Z8673 Personal history of transient ischemic attack (TIA), and cerebral infarction without residual deficits: Secondary | ICD-10-CM | POA: Diagnosis not present

## 2015-04-17 DIAGNOSIS — I482 Chronic atrial fibrillation: Secondary | ICD-10-CM

## 2015-04-17 DIAGNOSIS — K222 Esophageal obstruction: Secondary | ICD-10-CM | POA: Diagnosis present

## 2015-04-17 DIAGNOSIS — Z85820 Personal history of malignant melanoma of skin: Secondary | ICD-10-CM

## 2015-04-17 DIAGNOSIS — Z96642 Presence of left artificial hip joint: Secondary | ICD-10-CM | POA: Diagnosis present

## 2015-04-17 DIAGNOSIS — Z87891 Personal history of nicotine dependence: Secondary | ICD-10-CM

## 2015-04-17 DIAGNOSIS — Z961 Presence of intraocular lens: Secondary | ICD-10-CM | POA: Diagnosis present

## 2015-04-17 DIAGNOSIS — Z8371 Family history of colonic polyps: Secondary | ICD-10-CM

## 2015-04-17 DIAGNOSIS — K219 Gastro-esophageal reflux disease without esophagitis: Secondary | ICD-10-CM | POA: Diagnosis present

## 2015-04-17 DIAGNOSIS — Z823 Family history of stroke: Secondary | ICD-10-CM

## 2015-04-17 DIAGNOSIS — K269 Duodenal ulcer, unspecified as acute or chronic, without hemorrhage or perforation: Secondary | ICD-10-CM | POA: Diagnosis present

## 2015-04-17 DIAGNOSIS — A419 Sepsis, unspecified organism: Secondary | ICD-10-CM | POA: Diagnosis present

## 2015-04-17 DIAGNOSIS — D62 Acute posthemorrhagic anemia: Secondary | ICD-10-CM | POA: Diagnosis present

## 2015-04-17 DIAGNOSIS — E440 Moderate protein-calorie malnutrition: Secondary | ICD-10-CM | POA: Diagnosis present

## 2015-04-17 DIAGNOSIS — E875 Hyperkalemia: Secondary | ICD-10-CM | POA: Diagnosis present

## 2015-04-17 DIAGNOSIS — Z7901 Long term (current) use of anticoagulants: Secondary | ICD-10-CM | POA: Diagnosis not present

## 2015-04-17 DIAGNOSIS — Z951 Presence of aortocoronary bypass graft: Secondary | ICD-10-CM

## 2015-04-17 DIAGNOSIS — M1990 Unspecified osteoarthritis, unspecified site: Secondary | ICD-10-CM | POA: Diagnosis present

## 2015-04-17 DIAGNOSIS — Z955 Presence of coronary angioplasty implant and graft: Secondary | ICD-10-CM | POA: Diagnosis not present

## 2015-04-17 DIAGNOSIS — N390 Urinary tract infection, site not specified: Secondary | ICD-10-CM | POA: Diagnosis present

## 2015-04-17 DIAGNOSIS — L8991 Pressure ulcer of unspecified site, stage 1: Secondary | ICD-10-CM | POA: Diagnosis present

## 2015-04-17 DIAGNOSIS — L8990 Pressure ulcer of unspecified site, unspecified stage: Secondary | ICD-10-CM

## 2015-04-17 DIAGNOSIS — I252 Old myocardial infarction: Secondary | ICD-10-CM

## 2015-04-17 DIAGNOSIS — R599 Enlarged lymph nodes, unspecified: Secondary | ICD-10-CM | POA: Diagnosis present

## 2015-04-17 DIAGNOSIS — R32 Unspecified urinary incontinence: Secondary | ICD-10-CM | POA: Diagnosis present

## 2015-04-17 DIAGNOSIS — R590 Localized enlarged lymph nodes: Secondary | ICD-10-CM | POA: Diagnosis present

## 2015-04-17 DIAGNOSIS — I10 Essential (primary) hypertension: Secondary | ICD-10-CM | POA: Diagnosis present

## 2015-04-17 DIAGNOSIS — E785 Hyperlipidemia, unspecified: Secondary | ICD-10-CM | POA: Diagnosis present

## 2015-04-17 DIAGNOSIS — L89151 Pressure ulcer of sacral region, stage 1: Secondary | ICD-10-CM | POA: Diagnosis present

## 2015-04-17 DIAGNOSIS — R55 Syncope and collapse: Secondary | ICD-10-CM | POA: Diagnosis present

## 2015-04-17 DIAGNOSIS — D479 Neoplasm of uncertain behavior of lymphoid, hematopoietic and related tissue, unspecified: Secondary | ICD-10-CM | POA: Diagnosis not present

## 2015-04-17 DIAGNOSIS — Z881 Allergy status to other antibiotic agents status: Secondary | ICD-10-CM | POA: Diagnosis not present

## 2015-04-17 DIAGNOSIS — R591 Generalized enlarged lymph nodes: Secondary | ICD-10-CM

## 2015-04-17 DIAGNOSIS — K2970 Gastritis, unspecified, without bleeding: Secondary | ICD-10-CM | POA: Diagnosis present

## 2015-04-17 HISTORY — PX: ESOPHAGOGASTRODUODENOSCOPY: SHX5428

## 2015-04-17 LAB — CBC WITH DIFFERENTIAL/PLATELET
BASOS ABS: 0 10*3/uL (ref 0.0–0.1)
Basophils Relative: 0 %
EOS PCT: 1 %
Eosinophils Absolute: 0.2 10*3/uL (ref 0.0–0.7)
HEMATOCRIT: 26.6 % — AB (ref 39.0–52.0)
Hemoglobin: 8.4 g/dL — ABNORMAL LOW (ref 13.0–17.0)
LYMPHS ABS: 1.3 10*3/uL (ref 0.7–4.0)
Lymphocytes Relative: 7 %
MCH: 27.2 pg (ref 26.0–34.0)
MCHC: 31.6 g/dL (ref 30.0–36.0)
MCV: 86.1 fL (ref 78.0–100.0)
MONO ABS: 1.1 10*3/uL — AB (ref 0.1–1.0)
MONOS PCT: 6 %
NEUTROS ABS: 16.1 10*3/uL — AB (ref 1.7–7.7)
Neutrophils Relative %: 86 %
PLATELETS: 247 10*3/uL (ref 150–400)
RBC: 3.09 MIL/uL — AB (ref 4.22–5.81)
RDW: 16.7 % — AB (ref 11.5–15.5)
WBC: 18.7 10*3/uL — AB (ref 4.0–10.5)

## 2015-04-17 LAB — I-STAT CG4 LACTIC ACID, ED: Lactic Acid, Venous: 4.1 mmol/L (ref 0.5–2.0)

## 2015-04-17 LAB — URINALYSIS, ROUTINE W REFLEX MICROSCOPIC
Bilirubin Urine: NEGATIVE
Glucose, UA: NEGATIVE mg/dL
KETONES UR: NEGATIVE mg/dL
NITRITE: NEGATIVE
PH: 5 (ref 5.0–8.0)
Protein, ur: NEGATIVE mg/dL
SPECIFIC GRAVITY, URINE: 1.023 (ref 1.005–1.030)
Urobilinogen, UA: 1 mg/dL (ref 0.0–1.0)

## 2015-04-17 LAB — COMPREHENSIVE METABOLIC PANEL
ALBUMIN: 2 g/dL — AB (ref 3.5–5.0)
ALBUMIN: 1.9 g/dL — AB (ref 3.5–5.0)
ALK PHOS: 86 U/L (ref 38–126)
ALT: 14 U/L — ABNORMAL LOW (ref 17–63)
ALT: 11 U/L — AB (ref 17–63)
AST: 40 U/L (ref 15–41)
AST: 30 U/L (ref 15–41)
Alkaline Phosphatase: 71 U/L (ref 38–126)
Anion gap: 10 (ref 5–15)
Anion gap: 6 (ref 5–15)
BILIRUBIN TOTAL: 0.4 mg/dL (ref 0.3–1.2)
BILIRUBIN TOTAL: 1.3 mg/dL — AB (ref 0.3–1.2)
BUN: 58 mg/dL — AB (ref 6–20)
BUN: 50 mg/dL — AB (ref 6–20)
CALCIUM: 9.9 mg/dL (ref 8.9–10.3)
CO2: 24 mmol/L (ref 22–32)
CO2: 24 mmol/L (ref 22–32)
CREATININE: 0.81 mg/dL (ref 0.61–1.24)
Calcium: 9.2 mg/dL (ref 8.9–10.3)
Chloride: 106 mmol/L (ref 101–111)
Chloride: 113 mmol/L — ABNORMAL HIGH (ref 101–111)
Creatinine, Ser: 1.17 mg/dL (ref 0.61–1.24)
GFR calc Af Amer: >60 mL/min (ref >60)
GFR calc Af Amer: >60 mL/min (ref >60)
GFR calc non Af Amer: 53 mL/min — ABNORMAL LOW (ref >60)
GLUCOSE: 210 mg/dL — AB (ref 65–99)
GLUCOSE: 130 mg/dL — AB (ref 65–99)
POTASSIUM: 4.1 mmol/L (ref 3.5–5.1)
Potassium: 5.4 mmol/L — ABNORMAL HIGH (ref 3.5–5.1)
Sodium: 140 mmol/L (ref 135–145)
Sodium: 143 mmol/L (ref 135–145)
TOTAL PROTEIN: 6.6 g/dL (ref 6.5–8.1)
TOTAL PROTEIN: 5.8 g/dL — AB (ref 6.5–8.1)

## 2015-04-17 LAB — PROTIME-INR
INR: 2.05 — ABNORMAL HIGH (ref 0.00–1.49)
INR: 2.16 — ABNORMAL HIGH (ref 0.00–1.49)
Prothrombin Time: 23 seconds — ABNORMAL HIGH (ref 11.6–15.2)
Prothrombin Time: 23.9 seconds — ABNORMAL HIGH (ref 11.6–15.2)

## 2015-04-17 LAB — URINE MICROSCOPIC-ADD ON

## 2015-04-17 LAB — LACTIC ACID, PLASMA
Lactic Acid, Venous: 2.1 mmol/L (ref 0.5–2.0)
Lactic Acid, Venous: 1.1 mmol/L (ref 0.5–2.0)

## 2015-04-17 LAB — PHOSPHORUS: Phosphorus: 3 mg/dL (ref 2.5–4.6)

## 2015-04-17 LAB — LIPASE, BLOOD: Lipase: 30 U/L (ref 22–51)

## 2015-04-17 LAB — CBC
HCT: 24.8 % — ABNORMAL LOW (ref 39.0–52.0)
Hemoglobin: 7.4 g/dL — ABNORMAL LOW (ref 13.0–17.0)
MCH: 26.1 pg (ref 26.0–34.0)
MCHC: 29.8 g/dL — AB (ref 30.0–36.0)
MCV: 87.6 fL (ref 78.0–100.0)
Platelets: 301 10*3/uL (ref 150–400)
RBC: 2.83 MIL/uL — ABNORMAL LOW (ref 4.22–5.81)
RDW: 17 % — AB (ref 11.5–15.5)
WBC: 23.2 10*3/uL — ABNORMAL HIGH (ref 4.0–10.5)

## 2015-04-17 LAB — TSH: TSH: 1.302 u[IU]/mL (ref 0.350–4.500)

## 2015-04-17 LAB — MAGNESIUM: Magnesium: 1.7 mg/dL (ref 1.7–2.4)

## 2015-04-17 LAB — MRSA PCR SCREENING: MRSA by PCR: NEGATIVE

## 2015-04-17 LAB — PROCALCITONIN: PROCALCITONIN: 1.87 ng/mL

## 2015-04-17 LAB — PREPARE RBC (CROSSMATCH)

## 2015-04-17 LAB — APTT: aPTT: 47 seconds — ABNORMAL HIGH (ref 24–37)

## 2015-04-17 LAB — POC OCCULT BLOOD, ED: FECAL OCCULT BLD: POSITIVE — AB

## 2015-04-17 LAB — ABO/RH: ABO/RH(D): A POS

## 2015-04-17 SURGERY — EGD (ESOPHAGOGASTRODUODENOSCOPY)
Anesthesia: Moderate Sedation | Laterality: Left

## 2015-04-17 MED ORDER — SODIUM CHLORIDE 0.9 % IV SOLN
1000.0000 mL | Freq: Once | INTRAVENOUS | Status: AC
  Administered 2015-04-17: 1000 mL via INTRAVENOUS

## 2015-04-17 MED ORDER — ACETAMINOPHEN 325 MG PO TABS
650.0000 mg | ORAL_TABLET | Freq: Four times a day (QID) | ORAL | Status: DC | PRN
Start: 2015-04-17 — End: 2015-04-22
  Filled 2015-04-17: qty 2

## 2015-04-17 MED ORDER — VANCOMYCIN HCL 10 G IV SOLR
1250.0000 mg | INTRAVENOUS | Status: DC
  Administered 2015-04-18: 1250 mg via INTRAVENOUS
  Filled 2015-04-17 (×2): qty 1250

## 2015-04-17 MED ORDER — SODIUM CHLORIDE 0.9 % IV SOLN: 10.0000 mL/h | Freq: Once | INTRAVENOUS | Status: DC

## 2015-04-17 MED ORDER — SODIUM CHLORIDE 0.9 % IV SOLN
INTRAVENOUS | Status: DC
  Administered 2015-04-18 – 2015-04-20 (×4): via INTRAVENOUS

## 2015-04-17 MED ORDER — VANCOMYCIN HCL IN DEXTROSE 1-5 GM/200ML-% IV SOLN: 1000.0000 mg | Freq: Once | INTRAVENOUS | Status: DC

## 2015-04-17 MED ORDER — ONDANSETRON HCL 4 MG PO TABS: 4.0000 mg | ORAL_TABLET | Freq: Four times a day (QID) | ORAL | Status: DC | PRN

## 2015-04-17 MED ORDER — PIPERACILLIN-TAZOBACTAM 3.375 G IVPB 30 MIN
3.3750 g | Freq: Once | INTRAVENOUS | Status: AC
  Administered 2015-04-17: 3.375 g via INTRAVENOUS
  Filled 2015-04-17: qty 50

## 2015-04-17 MED ORDER — DIPHENHYDRAMINE HCL 50 MG/ML IJ SOLN
INTRAMUSCULAR | Status: AC
  Filled 2015-04-17: qty 1

## 2015-04-17 MED ORDER — PIPERACILLIN-TAZOBACTAM 3.375 G IVPB
3.3750 g | Freq: Three times a day (TID) | INTRAVENOUS | Status: DC
  Administered 2015-04-17 – 2015-04-19 (×5): 3.375 g via INTRAVENOUS
  Filled 2015-04-17 (×3): qty 50

## 2015-04-17 MED ORDER — ACETAMINOPHEN 650 MG RE SUPP
650.0000 mg | Freq: Four times a day (QID) | RECTAL | Status: DC | PRN
Start: 2015-04-17 — End: 2015-04-22

## 2015-04-17 MED ORDER — VANCOMYCIN HCL 10 G IV SOLR
1250.0000 mg | Freq: Once | INTRAVENOUS | Status: AC
  Administered 2015-04-17: 1250 mg via INTRAVENOUS
  Filled 2015-04-17: qty 1250

## 2015-04-17 MED ORDER — BUTAMBEN-TETRACAINE-BENZOCAINE 2-2-14 % EX AERO
INHALATION_SPRAY | CUTANEOUS | Status: DC | PRN
  Administered 2015-04-17: 2 via TOPICAL

## 2015-04-17 MED ORDER — ONDANSETRON HCL 4 MG/2ML IJ SOLN: 4.0000 mg | Freq: Three times a day (TID) | INTRAMUSCULAR | Status: DC | PRN

## 2015-04-17 MED ORDER — MIDAZOLAM HCL 10 MG/2ML IJ SOLN
INTRAMUSCULAR | Status: DC | PRN
  Administered 2015-04-17: 2 mg via INTRAVENOUS
  Administered 2015-04-17: 1 mg via INTRAVENOUS

## 2015-04-17 MED ORDER — SODIUM CHLORIDE 0.9 % IV SOLN
1000.0000 mL | INTRAVENOUS | Status: DC
  Administered 2015-04-17: 1000 mL via INTRAVENOUS

## 2015-04-17 MED ORDER — MIDAZOLAM HCL 5 MG/ML IJ SOLN
INTRAMUSCULAR | Status: AC
  Filled 2015-04-17: qty 2

## 2015-04-17 MED ORDER — ONDANSETRON HCL 4 MG/2ML IJ SOLN: 4.0000 mg | Freq: Four times a day (QID) | INTRAMUSCULAR | Status: DC | PRN

## 2015-04-17 MED ORDER — FENTANYL CITRATE (PF) 100 MCG/2ML IJ SOLN
INTRAMUSCULAR | Status: AC
  Filled 2015-04-17: qty 2

## 2015-04-17 MED ORDER — FENTANYL CITRATE (PF) 100 MCG/2ML IJ SOLN
INTRAMUSCULAR | Status: DC | PRN
  Administered 2015-04-17: 12.5 ug via INTRAVENOUS

## 2015-04-17 MED ORDER — SODIUM CHLORIDE 0.9 % IV SOLN: INTRAVENOUS | Status: DC

## 2015-04-17 MED ORDER — PANTOPRAZOLE SODIUM 40 MG IV SOLR
40.0000 mg | Freq: Two times a day (BID) | INTRAVENOUS | Status: DC
  Administered 2015-04-17 – 2015-04-20 (×7): 40 mg via INTRAVENOUS
  Filled 2015-04-17 (×7): qty 40

## 2015-04-17 MED ORDER — SODIUM CHLORIDE 0.9 % IJ SOLN
3.0000 mL | Freq: Two times a day (BID) | INTRAMUSCULAR | Status: DC
Start: 2015-04-17 — End: 2015-04-22
  Administered 2015-04-17 – 2015-04-20 (×5): 3 mL via INTRAVENOUS

## 2015-04-17 NOTE — ED Notes (Signed)
Dr Audie Pinto made aware of abnormal lactic acid.

## 2015-04-17 NOTE — ED Notes (Signed)
Spoke with MD, pt is not sepsis.

## 2015-04-17 NOTE — ED Notes (Signed)
Bed: RESA Expected date:  Expected time:  Means of arrival:  Comments: Endoscopy

## 2015-04-17 NOTE — Op Note (Signed)
[facility] [place], [id number]   ENDOSCOPY PROCEDURE REPORT  PATIENT: John [name], John [name]  MR#: [id number] BIRTHDATE: [birth year] , 89  yrs. old GENDER: male ENDOSCOPIST: Teena Irani, MD REFERRED BY: PROCEDURE DATE:  04/17/2015 PROCEDURE: ASA CLASS: INDICATIONS:  melenic stools. MEDICATIONS: 3 mg Versed 12 and have marked grams IV fentanyl  TOPICAL ANESTHETIC: Cetacaine spray.  DESCRIPTION OF PROCEDURE: After the risks benefits and alternatives of the procedure were thoroughly explained, informed consent was obtained.  The Pentax Gastroscope [id number] endoscope was introduced through the mouth and advanced to the second portion of the duodenum , Without limitations.  The instrument was slowly withdrawn as the mucosa was fully examined.    esophagus: Widely patent lower esophageal ring with small hiatal hernia otherwise normal.      stomach: Diffuse hypertrophy of the rugal folds. No obvious mucosal inflammation friability ulcers or erosions. retroflexion unremarkable except for hypertrophied proximal gastric folds. CLOtest obtained.Pylorus normal.  Duodenum: 40mm distal bulb ulcer with clean base with rim of erythematous mucosa with contact hemorrhage, stopped spontaneously. No old or fresh blood otherwise seen in the duodenum.          The scope was then withdrawn from the patient and the procedure completed.  COMPLICATIONS: There were no immediate complications.  ENDOSCOPIC IMPRESSION: duodenal ulcer, likely causing recent bleeding but with only mild stigmata of hemorrhage. Proximal gastritis Esophageal ring with small hiatal hernia.  RECOMMENDATIONS: #1. IV proton pump inhibitor #2. Reverse Coumadin #3. Transfusion of packed red blood cells as needed. 4. Await CLOtest #5. Clear liquid diet.  REPEAT EXAM:  eSigned:  Teena Irani, MD 04/17/2015 1:55 PM    CC:  PATIENT NAME:  Cloyde, Oregel MR#: [id number]

## 2015-04-17 NOTE — H&P (Signed)
[facility] History and Physical  Lequita Halt. IOE:[id number] DOB: April 06, 1924 DOA: 04/17/2015  Referring physician: ER physician: Dr. Leonard Schwartz  PCP: Walker Kehr, MD  Chief Complaint: syncope, dark stool  HPI:  81 year old male with past medical history of atrial fibrillation (on anticoagulation with coumadin), hypertension, GERD who presented from [facility] SNF to [facility] ED status post syncopal event the day prior to this admission. Patient has had reports of dark and melanotic stool for past few days prior to this admission. He has had recent biopsy for what was thought to be lymphoma. No reports of abdominal pain, no nausea or vomiting. No chest pain, shortness of breath or palpitations. No blood in urine. No fever, chills, cough. No reports of weakness at this time but he did say he felt week for some time now. No reports of dizziness at this time.   In ED, BP was 83/48 and he is receiving IV fluids. His HR is 114-129, RR 21-30, T max 99.3 F and oxygen saturation 98% on room air. Blood work is notable for WBC count of 23.2, hemoglobin os 7.4, normal platelets. Potassium was 5.4, INR 2.05. CXR showed no acute cardiopulmonary findings. GI will see the pt in consultation. He will be admitted to SDU.   Assessment & Plan    Principal Problem: Hemorrhagic shock / Acute upper GI hemorrhage / Acute blood loss anemia in the setting of anticoagulation - Pt presented with dark stool concerning for upper GI bleed in the setting of anticoagulation - Hemorrhagic shock suspected due to acute bleed and hypotension and elevated lactic acid - Admission to SDU - GI consulted - 2 Units PRBC ordered for transfusion in ED - Started on IV protonix Q 12 hours - Continue supportive care with IV fluids - Stop coumadin, lovenox  Active Problems: Sepsis / Leukocytosis - Sepsis criteria met on admission with tachycardia, tachypnea, hypotension, leukocytosis, elevated lactic acid - Sepsis  work up appreciated - Obtain blood and urine cultures, urinalysis  - Repeat lactic acid level - Obtain procalcitonin level - Started vanco and zosyn empirically - Continue IV fluids for hypotension. He does not require pressor support at this time  Syncope - Likely in the setting of acute hemorrhagic shock and hypotension - Will continue to monitor on telemetry - BP 89/45 - Continue IV fluids - Bed rest     Atrial fibrillation / On anticoagulant therapy - CHADS vasc score 4 - On anticoagulation with coumadin but this is on hold due to acute hemorrhagic shock  - Rate controlled with metoprolol but it is on hold for now due to hypotension     Benign essential HTN - Now hypotensive so all BP meds on hold     Hyperkalemia - Likely due to sepsis - Repeat amdission labs   Retroperitoneal lymphadenopathy /  Lymph nodes enlarged - Based on recet CT abdomen he was found to have bulky lymphadenopathy - Underwent recent biopsy - results still pending     DVT prophylaxis:  - Anticoagulation on hold due to risk of bleed - Use SCD's bilaterally   Radiological Exams on Admission: Dg Chest Portable 1 View  04/17/2015   CLINICAL DATA:  GI bleeding and hypotension  EXAM: PORTABLE CHEST - 1 VIEW  COMPARISON:  03/17/2015  FINDINGS: Mild to moderate cardiac enlargement with uncoiling and calcification of the aorta. Vascular pattern normal. No consolidation or effusion.  IMPRESSION: No active disease.   Electronically Signed   By: Kyung Rudd  Rubner M.D.   On: 04/17/2015 09:31     Code Status: Full Family Communication: Family not at the bedside  Disposition Plan: Admit for further evaluation, SDU due to hemorrhagic shock and sepsis   Leisa Lenz, MD  [facility] Pager [phone]  Time spent in minutes: 75 minutes  Review of Systems:  Constitutional: Negative for fever, chills and malaise/fatigue. Negative for diaphoresis.  HENT: Negative for hearing loss, ear pain, nosebleeds,  congestion, sore throat, neck pain, tinnitus and ear discharge.   Eyes: Negative for blurred vision, double vision, photophobia, pain, discharge and redness.  Respiratory: Negative for cough, hemoptysis, sputum production, shortness of breath, wheezing and stridor.   Cardiovascular: Negative for chest pain, palpitations, orthopnea, claudication and leg swelling.  Gastrointestinal: Negative for nausea, vomiting and abdominal pain. Negative for heartburn, constipation, positive for dark stool Genitourinary: Negative for dysuria, urgency, frequency, hematuria and flank pain.  Musculoskeletal: Negative for myalgias, back pain, joint pain and falls.  Skin: Negative for itching and rash.  Neurological: per HPI Endo/Heme/Allergies: Negative for environmental allergies and polydipsia. Does not bruise/bleed easily.  Psychiatric/Behavioral: Negative for suicidal ideas. The patient is not nervous/anxious.      Past Medical History  Diagnosis Date  . Anemia     low blood platelets  . Arthritis   . Blood transfusion   . Cancer     skin CA  . Cataract   . Heart murmur   . Hyperlipidemia   . Hypertension   . TIA (transient ischemic attack)   . Atrial fibrillation     on Coumadin  . Myocardial infarction     after colon surgery   Past Surgical History  Procedure Laterality Date  . Colon surgery      partial colectomy  . Total hip arthroplasty      LEFT HIP  . Coronary artery bypass graft      3 vessels  . Heart stent    . Thoracotomy    . Lens implants    . Colon surgery      right   Social History:  reports that he quit smoking about 49 years ago. He does not have any smokeless tobacco history on file. He reports that he drinks alcohol. His drug history is not on file.  Allergies  Allergen Reactions  . Polysporin [Bacitracin-Polymyxin B] Other (See Comments)    Doesn't remember reaction    Family History:  Family History  Problem Relation Age of Onset  . Colon polyps Mother    . Stroke Father   . Stroke Other      Prior to Admission medications   Medication Sig Start Date End Date Taking? Authorizing Provider  amLODipine (NORVASC) 2.5 MG tablet Take 2.5 mg by mouth 2 (two) times daily.    Yes Historical Provider, MD  Cholecalciferol (VITAMIN D) 1000 UNITS capsule Take 1,000 Units by mouth daily.     Yes Historical Provider, MD  enoxaparin (LOVENOX) 80 MG/0.8ML injection Inject 80 mg into the skin 2 (two) times daily. Until INR 2-3   Yes Historical Provider, MD  ferrous sulfate 325 (65 FE) MG tablet by mouth daily with breakfast. 12/23/14  Yes Aleksei Plotnikov V, MD  metoprolol succinate 50 MG 24 hr tablet  2 (two) times daily.  01/07/15  Yes Evans Lance, MD  omeprazole (PRILOSEC) 40 MG capsule Take 40 mg by mouth daily.   Yes Historical Provider, MD  polyethylene glycol (MIRALAX / GLYCOLAX) packet Take 17 g by mouth at  bedtime.    Yes Historical Provider, MD  warfarin (COUMADIN) 2.5 MG tablet Take one tablet by mouth daily 02/14/15  Yes Evans Lance, MD   Physical Exam: Filed Vitals:   04/17/15 2878 04/17/15 0855 04/17/15 0925  BP: 83/48 89/45   Pulse: 129 114   Temp: 97.6 F (36.4 C)  99.3 F (37.4 C)  TempSrc: Oral  Rectal  Resp: 21 30   SpO2: 98% 98%     Physical Exam  Constitutional: Appears well-developed and well-nourished. No distress.  HENT: Normocephalic. No tonsillar erythema or exudates Eyes: Conjunctivae are normal. No scleral icterus.  Neck: Normal ROM. Neck supple. No JVD. No tracheal deviation. No thyromegaly.  CVS: RRR, S1/S2 +, no murmurs, no gallops, no carotid bruit.  Pulmonary: Effort and breath sounds normal, no stridor, rhonchi, wheezes, rales.  Abdominal: Soft. BS +,  no distension, tenderness, rebound or guarding.  Musculoskeletal: Normal range of motion. No edema and no tenderness.  Lymphadenopathy: No lymphadenopathy noted, cervical, inguinal. Neuro: Alert. Normal reflexes, muscle tone coordination. No focal neurologic  deficits. Skin: Skin is warm and dry. No rash noted.  No erythema. No pallor.  Psychiatric: Normal mood and affect. Behavior, judgment, thought content normal.   Labs on Admission:  Basic Metabolic Panel:  Recent Labs Lab 04/11/15 1235 04/17/15 0853  NA 134* 140  K 4.4 5.4*  CL 100* 106  CO2 24 24  GLUCOSE 113* 210*  BUN 27* 58*  CREATININE 0.96 1.17  CALCIUM 9.9 9.9   Liver Function Tests:  Recent Labs Lab 04/17/15 0853  AST 40  ALT 14*  ALKPHOS 86  BILITOT 0.4  PROT 6.6  ALBUMIN 2.0*    Recent Labs Lab 04/17/15 0853  LIPASE 30   No results for input(s): AMMONIA in the last 168 hours. CBC:  Recent Labs Lab 04/11/15 1235 04/13/15 0852 04/17/15 0853  WBC 16.4* 15.9* 23.2*  NEUTROABS 13.8*  --   --   HGB 10.9* 10.3* 7.4*  HCT 36.8* 34.8* 24.8*  MCV 87.0 87.2 87.6  PLT 300 283 301   Cardiac Enzymes: No results for input(s): CKTOTAL, CKMB, CKMBINDEX, TROPONINI in the last 168 hours. BNP: Invalid input(s): POCBNP CBG: No results for input(s): GLUCAP in the last 168 hours.  If 7PM-7AM, please contact night-coverage [email or web address] Password [email or web address] 04/17/2015, 10:14 AM

## 2015-04-17 NOTE — ED Notes (Signed)
Antibiotics delayed d/t loss of IV.  Multiple sticks to resume.  Blood continued in running IV.

## 2015-04-17 NOTE — Progress Notes (Signed)
Report called to Jana Half (pts primary care RN in the ED)

## 2015-04-17 NOTE — Progress Notes (Signed)
[facility] for Vancomycin & Zosyn Indication: rule out sepsis  Allergies  Allergen Reactions  . Polysporin [Bacitracin-Polymyxin B] Other (See Comments)    Doesn't remember reaction    Patient Measurements:    Vital Signs: Temp: 99.3 F (37.4 C) (09/18 0925) Temp Source: Rectal (09/18 0925) BP: 89/45 mmHg (09/18 0855) Pulse Rate: 114 (09/18 0855) Intake/Output from previous day:   Intake/Output from this shift:    Labs:  Recent Labs  04/17/15 0853  WBC 23.2*  HGB 7.4*  PLT 301  CREATININE 1.17   Estimated Creatinine Clearance: 43.3 mL/min (by C-G formula based on Cr of 1.17). No results for input(s): VANCOTROUGH, VANCOPEAK, VANCORANDOM, GENTTROUGH, GENTPEAK, GENTRANDOM, TOBRATROUGH, TOBRAPEAK, TOBRARND, AMIKACINPEAK, AMIKACINTROU, AMIKACIN in the last 72 hours.   Microbiology: No results found for this or any previous visit (from the past 720 hour(s)).  Anti-infectives    Start     Dose/Rate Route Frequency Ordered Stop   04/17/15 1015  piperacillin-tazobactam (ZOSYN) IVPB 3.375 g     3.375 g 100 mL/hr over 30 Minutes Intravenous  Once 04/17/15 1006     04/17/15 1015  vancomycin (VANCOCIN) IVPB 1000 mg/200 mL premix     1,000 mg 200 mL/hr over 60 Minutes Intravenous  Once 04/17/15 1006        Assessment: 81 yo M presented from NH with syncope.  He is on chronic anticoagulation for Afib (INR 2.05 on admit) currently on Lovenox -->Coumadin PTA d/t Coumadin being held for biopsy 9/12.   He is admitted with GI Bleed, hemorrhagic shock, and possible sepsis.  He is afebrile, but has noted leukocytosis (WBC 23.2) and elevated lactic acid level (4.1).  Pharmacy is asked to dose empiric antibiotics pending cx data.  Scr is mildly elevated above patient's baseline.  Estimated CrCl ~ 40-66ml/min.   9/18 >>Zosyn  >> 9/18>>Vanc  >>    9/18 blood x2: 9/18 urine:   Dose changes/levels:  Goal of Therapy:  Vancomycin trough level 15-20  mcg/ml  Plan:  Zosyn 3.375gm IV Q8h to be infused over 4hrs Vancomycin 1250mg  IV q24h Check Vancomycin trough at steady state Monitor renal function and cx data   Biagio Borg 04/17/2015,10:17 AM

## 2015-04-17 NOTE — ED Notes (Signed)
He comes to [place] from [facility], where he was observed to have a syncopal episode last night.  He had another brief syncope this morning after passing a dark stool.  He is in no distress, with his skin being pale cool and dry and he is breathing normally.  He states he recently had some sort of "biopsy--they think I have lymphoma".

## 2015-04-17 NOTE — ED Provider Notes (Signed)
CSN: [id number]     Arrival date & time 04/17/15  6568 History   First MD Initiated Contact with Patient 04/17/15 [phone]     Chief Complaint  Patient presents with  . Melena  . Loss of Consciousness     HPI  Expand All Collapse All   He comes to [place] from [facility], where he was observed to have a syncopal episode last night. He had another brief syncope this morning after passing a dark stool. He is in no distress, with his skin being pale cool and dry and he is breathing normally. He states he recently had some sort of "biopsy--they think I have lymphoma        Past Medical History  Diagnosis Date  . Anemia     low blood platelets  . Arthritis   . Blood transfusion   . Cancer     skin CA  . Cataract   . Heart murmur   . Hyperlipidemia   . Hypertension   . TIA (transient ischemic attack)   . Atrial fibrillation     on Coumadin  . Myocardial infarction     after colon surgery   Past Surgical History  Procedure Laterality Date  . Colon surgery      partial colectomy  . Total hip arthroplasty      LEFT HIP  . Coronary artery bypass graft      3 vessels  . Heart stent    . Thoracotomy    . Lens implants    . Colon surgery      right  . Esophagogastroduodenoscopy Left 04/17/2015    Procedure: ESOPHAGOGASTRODUODENOSCOPY (EGD);  Surgeon: Teena Irani, MD;  Location: [facility];  Service: Endoscopy;  Laterality: Left;   Family History  Problem Relation Age of Onset  . Colon polyps Mother   . Stroke Father   . Stroke Other    Social History  Substance Use Topics  . Smoking status: Former Smoker    Quit date: 10/28/1965  . Smokeless tobacco: None  . Alcohol Use: 0.0 oz/week    Review of Systems  Unable to perform ROS: Acuity of condition      Allergies  Polysporin  Home Medications   Prior to Admission medications   Medication Sig Start Date End Date Taking? Authorizing Provider  amLODipine (NORVASC) 2.5 MG tablet Take 2.5 mg by mouth 2 (two) times  daily.    Yes Historical Provider, MD  Cholecalciferol (VITAMIN D) 1000 UNITS capsule Take 1,000 Units by mouth daily.     Yes Historical Provider, MD  Diphenhydramine-APAP, sleep, (TYLENOL PM EXTRA STRENGTH PO) Take 1 capsule by mouth at bedtime.    Yes Historical Provider, MD  enoxaparin (LOVENOX) 80 MG/0.8ML injection Inject 80 mg into the skin 2 (two) times daily. Until INR 2-3   Yes Historical Provider, MD  ferrous sulfate 325 (65 FE) MG tablet Take 1 tablet (325 mg total) by mouth daily with breakfast. 12/23/14  Yes Aleksei Plotnikov V, MD  fluocinonide (LIDEX) 0.05 % external solution APPLY ON THE SCALP DAILY 02/21/15  Yes Historical Provider, MD  metoprolol succinate (TOPROL-XL) 50 MG 24 hr tablet Take 1 tablet (50 mg total) by mouth 2 (two) times daily. Take with or immediately following a meal. Patient taking differently: Take 50 mg by mouth 2 (two) times daily.  01/07/15  Yes Evans Lance, MD  omeprazole (PRILOSEC) 40 MG capsule Take 40 mg by mouth daily.   Yes Historical Provider, MD  polyethylene glycol (MIRALAX / GLYCOLAX) packet Take 17 g by mouth at bedtime.    Yes Historical Provider, MD  warfarin (COUMADIN) 2.5 MG tablet TAKE AS DIRECTED BY COUMADIN CLINIC Patient taking differently: Take one tablet by mouth daily 02/14/15  Yes Evans Lance, MD   BP 124/76 mmHg  Pulse 102  Temp(Src) 98.4 F (36.9 C) (Oral)  Resp 20  Ht 5\' 11"  (1.803 m)  Wt 174 lb 1.6 oz (78.971 kg)  BMI 24.29 kg/m2  SpO2 96% Physical Exam  Constitutional: He is oriented to person, place, and time. He appears well-developed and well-nourished. No distress.  HENT:  Head: Normocephalic and atraumatic.  Eyes: Pupils are equal, round, and reactive to light.  Neck: Normal range of motion.  Cardiovascular: Normal rate and intact distal pulses.   Pulmonary/Chest: No respiratory distress.  Abdominal: Normal appearance. He exhibits no distension.  Musculoskeletal: Normal range of motion.  Neurological: He is  alert and oriented to person, place, and time. No cranial nerve deficit.  Skin: Skin is warm and dry. No rash noted. There is pallor.  Psychiatric: He has a normal mood and affect. His behavior is normal.  Nursing note and vitals reviewed.   ED Course  Procedures (including critical care time) CRITICAL CARE Performed by: Leonard Schwartz L Total critical care time: 30 min Critical care time was exclusive of separately billable procedures and treating other patients. Critical care was necessary to treat or prevent imminent or life-threatening deterioration. Critical care was time spent personally by me on the following activities: development of treatment plan with patient and/or surrogate as well as nursing, discussions with consultants, evaluation of patient's response to treatment, examination of patient, obtaining history from patient or surrogate, ordering and performing treatments and interventions, ordering and review of laboratory studies, ordering and review of radiographic studies, pulse oximetry and re-evaluation of patient's condition.  Labs Review Labs Reviewed  PROTIME-INR - Abnormal; Notable for the following:    Prothrombin Time 23.0 (*)    INR 2.05 (*)    All other components within normal limits  COMPREHENSIVE METABOLIC PANEL - Abnormal; Notable for the following:    Potassium 5.4 (*)    Glucose, Bld 210 (*)    BUN 58 (*)    Albumin 2.0 (*)    ALT 14 (*)    GFR calc non Af Amer 53 (*)    All other components within normal limits  CBC - Abnormal; Notable for the following:    WBC 23.2 (*)    RBC 2.83 (*)    Hemoglobin 7.4 (*)    HCT 24.8 (*)    MCHC 29.8 (*)    RDW 17.0 (*)    All other components within normal limits  URINALYSIS, ROUTINE W REFLEX MICROSCOPIC (NOT AT [facility]) - Abnormal; Notable for the following:    APPearance CLOUDY (*)    Hgb urine dipstick MODERATE (*)    Leukocytes, UA SMALL (*)    All other components within normal limits  LACTIC ACID, PLASMA  - Abnormal; Notable for the following:    Lactic Acid, Venous 2.1 (*)    All other components within normal limits  COMPREHENSIVE METABOLIC PANEL - Abnormal; Notable for the following:    Chloride 113 (*)    Glucose, Bld 130 (*)    BUN 50 (*)    Total Protein 5.8 (*)    Albumin 1.9 (*)    ALT 11 (*)    Total Bilirubin 1.3 (*)    All  other components within normal limits  CBC WITH DIFFERENTIAL/PLATELET - Abnormal; Notable for the following:    WBC 18.7 (*)    RBC 3.09 (*)    Hemoglobin 8.4 (*)    HCT 26.6 (*)    RDW 16.7 (*)    Neutro Abs 16.1 (*)    Monocytes Absolute 1.1 (*)    All other components within normal limits  APTT - Abnormal; Notable for the following:    aPTT 47 (*)    All other components within normal limits  PROTIME-INR - Abnormal; Notable for the following:    Prothrombin Time 23.9 (*)    INR 2.16 (*)    All other components within normal limits  BASIC METABOLIC PANEL - Abnormal; Notable for the following:    Chloride 113 (*)    Glucose, Bld 130 (*)    BUN 45 (*)    All other components within normal limits  CBC - Abnormal; Notable for the following:    WBC 18.8 (*)    RBC 3.17 (*)    Hemoglobin 8.4 (*)    HCT 27.0 (*)    RDW 17.0 (*)    All other components within normal limits  APTT - Abnormal; Notable for the following:    aPTT 54 (*)    All other components within normal limits  PROTIME-INR - Abnormal; Notable for the following:    Prothrombin Time 25.8 (*)    INR 2.39 (*)    All other components within normal limits  URINE MICROSCOPIC-ADD ON - Abnormal; Notable for the following:    Crystals URIC ACID CRYSTALS (*)    All other components within normal limits  GLUCOSE, CAPILLARY - Abnormal; Notable for the following:    Glucose-Capillary 117 (*)    All other components within normal limits  CBC - Abnormal; Notable for the following:    WBC 15.2 (*)    RBC 2.81 (*)    Hemoglobin 7.4 (*)    HCT 24.4 (*)    RDW 17.1 (*)    All other  components within normal limits  BASIC METABOLIC PANEL - Abnormal; Notable for the following:    Chloride 113 (*)    Glucose, Bld 133 (*)    BUN 29 (*)    All other components within normal limits  GLUCOSE, CAPILLARY - Abnormal; Notable for the following:    Glucose-Capillary 135 (*)    All other components within normal limits  GLUCOSE, CAPILLARY - Abnormal; Notable for the following:    Glucose-Capillary 136 (*)    All other components within normal limits  CBC WITH DIFFERENTIAL/PLATELET - Abnormal; Notable for the following:    WBC 15.4 (*)    RBC 3.59 (*)    Hemoglobin 9.7 (*)    HCT 31.6 (*)    RDW 17.2 (*)    Neutro Abs 12.7 (*)    Monocytes Absolute 1.2 (*)    All other components within normal limits  GLUCOSE, CAPILLARY - Abnormal; Notable for the following:    Glucose-Capillary 104 (*)    All other components within normal limits  I-STAT CG4 LACTIC ACID, ED - Abnormal; Notable for the following:    Lactic Acid, Venous 4.10 (*)    All other components within normal limits  POC OCCULT BLOOD, ED - Abnormal; Notable for the following:    Fecal Occult Bld POSITIVE (*)    All other components within normal limits  URINE CULTURE  CULTURE, BLOOD (ROUTINE X 2)  CULTURE, BLOOD (  ROUTINE X 2)  MRSA PCR SCREENING  LIPASE, BLOOD  PROCALCITONIN  LACTIC ACID, PLASMA  CLOTEST (H. PYLORI), BIOPSY  MAGNESIUM  PHOSPHORUS  TSH  TYPE AND SCREEN  PREPARE RBC (CROSSMATCH)  ABO/RH  PREPARE RBC (CROSSMATCH)    Imaging Review Dg Chest Port 1 View  04/20/2015   CLINICAL DATA:  Evaluate PICC line placement  EXAM: PORTABLE CHEST - 1 VIEW  COMPARISON:  04/17/2015  FINDINGS: Left PICC line has been placed with tip about 2.8 cm above the cavoatrial junction. No pneumothorax.  Stable cardiac enlargement. Increased moderately severe bilateral interstitial prominence suggesting edema.  IMPRESSION: PICC line as described with no pneumothorax.  Diffuse interstitial opacities suggesting pulmonary  edema.   Electronically Signed   By: Skipper Cliche M.D.   On: 04/20/2015 14:49   I have personally reviewed and evaluated these images and lab results as part of my medical decision-making.  I discussed the case with gastroenterology, Dr. Amedeo Plenty, patient will be endoscoped today.  Will keep nothing by mouth.  Will admit to stepdown for hospitalist service.  MDM   Final diagnoses:  Gastrointestinal hemorrhage, unspecified gastritis, unspecified gastrointestinal hemorrhage type  Hypotension, unspecified hypotension type        Leonard Schwartz, MD 04/21/15 [phone]

## 2015-04-17 NOTE — ED Notes (Signed)
Bed: RESA Expected date:  Expected time:  Means of arrival:  Comments: EMS-GI Bleed 

## 2015-04-17 NOTE — ED Notes (Signed)
Unable to get urine, pt incontinent

## 2015-04-17 NOTE — Consult Note (Signed)
[facility] Consult Note  Referring Provider: No ref. provider found Primary Care Physician:  Sonda Primes, MD Primary Gastroenterologist:  Dr.  Antony Contras Complaint: Melanotic and weakness HPI: John [name]. is an 81 y.o. white male  with a history of atrial fibrillation on Coumadin who presents with a 2-3 day history of melanoma weakness and orthostasis with near syncope. He was found to have a low blood pressure in the emergency room his blood pressures as low as 70/30. He has been resuscitated with fluid and packed red blood cells with some improvement. He denies a nausea vomiting or abdominal pain. He denies any chest pain. He denies nonstructural and inflamed or drugs. She's never had any GI bleeding needs to wear off. His INR was 2.0 and his hemoglobin was 7.2 down from 10.44 days ago. His BUN was moderately elevated.  Past Medical History  Diagnosis Date  . Anemia     low blood platelets  . Arthritis   . Blood transfusion   . Cancer     skin CA  . Cataract   . Heart murmur   . Hyperlipidemia   . Hypertension   . TIA (transient ischemic attack)   . Atrial fibrillation     on Coumadin  . Myocardial infarction     after colon surgery    Past Surgical History  Procedure Laterality Date  . Colon surgery      partial colectomy  . Total hip arthroplasty      LEFT HIP  . Coronary artery bypass graft      3 vessels  . Heart stent    . Thoracotomy    . Lens implants    . Colon surgery      right     (Not in a hospital admission)  Allergies:  Allergies  Allergen Reactions  . Polysporin [Bacitracin-Polymyxin B] Other (See Comments)    Doesn't remember reaction    Family History  Problem Relation Age of Onset  . Colon polyps Mother   . Stroke Father   . Stroke Other     Social History:  reports that he quit smoking about 49 years ago. He does not have any smokeless tobacco history on file. He reports that he drinks alcohol. His drug history is  not on file.  Review of Systems: negative except as above   Blood pressure 89/45, pulse 114, temperature 99.3 F (37.4 C), temperature source Rectal, resp. rate 30, SpO2 98 %. Head: Normocephalic, without obvious abnormality, atraumatic Neck: no adenopathy, no carotid bruit, no JVD, supple, symmetrical, trachea midline and thyroid not enlarged, symmetric, no tenderness/mass/nodules Resp: clear to auscultation bilaterally Cardio: regular rate and rhythm, S1, S2 normal, no murmur, click, rub or gallop GI: Abdomen soft nondistended with normoactive bowel sounds. nightly mass or guarding. Extremities: extremities normal, atraumatic, no cyanosis or edema  Results for orders placed or performed during the hospital encounter of 04/17/15 (from the past 48 hour(s))  Type and screen for Red Blood Exchange     Status: None   Collection Time: 04/17/15  8:53 AM  Result Value Ref Range   ABO/RH(D) A POS    Antibody Screen NEG    Sample Expiration 04/20/2015   Protime-INR     Status: Abnormal   Collection Time: 04/17/15  8:53 AM  Result Value Ref Range   Prothrombin Time 23.0 (H) 11.6 - 15.2 seconds   INR 2.05 (H) 0.00 - 1.49  Lipase, blood     Status: None  Collection Time: 04/17/15  8:53 AM  Result Value Ref Range   Lipase 30 22 - 51 U/L  Comprehensive metabolic panel     Status: Abnormal   Collection Time: 04/17/15  8:53 AM  Result Value Ref Range   Sodium 140 135 - 145 mmol/L   Potassium 5.4 (H) 3.5 - 5.1 mmol/L   Chloride 106 101 - 111 mmol/L   CO2 24 22 - 32 mmol/L   Glucose, Bld 210 (H) 65 - 99 mg/dL   BUN 58 (H) 6 - 20 mg/dL   Creatinine, Ser 1.17 0.61 - 1.24 mg/dL   Calcium 9.9 8.9 - 10.3 mg/dL   Total Protein 6.6 6.5 - 8.1 g/dL   Albumin 2.0 (L) 3.5 - 5.0 g/dL   AST 40 15 - 41 U/L   ALT 14 (L) 17 - 63 U/L   Alkaline Phosphatase 86 38 - 126 U/L   Total Bilirubin 0.4 0.3 - 1.2 mg/dL   GFR calc non Af Amer 53 (L) >60 mL/min   GFR calc Af Amer >60 >60 mL/min    Comment:  (NOTE) The eGFR has been calculated using the CKD EPI equation. This calculation has not been validated in all clinical situations. eGFR's persistently <60 mL/min signify possible Chronic Kidney Disease.    Anion gap 10 5 - 15  CBC     Status: Abnormal   Collection Time: 04/17/15  8:53 AM  Result Value Ref Range   WBC 23.2 (H) 4.0 - 10.5 K/uL   RBC 2.83 (L) 4.22 - 5.81 MIL/uL   Hemoglobin 7.4 (L) 13.0 - 17.0 g/dL   HCT 24.8 (L) 39.0 - 52.0 %   MCV 87.6 78.0 - 100.0 fL   MCH 26.1 26.0 - 34.0 pg   MCHC 29.8 (L) 30.0 - 36.0 g/dL   RDW 17.0 (H) 11.5 - 15.5 %   Platelets 301 150 - 400 K/uL  ABO/Rh     Status: None   Collection Time: 04/17/15  8:54 AM  Result Value Ref Range   ABO/RH(D) A POS   I-Stat CG4 Lactic Acid, ED  (not at [facility])     Status: Abnormal   Collection Time: 04/17/15  9:04 AM  Result Value Ref Range   Lactic Acid, Venous 4.10 (HH) 0.5 - 2.0 mmol/L   Comment NOTIFIED PHYSICIAN   Prepare RBC     Status: None   Collection Time: 04/17/15  9:25 AM  Result Value Ref Range   Order Confirmation ORDER PROCESSED BY BLOOD BANK   POC occult blood, ED RN will collect     Status: Abnormal   Collection Time: 04/17/15  9:29 AM  Result Value Ref Range   Fecal Occult Bld POSITIVE (A) NEGATIVE   Dg Chest Portable 1 View  04/17/2015   CLINICAL DATA:  GI bleeding and hypotension  EXAM: PORTABLE CHEST - 1 VIEW  COMPARISON:  03/17/2015  FINDINGS: Mild to moderate cardiac enlargement with uncoiling and calcification of the aorta. Vascular pattern normal. No consolidation or effusion.  IMPRESSION: No active disease.   Electronically Signed   By: Skipper Cliche M.D.   On: 04/17/2015 09:31    Assessment: Melenic stool strongly suggestive of upper GI bleeding in an elderly patient on Coumadin. Plan:  IV proton pump inhibitor, continue transfusion as necessary and will proceed with EGD urgently. HAYES,JOHN C 04/17/2015, 10:13 AM  Pager [phone] If no answer or after 5 PM call  [phone]

## 2015-04-18 ENCOUNTER — Encounter (HOSPITAL_COMMUNITY): Payer: Self-pay | Admitting: Gastroenterology

## 2015-04-18 DIAGNOSIS — R579 Shock, unspecified: Secondary | ICD-10-CM

## 2015-04-18 DIAGNOSIS — L8990 Pressure ulcer of unspecified site, unspecified stage: Secondary | ICD-10-CM

## 2015-04-18 DIAGNOSIS — N390 Urinary tract infection, site not specified: Secondary | ICD-10-CM | POA: Diagnosis present

## 2015-04-18 LAB — BASIC METABOLIC PANEL
ANION GAP: 8 (ref 5–15)
BUN: 45 mg/dL — ABNORMAL HIGH (ref 6–20)
CHLORIDE: 113 mmol/L — AB (ref 101–111)
CO2: 22 mmol/L (ref 22–32)
CREATININE: 0.82 mg/dL (ref 0.61–1.24)
Calcium: 9.2 mg/dL (ref 8.9–10.3)
GFR calc non Af Amer: >60 mL/min (ref >60)
Glucose, Bld: 130 mg/dL — ABNORMAL HIGH (ref 65–99)
POTASSIUM: 3.9 mmol/L (ref 3.5–5.1)
SODIUM: 143 mmol/L (ref 135–145)

## 2015-04-18 LAB — CBC
HEMATOCRIT: 27 % — AB (ref 39.0–52.0)
HEMOGLOBIN: 8.4 g/dL — AB (ref 13.0–17.0)
MCH: 26.5 pg (ref 26.0–34.0)
MCHC: 31.1 g/dL (ref 30.0–36.0)
MCV: 85.2 fL (ref 78.0–100.0)
Platelets: 228 10*3/uL (ref 150–400)
RBC: 3.17 MIL/uL — AB (ref 4.22–5.81)
RDW: 17 % — ABNORMAL HIGH (ref 11.5–15.5)
WBC: 18.8 10*3/uL — AB (ref 4.0–10.5)

## 2015-04-18 LAB — PROTIME-INR
INR: 2.39 — AB (ref 0.00–1.49)
Prothrombin Time: 25.8 seconds — ABNORMAL HIGH (ref 11.6–15.2)

## 2015-04-18 LAB — CLOTEST (H. PYLORI), BIOPSY: HELICOBACTER SCREEN: NEGATIVE

## 2015-04-18 LAB — GLUCOSE, CAPILLARY: GLUCOSE-CAPILLARY: 117 mg/dL — AB (ref 65–99)

## 2015-04-18 LAB — APTT: aPTT: 54 seconds — ABNORMAL HIGH (ref 24–37)

## 2015-04-18 MED ORDER — VANCOMYCIN HCL 10 G IV SOLR
1250.0000 mg | Freq: Two times a day (BID) | INTRAVENOUS | Status: DC
  Filled 2015-04-18: qty 1250

## 2015-04-18 MED ORDER — CETYLPYRIDINIUM CHLORIDE 0.05 % MT LIQD
7.0000 mL | Freq: Two times a day (BID) | OROMUCOSAL | Status: DC
  Administered 2015-04-18 – 2015-04-22 (×7): 7 mL via OROMUCOSAL

## 2015-04-18 MED ORDER — ENSURE ENLIVE PO LIQD
237.0000 mL | Freq: Two times a day (BID) | ORAL | Status: DC
  Administered 2015-04-18 – 2015-04-22 (×8): 237 mL via ORAL

## 2015-04-18 MED ORDER — VANCOMYCIN HCL IN DEXTROSE 1-5 GM/200ML-% IV SOLN
1000.0000 mg | Freq: Two times a day (BID) | INTRAVENOUS | Status: DC
  Administered 2015-04-18: 1000 mg via INTRAVENOUS
  Filled 2015-04-18: qty 200

## 2015-04-18 MED ORDER — CHLORHEXIDINE GLUCONATE 0.12 % MT SOLN
15.0000 mL | Freq: Two times a day (BID) | OROMUCOSAL | Status: DC
  Administered 2015-04-18 – 2015-04-22 (×9): 15 mL via OROMUCOSAL
  Filled 2015-04-18 (×9): qty 15

## 2015-04-18 NOTE — Progress Notes (Signed)
ANTIBIOTIC CONSULT NOTE - FOLLOW UP  Pharmacy Consult for Vancomycin, Zosyn Indication: rule out sepsis  Allergies  Allergen Reactions  . Polysporin [Bacitracin-Polymyxin B] Other (See Comments)    Doesn't remember reaction    Patient Measurements: Height: 5\' 11"  (180.3 cm) Weight: 165 lb 2 oz (74.9 kg) IBW/kg (Calculated) : 75.3  Vital Signs: Temp: 97.6 F (36.4 C) (09/19 1200) Temp Source: Oral (09/19 1200) BP: 104/60 mmHg (09/19 1614) Pulse Rate: 102 (09/19 0600) Intake/Output from previous day: 09/18 0701 - 09/19 0700 In: 2673.3 [I.V.:1933.3; Blood:690; IV Piggyback:50] Out: 732 [Urine:402] Intake/Output from this shift: Total I/O In: 1570 [P.O.:620; I.V.:600; IV Piggyback:350] Out: 100 [Urine:100]  Labs:  Recent Labs  04/17/15 0853 04/17/15 2208 04/18/15 0403  WBC 23.2* 18.7* 18.8*  HGB 7.4* 8.4* 8.4*  PLT 301 247 228  CREATININE 1.17 0.81 0.82   Estimated Creatinine Clearance: 64.7 mL/min (by C-G formula based on Cr of 0.82). No results for input(s): VANCOTROUGH, VANCOPEAK, VANCORANDOM, GENTTROUGH, GENTPEAK, GENTRANDOM, TOBRATROUGH, TOBRAPEAK, TOBRARND, AMIKACINPEAK, AMIKACINTROU, AMIKACIN in the last 72 hours.   Microbiology: Recent Results (from the past 720 hour(s))  Culture, blood (x 2)     Status: None (Preliminary result)   Collection Time: 04/17/15 10:39 AM  Result Value Ref Range Status   Specimen Description BLOOD LEFT ANTECUBITAL  Final   Special Requests BOTTLES DRAWN AEROBIC AND ANAEROBIC 5 CCEACH  Final   Culture   Final    NO GROWTH < 24 HOURS Performed at [facility]    Report Status PENDING  Incomplete  Culture, blood (x 2)     Status: None (Preliminary result)   Collection Time: 04/17/15 10:39 AM  Result Value Ref Range Status   Specimen Description BLOOD LEFT HAND  Final   Special Requests IN PEDIATRIC BOTTLE 1 CC  Final   Culture   Final    NO GROWTH < 24 HOURS Performed at [facility]    Report Status PENDING   Incomplete  MRSA PCR Screening     Status: None   Collection Time: 04/17/15  5:00 PM  Result Value Ref Range Status   MRSA by PCR NEGATIVE NEGATIVE Final    Comment:        The GeneXpert MRSA Assay (FDA approved for NASAL specimens only), is one component of a comprehensive MRSA colonization surveillance program. It is not intended to diagnose MRSA infection nor to guide or monitor treatment for MRSA infections.     Anti-infectives    Start     Dose/Rate Route Frequency Ordered Stop   04/18/15 1000  vancomycin (VANCOCIN) 1,250 mg in sodium chloride 0.9 % 250 mL IVPB     1,250 mg 166.7 mL/hr over 90 Minutes Intravenous Every 24 hours 04/17/15 1623     04/17/15 2200  piperacillin-tazobactam (ZOSYN) IVPB 3.375 g     3.375 g 12.5 mL/hr over 240 Minutes Intravenous Every 8 hours 04/17/15 1623     04/17/15 1130  vancomycin (VANCOCIN) 1,250 mg in sodium chloride 0.9 % 250 mL IVPB     1,250 mg 166.7 mL/hr over 90 Minutes Intravenous  Once 04/17/15 1042 04/17/15 1440   04/17/15 1015  piperacillin-tazobactam (ZOSYN) IVPB 3.375 g     3.375 g 100 mL/hr over 30 Minutes Intravenous  Once 04/17/15 1006 04/17/15 1320   04/17/15 1015  vancomycin (VANCOCIN) IVPB 1000 mg/200 mL premix  Status:  Discontinued     1,000 mg 200 mL/hr over 60 Minutes Intravenous  Once 04/17/15 1006 04/17/15  1042      Assessment: 81 yo M presented from NH with syncope. He is admitted with GI Bleed, hemorrhagic shock, and possible sepsis.  Pharmacy to begin vancomycin/zosyn  9/18 >> Zosyn  >> 9/18>> Vanc  >>    9/18 blood x2: ngtd 9/18 urine: IP  Today, 04/18/2015: Temp: afebrile WBC: elevated but trending down Renal: SCr returned to baseline wnl; CrCl 65 CG   Goal of Therapy:  Vancomycin trough level 15-20 mcg/ml  Eradication of infection Appropriate antibiotic dosing for indication and renal function  Plan:  Day 2 antibiotics  Continue Zosyn as ordered  Will increase vancomycin to 1 g IV q12  with improved renal function  Follow clinical course, renal function, culture results as available  Follow for de-escalation of antibiotics and LOT   Reuel Boom, PharmD, BCPS Pager: [phone] 04/18/2015, 4:20 PM

## 2015-04-18 NOTE — Care Management Note (Signed)
Case Management Note  Patient Details  Name: John [name]. MRN: [id number] Date of Birth: 08/28/1923  Subjective/Objective:              Anemia and hypotension      Action/Plan: Date:  Sept. 19, 2016 U.R. performed for needs and level of care. Will continue to follow for Case Management needs.  Velva Harman, RN, BSN, [place]   [phone]  Expected Discharge Date:                  Expected Discharge Plan:  Skilled Nursing Facility  In-House Referral:  Clinical Social Work  Discharge planning Services  CM Consult  Post Acute Care Choice:  NA Choice offered to:  NA  DME Arranged:    DME Agency:     HH Arranged:    [place] Agency:     Status of Service:  In process, will continue to follow  Medicare Important Message Given:    Date Medicare IM Given:    Medicare IM give by:    Date Additional Medicare IM Given:    Additional Medicare Important Message give by:     If discussed at [place] of Stay Meetings, dates discussed:    Additional Comments:  Leeroy Cha, RN 04/18/2015, 10:32 AM

## 2015-04-18 NOTE — Progress Notes (Signed)
[facility] Progress Note  Subjective: No signs of active bleeding at this time. Patient would like some more nourishment.  Objective: Vital signs in last 24 hours: Temp:  [97.4 F (36.3 C)-98.7 F (37.1 C)] 97.5 F (36.4 C) (09/19 0800) Pulse Rate:  [88-122] 102 (09/19 0600) Resp:  [16-40] 28 (09/19 0800) BP: (80-112)/(42-78) 94/62 mmHg (09/19 0800) SpO2:  [95 %-100 %] 98 % (09/19 0800) Weight:  [74.9 kg (165 lb 2 oz)] 74.9 kg (165 lb 2 oz) (09/18 1600) Weight change:    PE:  Heart regular rhythm  Lungs clear  Abdomen soft and nontender  Lab Results: Results for orders placed or performed during the hospital encounter of 04/17/15 (from the past 24 hour(s))  Lactic acid, plasma     Status: Abnormal   Collection Time: 04/17/15 10:39 AM  Result Value Ref Range   Lactic Acid, Venous 2.1 (HH) 0.5 - 2.0 mmol/L  Procalcitonin     Status: None   Collection Time: 04/17/15 10:39 AM  Result Value Ref Range   Procalcitonin 1.87 ng/mL  Lactic acid, plasma     Status: None   Collection Time: 04/17/15  4:30 PM  Result Value Ref Range   Lactic Acid, Venous 1.1 0.5 - 2.0 mmol/L  MRSA PCR Screening     Status: None   Collection Time: 04/17/15  5:00 PM  Result Value Ref Range   MRSA by PCR NEGATIVE NEGATIVE  Urinalysis, Routine w reflex microscopic (not at [facility])     Status: Abnormal   Collection Time: 04/17/15  6:00 PM  Result Value Ref Range   Color, Urine YELLOW YELLOW   APPearance CLOUDY (A) CLEAR   Specific Gravity, Urine 1.023 1.005 - 1.030   pH 5.0 5.0 - 8.0   Glucose, UA NEGATIVE NEGATIVE mg/dL   Hgb urine dipstick MODERATE (A) NEGATIVE   Bilirubin Urine NEGATIVE NEGATIVE   Ketones, ur NEGATIVE NEGATIVE mg/dL   Protein, ur NEGATIVE NEGATIVE mg/dL   Urobilinogen, UA 1.0 0.0 - 1.0 mg/dL   Nitrite NEGATIVE NEGATIVE   Leukocytes, UA SMALL (A) NEGATIVE  Urine microscopic-add on     Status: Abnormal   Collection Time: 04/17/15  6:00 PM  Result Value Ref Range    WBC, UA 3-6 <3 WBC/hpf   Bacteria, UA RARE RARE   Crystals URIC ACID CRYSTALS (A) NEGATIVE  Magnesium     Status: None   Collection Time: 04/17/15 10:08 PM  Result Value Ref Range   Magnesium 1.7 1.7 - 2.4 mg/dL  Phosphorus     Status: None   Collection Time: 04/17/15 10:08 PM  Result Value Ref Range   Phosphorus 3.0 2.5 - 4.6 mg/dL  Comprehensive metabolic panel     Status: Abnormal   Collection Time: 04/17/15 10:08 PM  Result Value Ref Range   Sodium 143 135 - 145 mmol/L   Potassium 4.1 3.5 - 5.1 mmol/L   Chloride 113 (H) 101 - 111 mmol/L   CO2 24 22 - 32 mmol/L   Glucose, Bld 130 (H) 65 - 99 mg/dL   BUN 50 (H) 6 - 20 mg/dL   Creatinine, Ser 0.81 0.61 - 1.24 mg/dL   Calcium 9.2 8.9 - 10.3 mg/dL   Total Protein 5.8 (L) 6.5 - 8.1 g/dL   Albumin 1.9 (L) 3.5 - 5.0 g/dL   AST 30 15 - 41 U/L   ALT 11 (L) 17 - 63 U/L   Alkaline Phosphatase 71 38 - 126 U/L   Total Bilirubin 1.3 (  H) 0.3 - 1.2 mg/dL   GFR calc non Af Amer >60 >60 mL/min   GFR calc Af Amer >60 >60 mL/min   Anion gap 6 5 - 15  CBC WITH DIFFERENTIAL     Status: Abnormal   Collection Time: 04/17/15 10:08 PM  Result Value Ref Range   WBC 18.7 (H) 4.0 - 10.5 K/uL   RBC 3.09 (L) 4.22 - 5.81 MIL/uL   Hemoglobin 8.4 (L) 13.0 - 17.0 g/dL   HCT 26.6 (L) 39.0 - 52.0 %   MCV 86.1 78.0 - 100.0 fL   MCH 27.2 26.0 - 34.0 pg   MCHC 31.6 30.0 - 36.0 g/dL   RDW 16.7 (H) 11.5 - 15.5 %   Platelets 247 150 - 400 K/uL   Neutrophils Relative % 86 %   Lymphocytes Relative 7 %   Monocytes Relative 6 %   Eosinophils Relative 1 %   Basophils Relative 0 %   Neutro Abs 16.1 (H) 1.7 - 7.7 K/uL   Lymphs Abs 1.3 0.7 - 4.0 K/uL   Monocytes Absolute 1.1 (H) 0.1 - 1.0 K/uL   Eosinophils Absolute 0.2 0.0 - 0.7 K/uL   Basophils Absolute 0.0 0.0 - 0.1 K/uL   RBC Morphology POLYCHROMASIA PRESENT   APTT     Status: Abnormal   Collection Time: 04/17/15 10:08 PM  Result Value Ref Range   aPTT 47 (H) 24 - 37 seconds  Protime-INR     Status:  Abnormal   Collection Time: 04/17/15 10:08 PM  Result Value Ref Range   Prothrombin Time 23.9 (H) 11.6 - 15.2 seconds   INR 2.16 (H) 0.00 - 1.49  TSH     Status: None   Collection Time: 04/17/15 10:08 PM  Result Value Ref Range   TSH 1.302 0.350 - 4.500 uIU/mL  Basic metabolic panel     Status: Abnormal   Collection Time: 04/18/15  4:03 AM  Result Value Ref Range   Sodium 143 135 - 145 mmol/L   Potassium 3.9 3.5 - 5.1 mmol/L   Chloride 113 (H) 101 - 111 mmol/L   CO2 22 22 - 32 mmol/L   Glucose, Bld 130 (H) 65 - 99 mg/dL   BUN 45 (H) 6 - 20 mg/dL   Creatinine, Ser 0.82 0.61 - 1.24 mg/dL   Calcium 9.2 8.9 - 10.3 mg/dL   GFR calc non Af Amer >60 >60 mL/min   GFR calc Af Amer >60 >60 mL/min   Anion gap 8 5 - 15  CBC     Status: Abnormal   Collection Time: 04/18/15  4:03 AM  Result Value Ref Range   WBC 18.8 (H) 4.0 - 10.5 K/uL   RBC 3.17 (L) 4.22 - 5.81 MIL/uL   Hemoglobin 8.4 (L) 13.0 - 17.0 g/dL   HCT 27.0 (L) 39.0 - 52.0 %   MCV 85.2 78.0 - 100.0 fL   MCH 26.5 26.0 - 34.0 pg   MCHC 31.1 30.0 - 36.0 g/dL   RDW 17.0 (H) 11.5 - 15.5 %   Platelets 228 150 - 400 K/uL  APTT     Status: Abnormal   Collection Time: 04/18/15  4:03 AM  Result Value Ref Range   aPTT 54 (H) 24 - 37 seconds  Protime-INR     Status: Abnormal   Collection Time: 04/18/15  4:03 AM  Result Value Ref Range   Prothrombin Time 25.8 (H) 11.6 - 15.2 seconds   INR 2.39 (H) 0.00 - 1.49  Glucose,  capillary     Status: Abnormal   Collection Time: 04/18/15  8:26 AM  Result Value Ref Range   Glucose-Capillary 117 (H) 65 - 99 mg/dL   Comment 1 Notify RN    Comment 2 Document in Chart     Studies/Results: No results found.    Assessment: Duodenal ulcer  Plan:   Continue current management. Plan to advance diet to full liquids today and see if he can tolerated.    Cassell Clement 04/18/2015, 10:15 AM  Pager: [phone] If no answer or after 5 PM call [phone] Lab Results  Component Value Date    HGB 8.4* 04/18/2015   HGB 8.4* 04/17/2015   HGB 7.4* 04/17/2015   HGB 15.6 05/09/2011   HGB 15.9 11/22/2010   HGB 14.9 06/07/2010   HCT 27.0* 04/18/2015   HCT 26.6* 04/17/2015   HCT 24.8* 04/17/2015   HCT 46.2 05/09/2011   HCT 47.7 11/22/2010   HCT 45.9 06/07/2010   ALKPHOS 71 04/17/2015   ALKPHOS 86 04/17/2015   ALKPHOS 169* 03/28/2015   AST 30 04/17/2015   AST 40 04/17/2015   AST 116* 03/28/2015   ALT 11* 04/17/2015   ALT 14* 04/17/2015   ALT 44 03/28/2015

## 2015-04-18 NOTE — Progress Notes (Signed)
Weak, confused. Poor intake; incontinent.

## 2015-04-18 NOTE — Progress Notes (Addendum)
Patient ID: John Halt., male   DOB: [birth year], 81 y.o.   MRN: [id number] [facility] PROGRESS NOTE  John Halt. PTW:[id number] DOB: 1924-03-20 DOA: 04/17/2015 PCP: Walker Kehr, MD  Brief narrative:    81 year old male with past medical history of atrial fibrillation (on anticoagulation with coumadin), hypertension, GERD who presented from [facility] SNF to [facility] ED status post syncopal event the day prior to this admission. Patient also reported dark and occasionally melanotic stool.   In ED, BP was 83/48 and he is receiving IV fluids. His HR is 114-129, RR 21-30, T max 99.3 F and oxygen saturation 98% on room air. Blood work is notable for WBC count of 23.2, hemoglobin os 7.4, normal platelets. Potassium was 5.4, INR 2.05. CXR showed no acute cardiopulmonary findings. GI will see the pt in consultation.  Anticipated discharge: Remains in SDU due to hypotension.   Assessment/Plan:    Principal Problem: Hemorrhagic shock / Acute upper GI hemorrhage / Acute blood loss anemia in the setting of anticoagulation - Dark to melanotic stool raise the concern for upper GI bleed - Pt presented with evidence of hemorrhagic shock concerning acute blood loss anemia and hypotension ont corrected with initial fluid resuscitation - He remains hypotensive with BP this am 94/62. - He has received 2 units PRBC transfusion since admission. - Hemoglobin is 8.4 post transfusion - GI recommended supportive care and advancing diet to full liquids - Continue to monitor in SDU for next 24 hours. - Coumadin on hold   Active Problems: Sepsis, unspecified organism / Leukocytosis - Sepsis criteria met on admission with tachycardia, tachypnea, hypotension, leukocytosis, elevated lactic acid. Procalcitonin was mildly elevated at 1.87. - Unclear source of infection but possible UTI. Small leukocytes seen on admission UA. Urine culture pending.  - Blood culture so far showed no growth.  -  Continue vanco and zosyn empirically till final blood culture report available  - Hemodynamically stable, does not require pressor support   Syncope - Likely in the setting of acute hemorrhagic shock and hypotension - Continue to monitor in SDU due to ongoing hypotension - Continue IV fluids    Atrial fibrillation / On anticoagulant therapy - CHADS vasc score 4 - On anticoagulation with coumadin but on hold due to GI bleed - Metoprolol on hold due to hypotension    Benign essential HTN - All BP meds on hold due to hypotension    Hyperkalemia - Secondary to sepsis - Resolved   Retroperitoneal lymphadenopathy / Lymph nodes enlarged - Based on recet CT abdomen he was found to have bulky lymphadenopathy - Underwent recent biopsy - results pending     DVT prophylaxis:  - Anticoagulation on hold due to GI bleed - Use SCD's bilaterally     Code Status: Full.  Family Communication:  plan of care discussed with the patient; family not at the bedside this am  Disposition Plan: remains in SDU due to hypotension    IV access:  Peripheral IV  Procedures and diagnostic studies:    Dg Chest Portable 1 View 04/17/2015  No active disease.   Electronically Signed   By: Skipper Cliche M.D.   On: 04/17/2015 09:31   Medical Consultants:  Gastroenterology, Anson Fret  Other Consultants:  Physical therapy Nutrition  IAnti-Infectives:   Vanco and zosyn 04/17/2015 -->     Leisa Lenz, MD  [facility] Pager [phone]  Time spent in minutes: 25 minutes  If 7PM-7AM, please contact night-coverage  [email or web address] Password [email or web address] 04/18/2015, 4:17 PM   LOS: 1 day    HPI/Subjective: No acute overnight events. Patient reports feeling better.   Objective: Filed Vitals:   04/18/15 1100 04/18/15 1200 04/18/15 1400 04/18/15 1614  BP:  103/51 104/57 104/60  Pulse:      Temp:  97.6 F (36.4 C)    TempSrc:  Oral    Resp: 25 22 37 38  Height:      Weight:      SpO2:  97% 98% 100% 100%    Intake/Output Summary (Last 24 hours) at 04/18/15 1617 Last data filed at 04/18/15 1612  Gross per 24 hour  Intake 3853.33 ml  Output    502 ml  Net 3351.33 ml    Exam:   General:  Pt is alert, follows commands appropriately, not in acute distress  Cardiovascular: slight tachycardia, S1/S2 (+)  Respiratory: Clear to auscultation bilaterally, no wheezing, no crackles, no rhonchi  Abdomen: Soft, non tender, non distended, bowel sounds present  Extremities: No edema, pulses DP and PT palpable bilaterally  Neuro: Grossly nonfocal  Data Reviewed: Basic Metabolic Panel:  Recent Labs Lab 04/17/15 0853 04/17/15 2208 04/18/15 0403  NA 140 143 143  K 5.4* 4.1 3.9  CL 106 113* 113*  CO2 $Re'24 24 22  'TUf$ GLUCOSE 210* 130* 130*  BUN 58* 50* 45*  CREATININE 1.17 0.81 0.82  CALCIUM 9.9 9.2 9.2  MG  --  1.7  --   PHOS  --  3.0  --    Liver Function Tests:  Recent Labs Lab 04/17/15 0853 04/17/15 2208  AST 40 30  ALT 14* 11*  ALKPHOS 86 71  BILITOT 0.4 1.3*  PROT 6.6 5.8*  ALBUMIN 2.0* 1.9*    Recent Labs Lab 04/17/15 0853  LIPASE 30   No results for input(s): AMMONIA in the last 168 hours. CBC:  Recent Labs Lab 04/13/15 0852 04/17/15 0853 04/17/15 2208 04/18/15 0403  WBC 15.9* 23.2* 18.7* 18.8*  NEUTROABS  --   --  16.1*  --   HGB 10.3* 7.4* 8.4* 8.4*  HCT 34.8* 24.8* 26.6* 27.0*  MCV 87.2 87.6 86.1 85.2  PLT 283 301 247 228   Cardiac Enzymes: No results for input(s): CKTOTAL, CKMB, CKMBINDEX, TROPONINI in the last 168 hours. BNP: Invalid input(s): POCBNP CBG:  Recent Labs Lab 04/18/15 0826  GLUCAP 117*    Recent Results (from the past 240 hour(s))  Culture, blood (x 2)     Status: None (Preliminary result)   Collection Time: 04/17/15 10:39 AM  Result Value Ref Range Status   Specimen Description BLOOD LEFT ANTECUBITAL  Final   Special Requests BOTTLES DRAWN AEROBIC AND ANAEROBIC 5 CCEACH  Final   Culture   Final    NO  GROWTH < 24 HOURS Performed at [facility]    Report Status PENDING  Incomplete  Culture, blood (x 2)     Status: None (Preliminary result)   Collection Time: 04/17/15 10:39 AM  Result Value Ref Range Status   Specimen Description BLOOD LEFT HAND  Final   Special Requests IN PEDIATRIC BOTTLE 1 CC  Final   Culture   Final    NO GROWTH < 24 HOURS Performed at [facility]    Report Status PENDING  Incomplete  MRSA PCR Screening     Status: None   Collection Time: 04/17/15  5:00 PM  Result Value Ref Range Status   MRSA by PCR NEGATIVE NEGATIVE Final  Comment:        The GeneXpert MRSA Assay (FDA approved for NASAL specimens only), is one component of a comprehensive MRSA colonization surveillance program. It is not intended to diagnose MRSA infection nor to guide or monitor treatment for MRSA infections.      Scheduled Meds: . antiseptic oral rinse  7 mL Mouth Rinse q12n4p  . chlorhexidine  15 mL Mouth Rinse BID  . feeding supplement (ENSURE ENLIVE)  237 mL Oral BID BM  . pantoprazole (PROTONIX) IV  40 mg Intravenous Q12H  . piperacillin-tazobactam (ZOSYN)  IV  3.375 g Intravenous Q8H  . sodium chloride  3 mL Intravenous Q12H  . vancomycin  1,250 mg Intravenous Q24H   Continuous Infusions: . sodium chloride 75 mL/hr at 04/18/15 1612

## 2015-04-19 ENCOUNTER — Telehealth: Payer: Self-pay | Admitting: *Deleted

## 2015-04-19 DIAGNOSIS — L8991 Pressure ulcer of unspecified site, stage 1: Secondary | ICD-10-CM

## 2015-04-19 DIAGNOSIS — E440 Moderate protein-calorie malnutrition: Secondary | ICD-10-CM | POA: Insufficient documentation

## 2015-04-19 LAB — BASIC METABOLIC PANEL
ANION GAP: 8 (ref 5–15)
BUN: 29 mg/dL — AB (ref 6–20)
CHLORIDE: 113 mmol/L — AB (ref 101–111)
CO2: 23 mmol/L (ref 22–32)
Calcium: 9.1 mg/dL (ref 8.9–10.3)
Creatinine, Ser: 0.79 mg/dL (ref 0.61–1.24)
GFR calc Af Amer: >60 mL/min (ref >60)
GLUCOSE: 133 mg/dL — AB (ref 65–99)
POTASSIUM: 3.7 mmol/L (ref 3.5–5.1)
Sodium: 144 mmol/L (ref 135–145)

## 2015-04-19 LAB — URINE CULTURE: CULTURE: NO GROWTH

## 2015-04-19 LAB — CBC
HCT: 24.4 % — ABNORMAL LOW (ref 39.0–52.0)
HEMOGLOBIN: 7.4 g/dL — AB (ref 13.0–17.0)
MCH: 26.3 pg (ref 26.0–34.0)
MCHC: 30.3 g/dL (ref 30.0–36.0)
MCV: 86.8 fL (ref 78.0–100.0)
Platelets: 233 10*3/uL (ref 150–400)
RBC: 2.81 MIL/uL — AB (ref 4.22–5.81)
RDW: 17.1 % — ABNORMAL HIGH (ref 11.5–15.5)
WBC: 15.2 10*3/uL — AB (ref 4.0–10.5)

## 2015-04-19 LAB — GLUCOSE, CAPILLARY: Glucose-Capillary: 135 mg/dL — ABNORMAL HIGH (ref 65–99)

## 2015-04-19 LAB — PREPARE RBC (CROSSMATCH)

## 2015-04-19 MED ORDER — SODIUM CHLORIDE 0.9 % IV SOLN
Freq: Once | INTRAVENOUS | Status: AC
  Administered 2015-04-19: 12:00:00 via INTRAVENOUS

## 2015-04-19 MED ORDER — DEXTROSE 5 % IV SOLN
1.0000 g | Freq: Every day | INTRAVENOUS | Status: DC
  Administered 2015-04-19 – 2015-04-22 (×3): 1 g via INTRAVENOUS
  Filled 2015-04-19 (×4): qty 10

## 2015-04-19 NOTE — Progress Notes (Addendum)
Patient ID: Lequita Halt., male   DOB: [birth year], 81 y.o.   MRN: [id number] [facility] PROGRESS NOTE  Lequita Halt. [id number]:[id number] DOB: October 19, 1923 DOA: 04/17/2015 PCP: Walker Kehr, MD  Brief narrative:    81 year old male with past medical history of atrial fibrillation (on anticoagulation with coumadin), hypertension, GERD who presented from [facility] SNF to [facility] ED status post syncopal event the day prior to this admission. Patient also reported dark and melanotic stool.  In ED, BP was 83/48 and he is receiving IV fluids. His HR is 114-129, RR 21-30, T max 99.3 F and oxygen saturation 98% on room air. Blood work is notable for WBC count of 23.2, hemoglobin os 7.4, normal platelets. Potassium was 5.4, INR 2.05. CXR showed no acute cardiopulmonary findings. GI will see the pt in consultation.   Anticipated discharge: Will give 1 unit PRBC today.  Assessment/Plan:    Principal Problem: Hemorrhagic shock / Acute upper GI hemorrhage / Acute blood loss anemia in the setting of anticoagulation - Pt presented with concern for upper GI bleed; likley triggered by coumadin pt is taking for a fib - He was hypotensive on admission with and has required 2 units of PRBC on admission - His current hemoglobin is 7.4 so will place order for another unit of PRBC transfusion today  - Pt was seen by GI in consultation and recommendation was for supportive care - Will continue IV fluids for BP support - We will continue to hold coumadin until hemoglobin stabilizes and bleeding issues resolve.   Active Problems: Sepsis, unspecified organism / Leukocytosis - Sepsis criteria met on admission with tachycardia, tachypnea, hypotension, leukocytosis, elevated lactic acid. Procalcitonin was mildly elevated at 1.87. Source of infection presumed to be UTI since small leukocytes seen on UA on admission - Follow up urine culture results - Pt initially on vanco and zosyn. Stopped those abx today and  switched to Rocephin.  - Blood culture so far showed no growth.  - Continue vanco and zosyn empirically till final blood culture report available   Syncope - Likely in the setting of acute hemorrhagic shock and hypotension - Will get PT evaluation    Atrial fibrillation / On anticoagulant therapy - CHADS vasc score 4 - Coumadin but on hold due to GI bleed - Continue to hold metoprolol until hypotension resolves.   Benign essential HTN - Now hypotensive so BP meds on hold.    Hyperkalemia - Secondary to sepsis - Resolved   Retroperitoneal lymphadenopathy / Lymph nodes enlarged - Based on recet CT abdomen he was found to have bulky lymphadenopathy - Underwent recent biopsy - results pending as of 9/20.  Pressure ulcer, stage 1 - Intact skin with non blanchable redness over bony prominence  - WOC consulted    DVT prophylaxis:  - Anticoagulation on hold due to GI bleed - Using SCD's bilaterally     Code Status: Full.  Family Communication:  plan of care discussed with the patient; family not at the bedside this am  Disposition Plan: remains in SDU because of hypotension    IV access:  Peripheral IV  Procedures and diagnostic studies:    Dg Chest Portable 1 View 04/17/2015  No active disease.   Electronically Signed   By: Skipper Cliche M.D.   On: 04/17/2015 09:31   Medical Consultants:  Gastroenterology, Anson Fret  Other Consultants:  Physical therapy Nutrition  IAnti-Infectives:   Vanco and zosyn from admission to 9/20 Rocephin  from 04/19/2015 -->    Leisa Lenz, MD  [facility] Pager [phone]  Time spent in minutes: 25 minutes  If 7PM-7AM, please contact night-coverage [email or web address] Password [email or web address] 04/19/2015, 6:20 AM   LOS: 2 days    HPI/Subjective: No acute overnight events. No respiratory distress.   Objective: Filed Vitals:   04/18/15 2200 04/19/15 0000 04/19/15 0400 04/19/15 0500  BP:  106/59 102/60   Pulse:      Temp:   98 F (36.7 C) 97.9 F (36.6 C)   TempSrc:      Resp: 31 22 32   Height:      Weight:    76.9 kg (169 lb 8.5 oz)  SpO2: 100% 100% 100%     Intake/Output Summary (Last 24 hours) at 04/19/15 0620 Last data filed at 04/19/15 0400  Gross per 24 hour  Intake   3100 ml  Output    100 ml  Net   3000 ml    Exam:   General:  Pt is sleeping, no distress  Cardiovascular: rate controlled, appreciate S1, S2  Respiratory: bilateral air entry, no wheezing   Abdomen: non tender, non distended, (+) BS  Extremities: No leg swelling, palpable pulses   Neuro: Nonfocal  Data Reviewed: Basic Metabolic Panel:  Recent Labs Lab 04/17/15 0853 04/17/15 2208 04/18/15 0403 04/19/15 0350  NA 140 143 143 144  K 5.4* 4.1 3.9 3.7  CL 106 113* 113* 113*  CO2 _0 GLUCOSE 210* 130* 130* 133*  BUN 58* 50* 45* 29*  CREATININE 1.17 0.81 0.82 0.79  CALCIUM 9.9 9.2 9.2 9.1  MG  --  1.7  --   --   PHOS  --  3.0  --   --    Liver Function Tests:  Recent Labs Lab 04/17/15 0853 04/17/15 2208  AST 40 30  ALT 14* 11*  ALKPHOS 86 71  BILITOT 0.4 1.3*  PROT 6.6 5.8*  ALBUMIN 2.0* 1.9*    Recent Labs Lab 04/17/15 0853  LIPASE 30   No results for input(s): AMMONIA in the last 168 hours. CBC:  Recent Labs Lab 04/13/15 0852 04/17/15 0853 04/17/15 2208 04/18/15 0403 04/19/15 0350  WBC 15.9* 23.2* 18.7* 18.8* 15.2*  NEUTROABS  --   --  16.1*  --   --   HGB 10.3* 7.4* 8.4* 8.4* 7.4*  HCT 34.8* 24.8* 26.6* 27.0* 24.4*  MCV 87.2 87.6 86.1 85.2 86.8  PLT 283 301 247 228 233   Cardiac Enzymes: No results for input(s): CKTOTAL, CKMB, CKMBINDEX, TROPONINI in the last 168 hours. BNP: Invalid input(s): POCBNP CBG:  Recent Labs Lab 04/18/15 0826  GLUCAP 117*    Recent Results (from the past 240 hour(s))  Culture, blood (x 2)     Status: None (Preliminary result)   Collection Time: 04/17/15 10:39 AM  Result Value Ref Range Status   Specimen Description BLOOD LEFT  ANTECUBITAL  Final   Special Requests BOTTLES DRAWN AEROBIC AND ANAEROBIC 5 CCEACH  Final   Culture   Final    NO GROWTH < 24 HOURS Performed at [facility]    Report Status PENDING  Incomplete  Culture, blood (x 2)     Status: None (Preliminary result)   Collection Time: 04/17/15 10:39 AM  Result Value Ref Range Status   Specimen Description BLOOD LEFT HAND  Final   Special Requests IN PEDIATRIC BOTTLE 1 CC  Final   Culture   Final    NO  GROWTH < 24 HOURS Performed at [facility]    Report Status PENDING  Incomplete  MRSA PCR Screening     Status: None   Collection Time: 04/17/15  5:00 PM  Result Value Ref Range Status   MRSA by PCR NEGATIVE NEGATIVE Final    Comment:        The GeneXpert MRSA Assay (FDA approved for NASAL specimens only), is one component of a comprehensive MRSA colonization surveillance program. It is not intended to diagnose MRSA infection nor to guide or monitor treatment for MRSA infections.      Scheduled Meds: . antiseptic oral rinse  7 mL Mouth Rinse q12n4p  . chlorhexidine  15 mL Mouth Rinse BID  . feeding supplement (ENSURE ENLIVE)  237 mL Oral BID BM  . pantoprazole (PROTONIX) IV  40 mg Intravenous Q12H  . sodium chloride  3 mL Intravenous Q12H   Continuous Infusions: . sodium chloride 75 mL/hr at 04/18/15 1612

## 2015-04-19 NOTE — Evaluation (Addendum)
Physical Therapy Evaluation Patient Details Name: John [name]. MRN: [id number] DOB: September 19, 1923 Today's Date: 04/19/2015   History of Present Illness  81 year old male with past medical history of atrial fibrillation , hypertension, GERD who presented from [facility] center SNF to [facility] ED 04/17/15  status post syncopal event the day prior to this admission. Patient also reported dark and melanotic stool. BP 83/48, hgb 7.4.  Clinical Impression  Patient  Actually mobilized better than anticipated. Patient will Benefit from PT to address problems listed in note below.    Follow Up Recommendations Supervision/Assistance - 24 hour/SNF    Equipment Recommendations  None recommended by PT    Recommendations for Other Services       Precautions / Restrictions Precautions Precautions: Fall Precaution Comments: monitor VS, incontinence, has Depends      Mobility  Bed Mobility Overal bed mobility: Needs Assistance;+2 for physical assistance Bed Mobility: Supine to Sit;Rolling Rolling: Min assist   Supine to sit: Mod assist     General bed mobility comments: cues for use of rail, lifting assist to get trunk upright.  Transfers Overall transfer level: Needs assistance   Transfers: Sit to/from Stand;Stand Pivot Transfers Sit to Stand: Mod assist;+2 safety/equipment Stand pivot transfers: +2 safety/equipment;Mod assist       General transfer comment: steady assist to rise from the bed, cues to reach to recliner  Ambulation/Gait Ambulation/Gait assistance: Mod assist;+2 safety/equipment Ambulation Distance (Feet): 5 Feet   Gait Pattern/deviations: Step-to pattern     General Gait Details: used back of chair for support to take several small steps to recliner.  Stairs            Wheelchair Mobility    Modified Rankin (Stroke Patients Only)       Balance Overall balance assessment: Needs assistance Sitting-balance support: Bilateral upper extremity supported;Feet  supported Sitting balance-Leahy Scale: Fair     Standing balance support: During functional activity Standing balance-Leahy Scale: Poor                               Pertinent Vitals/Pain Pain Assessment: No/denies pain    Home Living Family/patient expects to be discharged to:: Skilled nursing facility                 Additional Comments: was at snf after DC from hospital recently    Prior Function Level of Independence: Needs assistance               Hand Dominance        Extremity/Trunk Assessment   Upper Extremity Assessment: Generalized weakness           Lower Extremity Assessment: Generalized weakness      Cervical / Trunk Assessment: Kyphotic  Communication   Communication: HOH  Cognition Arousal/Alertness: Awake/alert Behavior During Therapy: WFL for tasks assessed/performed Overall Cognitive Status: History of cognitive impairments - at baseline                      General Comments      Exercises        Assessment/Plan    PT Assessment Patient needs continued PT services  PT Diagnosis Difficulty walking;Generalized weakness   PT Problem List Decreased strength;Decreased range of motion;Decreased activity tolerance;Decreased balance;Decreased mobility;Decreased cognition;Decreased knowledge of precautions;Decreased safety awareness;Decreased knowledge of use of DME;Cardiopulmonary status limiting activity  PT Treatment Interventions DME instruction;Gait training;Functional mobility training;Therapeutic activities;Therapeutic exercise;Patient/family education  PT Goals (Current goals can be found in the Care Plan section) Acute Rehab PT Goals Patient Stated Goal: agreed to OOB. PT Goal Formulation: With patient/family Time For Goal Achievement: 05/03/15 Potential to Achieve Goals: Good    Frequency Min 3X/week   Barriers to discharge        Co-evaluation               End of Session   Activity  Tolerance: Patient tolerated treatment well Patient left: in chair;with call bell/phone within reach Nurse Communication: Mobility status         Time: 1451-1515 PT Time Calculation (min) (ACUTE ONLY): 24 min   Charges:   PT Evaluation $Initial PT Evaluation Tier I: 1 Procedure PT Treatments $Therapeutic Activity: 8-22 mins   PT G Codes:        Claretha Cooper 04/19/2015, 5:46 PM Tresa Endo PT [phone]

## 2015-04-19 NOTE — Progress Notes (Signed)
[facility] Progress Note  Subjective: No sign of active bleeding at this time Objective: Vital signs in last 24 hours: Temp:  [97.6 F (36.4 C)-98 F (36.7 C)] 97.8 F (36.6 C) (09/20 0900) Resp:  [22-38] 32 (09/20 0400) BP: (102-106)/(51-60) 102/60 mmHg (09/20 0400) SpO2:  [98 %-100 %] 100 % (09/20 0400) Weight:  [76.9 kg (169 lb 8.5 oz)] 76.9 kg (169 lb 8.5 oz) (09/20 0500) Weight change: 2 kg (4 lb 6.6 oz)   PE: No distress Heart RRR Lungs clear Abdomen soft non tender     Lab Results: Results for orders placed or performed during the hospital encounter of 04/17/15 (from the past 24 hour(s))  CBC     Status: Abnormal   Collection Time: 04/19/15  3:50 AM  Result Value Ref Range   WBC 15.2 (H) 4.0 - 10.5 K/uL   RBC 2.81 (L) 4.22 - 5.81 MIL/uL   Hemoglobin 7.4 (L) 13.0 - 17.0 g/dL   HCT 24.4 (L) 39.0 - 52.0 %   MCV 86.8 78.0 - 100.0 fL   MCH 26.3 26.0 - 34.0 pg   MCHC 30.3 30.0 - 36.0 g/dL   RDW 17.1 (H) 11.5 - 15.5 %   Platelets 233 150 - 400 K/uL  Basic metabolic panel     Status: Abnormal   Collection Time: 04/19/15  3:50 AM  Result Value Ref Range   Sodium 144 135 - 145 mmol/L   Potassium 3.7 3.5 - 5.1 mmol/L   Chloride 113 (H) 101 - 111 mmol/L   CO2 23 22 - 32 mmol/L   Glucose, Bld 133 (H) 65 - 99 mg/dL   BUN 29 (H) 6 - 20 mg/dL   Creatinine, Ser 0.79 0.61 - 1.24 mg/dL   Calcium 9.1 8.9 - 10.3 mg/dL   GFR calc non Af Amer >60 >60 mL/min   GFR calc Af Amer >60 >60 mL/min   Anion gap 8 5 - 15  Prepare RBC     Status: None   Collection Time: 04/19/15  7:00 AM  Result Value Ref Range   Order Confirmation ORDER PROCESSED BY BLOOD BANK   Glucose, capillary     Status: Abnormal   Collection Time: 04/19/15  8:37 AM  Result Value Ref Range   Glucose-Capillary 135 (H) 65 - 99 mg/dL   Comment 1 Notify RN    Comment 2 Document in Chart     Studies/Results: No results found.    Assessment: Duodenal ulcer/ S/P bleed Clo test  negative  Plan:   Continue PPI    John [name] 04/19/2015, 11:17 AM  Pager: [phone] If no answer or after 5 PM call [phone] Lab Results  Component Value Date   HGB 7.4* 04/19/2015   HGB 8.4* 04/18/2015   HGB 8.4* 04/17/2015   HGB 15.6 05/09/2011   HGB 15.9 11/22/2010   HGB 14.9 06/07/2010   HCT 24.4* 04/19/2015   HCT 27.0* 04/18/2015   HCT 26.6* 04/17/2015   HCT 46.2 05/09/2011   HCT 47.7 11/22/2010   HCT 45.9 06/07/2010   ALKPHOS 71 04/17/2015   ALKPHOS 86 04/17/2015   ALKPHOS 169* 03/28/2015   AST 30 04/17/2015   AST 40 04/17/2015   AST 116* 03/28/2015   ALT 11* 04/17/2015   ALT 14* 04/17/2015   ALT 44 03/28/2015

## 2015-04-19 NOTE — Consult Note (Signed)
WOC wound consult note Reason for Consult:Area of erythema on buttocks (at apex of gluteal cleft)  Wound type:Moisture associated skin damage (not a pressure injury) Pressure Ulcer POA: Yes/No Measurement:8cm x 0.2cm x 0.2cm Wound bed:  Pink, moist Drainage (amount, consistency, odor) scant serous Periwound:intact dry Dressing procedure/placement/frequency: Patient is incontinent of urine and stool.  Recommend turning side to side and avoiding the supine position while in bed.  Use of house cleanser and moisture barrier product will protect and repair tissue.  Prevalon boots are provided to redistribution pressure to heels.  Noted is an ecchymotic area on the left great toe measuring 1cm round with no depth.  The boots will protect this area as well as it is not pressure, but traumatic vs arterial insufficiency. [place] nursing team will not follow, but will remain available to this patient, the nursing and medical team.  Please re-consult if needed. Thanks, Maudie Flakes, MSN, RN, [place], [place], [place] [phone])

## 2015-04-19 NOTE — Progress Notes (Signed)
PT Cancellation Note  Patient Details Name: John [name]. MRN: [id number] DOB: 1923/11/23   Cancelled Treatment:    Reason Eval/Treat Not Completed: Patient not medically ready (hgb remains  low today. will  wait and see in PM after receives  1 unit of blood. )   Claretha Cooper 04/19/2015, 7:59 AM Tresa Endo PT [phone]

## 2015-04-19 NOTE — Telephone Encounter (Signed)
VM message received from pt's daughter, Sadie Haber requesting results of last week's biopsy of lymph node. Pt is currently in ICU @ [facility] d/t GI bleed.

## 2015-04-19 NOTE — Clinical Social Work Note (Signed)
Clinical Social Work Assessment  Patient Details  Name: John [name]. MRN: [id number] Date of Birth: 1924-01-06  Date of referral:  04/19/15               Reason for consult:  Facility Placement, Discharge Planning                Permission sought to share information with:    Permission granted to share information::     Name::        Agency::     Relationship::     Contact Information:     Housing/Transportation Living arrangements for the past 2 months:  [place], [place] of Information:  Patient, Adult Children Patient Interpreter Needed:  None Criminal Activity/Legal Involvement Pertinent to Current Situation/Hospitalization:  No - Comment as needed Significant Relationships:  Adult Children Lives with:  Facility Resident Do you feel safe going back to the place where you live?  Yes Need for family participation in patient care:  Yes (Comment)  Care giving concerns:  Pt will require continued SNF placement at d/c. Pt / family are considering new placement.   Social Worker assessment / plan:  Pt hospitalized on 04/17/15 from [facility] with GI bleed. Pt was placement from [facility] ED to [facility] last month ( pvt pay ). CSW met with pt / daughter , John [name], on 9/19 to assist with d/c planning. Pt / family will decide if pt will return to [facility] at d/c or accept new SNF placement. CSW will meet again today with pt / daughter to continue assisting with d/c planning.  Employment status:  Retired Forensic scientist:  Medicare PT Recommendations:  Not assessed at this time Information / Referral to community resources:  [place]  Patient/Family's Response to care:  Daughter reports that her dad wasn't very happy at [facility]. Family is working with SNF to address concerns.  Patient/Family's Understanding of and Emotional Response to Diagnosis, Current Treatment, and Prognosis:  Pt / Daughter are aware of pt's  medical status. They are waiting to receive biopsy results that will hopefully be available today. Pt / daughter report that the last few weeks have been frustrating. Support provided.  Emotional Assessment Appearance:  Appears stated age Attitude/Demeanor/Rapport:  Other (cooperative) Affect (typically observed):  Calm, Pleasant Orientation:  Oriented to Self, Oriented to Place, Oriented to  Time, Oriented to Situation Alcohol / Substance use:  Not Applicable Psych involvement (Current and /or in the community):  No (Comment)  Discharge Needs  Concerns to be addressed:  Discharge Planning Concerns Readmission within the last 30 days:  No Current discharge risk:  None Barriers to Discharge:  No Barriers Identified   Luretha Rued, [place] 04/19/2015, 9:46 AM

## 2015-04-19 NOTE — Progress Notes (Signed)
Initial Nutrition Assessment  DOCUMENTATION CODES:   Non-severe (moderate) malnutrition in context of chronic illness  INTERVENTION:  - Continue Ensure Enlive po BID, each supplement provides 350 kcal and 20 grams of protein - Encourage PO intakes at meals and with supplements - RD will continue to monitor for needs  NUTRITION DIAGNOSIS:   Inadequate oral intake related to acute illness as evidenced by per patient/family report.  GOAL:   Patient will meet greater than or equal to 90% of their needs  MONITOR:   PO intake, Supplement acceptance, Diet advancement, Weight trends, Skin, I & O's  REASON FOR ASSESSMENT:   Malnutrition Screening Tool  ASSESSMENT:   81 year old male with past medical history of atrial fibrillation (on anticoagulation with coumadin), hypertension, GERD who presented from [facility] center SNF to [facility] ED status post syncopal event the day prior to this admission. Patient has had reports of dark and melanotic stool for past few days prior to this admission. He has had recent biopsy for what was thought to be lymphoma. No reports of abdominal pain, no nausea or vomiting. No chest pain, shortness of breath or palpitations. No blood in urine. No fever, chills, cough. No reports of weakness at this time but he did say he felt week for some time now.   Pt seen for MST. BMI indicates normal weight status. Pt's daughter provides most of information as pt has intermittent confusion. He consumed 100% milk and orange juice for breakfast, 1-2 bites of vanilla ice cream, and daughter was going to attempt to give him a few bites of grits with butter when RD was leaving room.  PTA, pt began to have decreased appetite with associated weight loss and weakness 8 weeks ago. His UBW is 208 lbs, per daughter's report. Per weight hx review, pt has lost weighed this since 05/27/2014. Per chart review, pt has lost 30 lbs (15% body weight) in the past 5 months which is significant for time  frame.  Physical assessment indicates moderate muscle and mild fat wasting present. Unable to obtain further information at this time as daughter is interested in talking with MD about plan of care and pending biopsy results. Nutrition needs may need to be adjusted at follow-up if biopsy reveals lymphoma.  Not meeting needs. Medications reviewed. Labs reviewed; Cl: 113 mmol/L, BUN elevated, CBGs: 117 and 135 mg/dL.   Diet Order:  Diet full liquid Room service appropriate?: Yes; Fluid consistency:: Thin  Skin:  Wound (see comment) (Stage 1 R buttocks pressure ulcer, L toe DTI)  Last BM:  9/18  Height:   Ht Readings from Last 1 Encounters:  04/17/15 5\' 11"  (1.803 m)    Weight:   Wt Readings from Last 1 Encounters:  04/19/15 169 lb 8.5 oz (76.9 kg)    Ideal Body Weight:  78.18 kg (kg)  BMI:  Body mass index is 23.66 kg/(m^2).  Estimated Nutritional Needs:   Kcal:  1550-1750  Protein:  75-85 grams  Fluid:  2 L/day  EDUCATION NEEDS:   No education needs identified at this time     Jarome Matin, RD, LDN Inpatient Clinical Dietitian Pager # [phone] After hours/weekend pager # [phone]

## 2015-04-19 NOTE — Telephone Encounter (Signed)
Receive call pt daughter is requesting biopsy results that was done last week. Pls advise...John [name]

## 2015-04-19 NOTE — Telephone Encounter (Signed)
I do not have results yet Thx

## 2015-04-20 ENCOUNTER — Other Ambulatory Visit: Payer: Self-pay | Admitting: *Deleted

## 2015-04-20 ENCOUNTER — Inpatient Hospital Stay (HOSPITAL_COMMUNITY): Payer: Medicare Other

## 2015-04-20 DIAGNOSIS — I4891 Unspecified atrial fibrillation: Secondary | ICD-10-CM

## 2015-04-20 DIAGNOSIS — E440 Moderate protein-calorie malnutrition: Secondary | ICD-10-CM

## 2015-04-20 DIAGNOSIS — N390 Urinary tract infection, site not specified: Secondary | ICD-10-CM

## 2015-04-20 LAB — CBC WITH DIFFERENTIAL/PLATELET
Basophils Absolute: 0 10*3/uL (ref 0.0–0.1)
Basophils Relative: 0 %
Eosinophils Absolute: 0.4 10*3/uL (ref 0.0–0.7)
Eosinophils Relative: 3 %
HCT: 31.6 % — ABNORMAL LOW (ref 39.0–52.0)
HEMOGLOBIN: 9.7 g/dL — AB (ref 13.0–17.0)
LYMPHS ABS: 1.1 10*3/uL (ref 0.7–4.0)
LYMPHS PCT: 7 %
MCH: 27 pg (ref 26.0–34.0)
MCHC: 30.7 g/dL (ref 30.0–36.0)
MCV: 88 fL (ref 78.0–100.0)
Monocytes Absolute: 1.2 10*3/uL — ABNORMAL HIGH (ref 0.1–1.0)
Monocytes Relative: 8 %
NEUTROS ABS: 12.7 10*3/uL — AB (ref 1.7–7.7)
NEUTROS PCT: 83 %
Platelets: 252 10*3/uL (ref 150–400)
RBC: 3.59 MIL/uL — AB (ref 4.22–5.81)
RDW: 17.2 % — ABNORMAL HIGH (ref 11.5–15.5)
WBC: 15.4 10*3/uL — AB (ref 4.0–10.5)

## 2015-04-20 LAB — TYPE AND SCREEN
ABO/RH(D): A POS
Antibody Screen: NEGATIVE
UNIT DIVISION: 0
UNIT DIVISION: 0
Unit division: 0

## 2015-04-20 LAB — GLUCOSE, CAPILLARY: Glucose-Capillary: 136 mg/dL — ABNORMAL HIGH (ref 65–99)

## 2015-04-20 MED ORDER — SODIUM CHLORIDE 0.9 % IJ SOLN
10.0000 mL | INTRAMUSCULAR | Status: DC | PRN
  Administered 2015-04-22: 10 mL
  Filled 2015-04-20: qty 40

## 2015-04-20 MED ORDER — PIPERACILLIN-TAZOBACTAM 3.375 G IVPB
3.3750 g | Freq: Three times a day (TID) | INTRAVENOUS | Status: DC
  Filled 2015-04-20: qty 50

## 2015-04-20 MED ORDER — VANCOMYCIN HCL 10 G IV SOLR
1250.0000 mg | INTRAVENOUS | Status: DC
  Filled 2015-04-20: qty 1250

## 2015-04-20 MED ORDER — PANTOPRAZOLE SODIUM 40 MG PO TBEC
40.0000 mg | DELAYED_RELEASE_TABLET | Freq: Two times a day (BID) | ORAL | Status: DC
  Administered 2015-04-20 – 2015-04-22 (×5): 40 mg via ORAL
  Filled 2015-04-20 (×5): qty 1

## 2015-04-20 NOTE — Progress Notes (Signed)
Received report from [facility] step down Therapist, sports. Pt arrived unit, alert and oriented, able to communicate needs. Md notified of Pt's location. Will continue with current plan of care.

## 2015-04-20 NOTE — Progress Notes (Signed)
Transferred to room 1513 via bed. Dr Charlies Silvers notifed of poor veins.Order received for PICC LINE INSERTION.

## 2015-04-20 NOTE — Progress Notes (Signed)
[facility] Progress Note  Subjective: No signs of further active bleeding. He is tolerating his diet.  Objective: Vital signs in last 24 hours: Temp:  [97.6 F (36.4 C)-98.7 F (37.1 C)] 98 F (36.7 C) (09/21 0800) Pulse Rate:  [73-98] 83 (09/21 0800) Resp:  [6-38] 20 (09/21 0800) BP: (107-127)/(47-70) 120/70 mmHg (09/21 0800) SpO2:  [95 %-100 %] 100 % (09/21 0800) Weight change:    PE:  No acute distress  Heart regular rhythm  Lungs clear  Abdomen soft and nontender  Lab Results: Results for orders placed or performed during the hospital encounter of 04/17/15 (from the past 24 hour(s))  Glucose, capillary     Status: Abnormal   Collection Time: 04/20/15  7:50 AM  Result Value Ref Range   Glucose-Capillary 136 (H) 65 - 99 mg/dL    Studies/Results: No results found.    Assessment: GI bleed secondary to duodenal ulcer. No signs of bleeding at this time.    Plan:   Okay to transfer to floor. I will order a CBC today. Advance diet as tolerated.    SAM F GANEM 04/20/2015, 10:45 AM  Pager: [phone] If no answer or after 5 PM call [phone]

## 2015-04-20 NOTE — Progress Notes (Signed)
Peripherally Inserted Central Catheter/Midline Placement  The IV Nurse has discussed with the patient and/or persons authorized to consent for the patient, the purpose of this procedure and the potential benefits and risks involved with this procedure.  The benefits include less needle sticks, lab draws from the catheter and patient may be discharged home with the catheter.  Risks include, but not limited to, infection, bleeding, blood clot (thrombus formation), and puncture of an artery; nerve damage and irregular heat beat.  Alternatives to this procedure were also discussed.  PICC/Midline Placement Documentation  PICC / Midline Single Lumen 36/43/83 PICC Left Basilic 44 cm 0 cm (Active)  Indication for Insertion or Continuance of Line Poor Vasculature-patient has had multiple peripheral attempts or PIVs lasting less than 24 hours 04/20/2015  2:00 PM  Exposed Catheter (cm) 0 cm 04/20/2015  2:00 PM  Dressing Change Due 04/27/15 04/20/2015  2:00 PM       Jule Economy [place] 04/20/2015, 2:17 PM

## 2015-04-20 NOTE — Progress Notes (Signed)
Patient ID: John Halt., male   DOB: 06/12/1924, 81 y.o.   MRN: [id number] [facility] PROGRESS NOTE  John Halt. ZHY:[id number] DOB: June 15, 1924 DOA: 04/17/2015 PCP: Walker Kehr, MD  Brief narrative:    81 year old male with past medical history of atrial fibrillation (on anticoagulation with coumadin), hypertension, GERD who presented from [facility] center SNF to [facility] ED status post syncopal event the day prior to this admission. Patient also reported dark and melanotic stool.  In ED, BP was 83/48 and he is receiving IV fluids. His HR is 114-129, RR 21-30, T max 99.3 F and oxygen saturation 98% on room air. Blood work is notable for WBC count of 23.2, hemoglobin os 7.4, normal platelets. Potassium was 5.4, INR 2.05. CXR showed no acute cardiopulmonary findings. GI has seen the pt in consultation and recommended supportive care. So far, pt has received so far 3 units of PRBC transfusion (2 units on admission and 1 unit 04/19/2015).   Anticipated discharge: Transfer to telemetry floor today.   Assessment/Plan:    Principal Problem: Hemorrhagic shock / Acute upper GI hemorrhage / Acute blood loss anemia in the setting of anticoagulation - Pt presented with dark and melanotic stool concerning for upper GI bleed. He is on coumadin which likely has exacerbated the GI bleed - He was hypotensive on admission and has required 2 units of PRBC transfusion and admission to SDU for close monitoring of GI bleed - His hemoglobin improved to 8.4 and then trended down to 7.4 9/20 for which reason he has gotten the 3rd unit of PRBC - Today, hemoglobin is stable at 9.7 - We will transfer to telemetry floor today - He is hemodynamically stable - As always, we appreciate GI input. They has seen the in consultation with recommendations for supportive care  - We will continue to hold coumadin at least for next 24 hours to make sure Hgb remains stable.  Active Problems: Sepsis, unspecified  organism / Leukocytosis - Sepsis criteria met on admission with tachycardia, tachypnea, hypotension, leukocytosis, elevated lactic acid. Procalcitonin was elevated at 1.87. Source of infection likely UTI. Small leukocytes seen on admission urinalysis.  - Pt on vanco and zosyn on admission through 04/19/2015. Switched to Rocephin starting 04/19/2015.  Syncope - Likely in the setting of acute hemorrhagic shock and hypotension - Per PT - 24 hours supervision versus SNF   Atrial fibrillation / On anticoagulant therapy - CHADS vasc score 4 - Coumadin on hold due to GI bleed - Metoprolol held on admission due to hypotension. We will resume today since BP stable.    Benign essential HTN - Resume metoprolol today. Was on hold due to hypotension.    Hyperkalemia - Secondary to sepsis - Resolved   Retroperitoneal lymphadenopathy / Lymph nodes enlarged - Based on recet CT abdomen pt was found to have bulky lymphadenopathy - Underwent recent biopsy 9/14- results pending as of 9/201 (not sure why is taking so long for report to be available).  Moisture associated skin damage  - Intact skin with non blanchable redness over bony prominence  - WOC consulted. Pt has erythema on buttocks (at apex of gluteal cleft), moisture associated skin damage (not a pressure injury). Measurement:8cm x 0.2cm x 0.2cm - Dressing procedure/placement/frequency: Patient is incontinent of urine and stool and it was recommended turning side to side and avoiding the supine position while in bed. May use moisture barrier product to protect and repair tissue. Prevalon boots provided to redistribute pressure  to heels.  DVT prophylaxis:  - Anticoagulation on hold due to GI bleed - Using SCD's bilaterally     Code Status: Full.  Family Communication:  plan of care discussed with the patient; family not at the bedside this am; spoke with the family 9/21. Disposition Plan: transfer to telemetry today.   IV access:   Peripheral IV  Procedures and diagnostic studies:    Dg Chest Portable 1 View 04/17/2015  No active disease.   Electronically Signed   By: Skipper Cliche M.D.   On: 04/17/2015 09:31   Medical Consultants:  Gastroenterology, Anson Fret  Other Consultants:  Physical therapy Nutrition  IAnti-Infectives:   Vanco and zosyn from admission to 9/20 Rocephin from 04/19/2015 -->    Leisa Lenz, MD  [facility] Pager [phone]  Time spent in minutes: 25 minutes  If 7PM-7AM, please contact night-coverage [email or web address] Password [facility] 04/20/2015, 12:50 PM   LOS: 3 days    HPI/Subjective: No acute overnight events. No reports of bleeding.   Objective: Filed Vitals:   04/20/15 0500 04/20/15 0600 04/20/15 0800 04/20/15 1125  BP:  116/61 120/70 111/67  Pulse:   83 111  Temp: 98.3 F (36.8 C)  98 F (36.7 C) 97.5 F (36.4 C)  TempSrc: Oral  Oral Oral  Resp:  $Remo'25 20 20  'GhNSb$ Height:      Weight:      SpO2:  100% 100% 99%    Intake/Output Summary (Last 24 hours) at 04/20/15 1250 Last data filed at 04/19/15 1641  Gross per 24 hour  Intake 1286.25 ml  Output      0 ml  Net 1286.25 ml    Exam:   General:  Pt is alert, no distress  Cardiovascular: slightly tachycardic, (+) S1, S2  Respiratory: no wheezing, no rhonchi   Abdomen: appreciate bowel sounds, non tender abd  Extremities: No swelling, palpable pulses   Neuro: No focal deficits   Data Reviewed: Basic Metabolic Panel:  Recent Labs Lab 04/17/15 0853 04/17/15 2208 04/18/15 0403 04/19/15 0350  NA 140 143 143 144  K 5.4* 4.1 3.9 3.7  CL 106 113* 113* 113*  CO2 $Re'24 24 22 23  'YYY$ GLUCOSE 210* 130* 130* 133*  BUN 58* 50* 45* 29*  CREATININE 1.17 0.81 0.82 0.79  CALCIUM 9.9 9.2 9.2 9.1  MG  --  1.7  --   --   PHOS  --  3.0  --   --    Liver Function Tests:  Recent Labs Lab 04/17/15 0853 04/17/15 2208  AST 40 30  ALT 14* 11*  ALKPHOS 86 71  BILITOT 0.4 1.3*  PROT 6.6 5.8*  ALBUMIN 2.0* 1.9*     Recent Labs Lab 04/17/15 0853  LIPASE 30   No results for input(s): AMMONIA in the last 168 hours. CBC:  Recent Labs Lab 04/17/15 0853 04/17/15 2208 04/18/15 0403 04/19/15 0350 04/20/15 1143  WBC 23.2* 18.7* 18.8* 15.2* 15.4*  NEUTROABS  --  16.1*  --   --  12.7*  HGB 7.4* 8.4* 8.4* 7.4* 9.7*  HCT 24.8* 26.6* 27.0* 24.4* 31.6*  MCV 87.6 86.1 85.2 86.8 88.0  PLT 301 247 228 233 252   Cardiac Enzymes: No results for input(s): CKTOTAL, CKMB, CKMBINDEX, TROPONINI in the last 168 hours. BNP: Invalid input(s): POCBNP CBG:  Recent Labs Lab 04/18/15 0826 04/19/15 0837 04/20/15 0750  GLUCAP 117* 135* 136*    Recent Results (from the past 240 hour(s))  Culture, blood (x 2)  Status: None (Preliminary result)   Collection Time: 04/17/15 10:39 AM  Result Value Ref Range Status   Specimen Description BLOOD LEFT ANTECUBITAL  Final   Special Requests BOTTLES DRAWN AEROBIC AND ANAEROBIC 5 [place]  Final   Culture   Final    NO GROWTH 3 DAYS Performed at [facility]    Report Status PENDING  Incomplete  Culture, blood (x 2)     Status: None (Preliminary result)   Collection Time: 04/17/15 10:39 AM  Result Value Ref Range Status   Specimen Description BLOOD LEFT HAND  Final   Special Requests IN PEDIATRIC BOTTLE 1 CC  Final   Culture   Final    NO GROWTH 3 DAYS Performed at [facility]    Report Status PENDING  Incomplete  MRSA PCR Screening     Status: None   Collection Time: 04/17/15  5:00 PM  Result Value Ref Range Status   MRSA by PCR NEGATIVE NEGATIVE Final  Culture, Urine     Status: None   Collection Time: 04/17/15  6:00 PM  Result Value Ref Range Status   Specimen Description URINE, CLEAN CATCH  Final   Special Requests NONE  Final   Culture   Final    NO GROWTH 1 DAY Performed at [facility]    Report Status 04/19/2015 FINAL  Final     Scheduled Meds: . antiseptic oral rinse  7 mL Mouth Rinse q12n4p  . cefTRIAXone  (ROCEPHIN)  IV  1 g Intravenous Q0600  . chlorhexidine  15 mL Mouth Rinse BID  . feeding supplement (ENSURE ENLIVE)  237 mL Oral BID BM  . pantoprazole  40 mg Oral BID  . sodium chloride  3 mL Intravenous Q12H   Continuous Infusions: . sodium chloride 75 mL/hr at 04/20/15 0113

## 2015-04-20 NOTE — Telephone Encounter (Signed)
Called daughter no answer LMOM with md response...Johny Chess

## 2015-04-20 NOTE — Progress Notes (Signed)
Physical Therapy Treatment Patient Details Name: Julious Langlois. MRN: [id number] DOB: 1923/09/18 Today's Date: 04/20/2015    History of Present Illness 81 year old male with past medical history of atrial fibrillation , hypertension, GERD who presented from [facility] center SNF to [facility] ED 04/17/15  status post syncopal event the day prior to this admission. Patient also reported dark and melanotic stool. BP 83/48, hgb 7.4.    PT Comments    Pt cooperative and progressing with mobility.  Ltd this am by fatigue and bowel incontinence.  Follow Up Recommendations  SNF     Equipment Recommendations  None recommended by PT    Recommendations for Other Services       Precautions / Restrictions Precautions Precautions: Fall Precaution Comments: monitor VS, incontinence, has Depends Restrictions Weight Bearing Restrictions: No    Mobility  Bed Mobility Overal bed mobility: Needs Assistance Bed Mobility: Supine to Sit;Sit to Supine     Supine to sit: Min assist;Mod assist Sit to supine: Min assist;Mod assist   General bed mobility comments: min cues for sequence with assist for bringing trunk to upright and to bring legs back into bed  Transfers Overall transfer level: Needs assistance Equipment used: Rolling walker (2 wheeled) Transfers: Sit to/from Stand Sit to Stand: Min assist;From elevated surface;+2 physical assistance;+2 safety/equipment         General transfer comment: steady assist to rise from the bed, cues to reach to recliner  Ambulation/Gait Ambulation/Gait assistance: Min assist;+2 physical assistance;+2 safety/equipment Ambulation Distance (Feet): 4 Feet Assistive device: Rolling walker (2 wheeled) Gait Pattern/deviations: Step-to pattern;Decreased step length - right;Decreased step length - left;Shuffle;Trunk flexed     General Gait Details: pt side stepped up side of bed.  Pt stood to change depends, used urinal and became incontinent of bowel.  Pt  stood for hygiene - maintained standing with RW and min assist for ~ 15 min total    Stairs            Wheelchair Mobility    Modified Rankin (Stroke Patients Only)       Balance                                    Cognition Arousal/Alertness: Awake/alert Behavior During Therapy: WFL for tasks assessed/performed Overall Cognitive Status: History of cognitive impairments - at baseline                      Exercises      General Comments        Pertinent Vitals/Pain      Home Living                      Prior Function            PT Goals (current goals can now be found in the care plan section) Acute Rehab PT Goals Patient Stated Goal: agreed to OOB. PT Goal Formulation: With patient/family Time For Goal Achievement: 05/03/15 Potential to Achieve Goals: Good Progress towards PT goals: Progressing toward goals    Frequency  Min 3X/week    PT Plan Current plan remains appropriate    Co-evaluation             End of Session Equipment Utilized During Treatment: Gait belt Activity Tolerance: Patient limited by fatigue;Patient tolerated treatment well Patient left: in chair;with call bell/phone within reach  Time: 1015-1050 PT Time Calculation (min) (ACUTE ONLY): 35 min  Charges:  $Therapeutic Activity: 23-37 mins                    G Codes:      BRADSHAW,HUNTER 04-25-2015, 2:33 PM

## 2015-04-21 LAB — BASIC METABOLIC PANEL
ANION GAP: 5 (ref 5–15)
BUN: 14 mg/dL (ref 6–20)
CHLORIDE: 109 mmol/L (ref 101–111)
CO2: 25 mmol/L (ref 22–32)
Calcium: 8.9 mg/dL (ref 8.9–10.3)
Creatinine, Ser: 0.65 mg/dL (ref 0.61–1.24)
GFR calc Af Amer: >60 mL/min (ref >60)
GLUCOSE: 178 mg/dL — AB (ref 65–99)
POTASSIUM: 3.7 mmol/L (ref 3.5–5.1)
SODIUM: 139 mmol/L (ref 135–145)

## 2015-04-21 LAB — CBC
HCT: 29.1 % — ABNORMAL LOW (ref 39.0–52.0)
HEMOGLOBIN: 8.8 g/dL — AB (ref 13.0–17.0)
MCH: 26.6 pg (ref 26.0–34.0)
MCHC: 30.2 g/dL (ref 30.0–36.0)
MCV: 87.9 fL (ref 78.0–100.0)
PLATELETS: 236 10*3/uL (ref 150–400)
RBC: 3.31 MIL/uL — AB (ref 4.22–5.81)
RDW: 17 % — ABNORMAL HIGH (ref 11.5–15.5)
WBC: 16.4 10*3/uL — AB (ref 4.0–10.5)

## 2015-04-21 LAB — GLUCOSE, CAPILLARY: Glucose-Capillary: 104 mg/dL — ABNORMAL HIGH (ref 65–99)

## 2015-04-21 MED ORDER — PANTOPRAZOLE SODIUM 40 MG PO TBEC: 40.0000 mg | DELAYED_RELEASE_TABLET | Freq: Two times a day (BID) | ORAL | Status: AC

## 2015-04-21 MED ORDER — ACETAMINOPHEN 325 MG PO TABS: 650.0000 mg | ORAL_TABLET | Freq: Four times a day (QID) | ORAL | Status: AC | PRN

## 2015-04-21 MED ORDER — CETYLPYRIDINIUM CHLORIDE 0.05 % MT LIQD: 7.0000 mL | Freq: Two times a day (BID) | OROMUCOSAL | Status: AC

## 2015-04-21 MED ORDER — ASPIRIN EC 81 MG PO TBEC: 81.0000 mg | DELAYED_RELEASE_TABLET | Freq: Every day | ORAL | Status: AC

## 2015-04-21 MED ORDER — CIPROFLOXACIN HCL 500 MG PO TABS: 500.0000 mg | ORAL_TABLET | Freq: Two times a day (BID) | ORAL | Status: AC

## 2015-04-21 MED ORDER — ENSURE ENLIVE PO LIQD: 237.0000 mL | Freq: Two times a day (BID) | ORAL | Status: AC

## 2015-04-21 NOTE — Discharge Instructions (Signed)
Gastrointestinal Bleeding Gastrointestinal (GI) bleeding means there is bleeding somewhere along the digestive tract, between the mouth and anus. CAUSES  There are many different problems that can cause GI bleeding. Possible causes include:  Esophagitis. This is inflammation, irritation, or swelling of the esophagus.  Hemorrhoids.These are veins that are full of blood (engorged) in the rectum. They cause pain, inflammation, and may bleed.  Anal fissures.These are areas of painful tearing which may bleed. They are often caused by passing hard stool.  Diverticulosis.These are pouches that form on the colon over time, with age, and may bleed significantly.  Diverticulitis.This is inflammation in areas with diverticulosis. It can cause pain, fever, and bloody stools, although bleeding is rare.  Polyps and cancer. Colon cancer often starts out as precancerous polyps.  Gastritis and ulcers.Bleeding from the upper gastrointestinal tract (near the stomach) may travel through the intestines and produce black, sometimes tarry, often bad smelling stools. In certain cases, if the bleeding is fast enough, the stools may not be black, but red. This condition may be life-threatening. SYMPTOMS   Vomiting bright red blood or material that looks like coffee grounds.  Bloody, black, or tarry stools. DIAGNOSIS  Your caregiver may diagnose your condition by taking your history and performing a physical exam. More tests may be needed, including:  X-rays and other imaging tests.  Esophagogastroduodenoscopy (EGD). This test uses a flexible, lighted tube to look at your esophagus, stomach, and small intestine.  Colonoscopy. This test uses a flexible, lighted tube to look at your colon. TREATMENT  Treatment depends on the cause of your bleeding.   For bleeding from the esophagus, stomach, small intestine, or colon, the caregiver doing your EGD or colonoscopy may be able to stop the bleeding as part of  the procedure.  Inflammation or infection of the colon can be treated with medicines.  Many rectal problems can be treated with creams, suppositories, or warm baths.  Surgery is sometimes needed.  Blood transfusions are sometimes needed if you have lost a lot of blood. If bleeding is slow, you may be allowed to go home. If there is a lot of bleeding, you will need to stay in the hospital for observation. HOME CARE INSTRUCTIONS   Take any medicines exactly as prescribed.  Keep your stools soft by eating foods that are high in fiber. These foods include whole grains, legumes, fruits, and vegetables. Prunes (1 to 3 a day) work well for many people.  Drink enough fluids to keep your urine clear or pale yellow. SEEK IMMEDIATE MEDICAL CARE IF:  Ciprofloxacin tablets What is this medicine? CIPROFLOXACIN (sip roe FLOX a sin) is a quinolone antibiotic. It is used to treat certain kinds of bacterial infections. It will not work for colds, flu, or other viral infections. This medicine may be used for other purposes; ask your health care provider or pharmacist if you have questions. COMMON BRAND NAME(S): Cipro What should I tell my health care provider before I take this medicine? They need to know if you have any of these conditions: -bone problems -cerebral disease -joint problems -irregular heartbeat -kidney disease -liver disease -myasthenia gravis -seizure disorder -tendon problems -an unusual or allergic reaction to ciprofloxacin, other antibiotics or medicines, foods, dyes, or preservatives -pregnant or trying to get pregnant -breast-feeding How should I use this medicine? Take this medicine by mouth with a glass of water. Follow the directions on the prescription label. Take your medicine at regular intervals. Do not take your medicine more often than  directed. Take all of your medicine as directed even if you think your are better. Do not skip doses or stop your medicine  early. You can take this medicine with food or on an empty stomach. It can be taken with a meal that contains dairy or calcium, but do not take it alone with a dairy product, like milk or yogurt or calcium-fortified juice. A special MedGuide will be given to you by the pharmacist with each prescription and refill. Be sure to read this information carefully each time. Talk to your pediatrician regarding the use of this medicine in children. Special care may be needed. Overdosage: If you think you have taken too much of this medicine contact a poison control center or emergency room at once. NOTE: This medicine is only for you. Do not share this medicine with others. What if I miss a dose? If you miss a dose, take it as soon as you can. If it is almost time for your next dose, take only that dose. Do not take double or extra doses. What may interact with this medicine? Do not take this medicine with any of the following medications: -cisapride -droperidol -terfenadine -tizanidine This medicine may also interact with the following medications: -antacids -birth control pills -caffeine -cyclosporin -didanosine (ddI) buffered tablets or powder -medicines for diabetes -medicines for inflammation like ibuprofen, naproxen -methotrexate -multivitamins -omeprazole -phenytoin -probenecid -sucralfate -theophylline -warfarin This list may not describe all possible interactions. Give your health care provider a list of all the medicines, herbs, non-prescription drugs, or dietary supplements you use. Also tell them if you smoke, drink alcohol, or use illegal drugs. Some items may interact with your medicine. What should I watch for while using this medicine? Tell your doctor or health care professional if your symptoms do not improve. Do not treat diarrhea with over the counter products. Contact your doctor if you have diarrhea that lasts more than 2 days or if it is severe and watery. You may get  drowsy or dizzy. Do not drive, use machinery, or do anything that needs mental alertness until you know how this medicine affects you. Do not stand or sit up quickly, especially if you are an older patient. This reduces the risk of dizzy or fainting spells. This medicine can make you more sensitive to the sun. Keep out of the sun. If you cannot avoid being in the sun, wear protective clothing and use sunscreen. Do not use sun lamps or tanning beds/booths. Avoid antacids, aluminum, calcium, iron, magnesium, and zinc products for 6 hours before and 2 hours after taking a dose of this medicine. What side effects may I notice from receiving this medicine? Side effects that you should report to your doctor or health care professional as soon as possible: - allergic reactions like skin rash, itching or hives, swelling of the face, lips, or tongue - breathing problems - confusion, nightmares or hallucinations - feeling faint or lightheaded, falls - irregular heartbeat - joint, muscle or tendon pain or swelling - pain or trouble passing urine -persistent headache with or without blurred vision - redness, blistering, peeling or loosening of the skin, including inside the mouth - seizure - unusual pain, numbness, tingling, or weakness Side effects that usually do not require medical attention (report to your doctor or health care professional if they continue or are bothersome): - diarrhea - nausea or stomach upset - white patches or sores in the mouth This list may not describe all possible side effects. Call your  doctor for medical advice about side effects. You may report side effects to FDA at 1-800-FDA-1088. Where should I keep my medicine? Keep out of the reach of children. Store at room temperature below 30 degrees C (86 degrees F). Keep container tightly closed. Throw away any unused medicine after the expiration date. NOTE: This sheet is a summary. It may not cover all possible  information. If you have questions about this medicine, talk to your doctor, pharmacist, or health care provider.  2015, Elsevier/Gold Standard. (2013-02-19 16:10:46)  Your bleeding increases.  You feel lightheaded, weak, or you faint.  You have severe cramps in your back or abdomen.  You pass large blood clots in your stool.  Your problems are getting worse. MAKE SURE YOU:   Understand these instructions.  Will watch your condition.  Will get help right away if you are not doing well or get worse. Document Released: 07/13/2000 Document Revised: 07/02/2012 Document Reviewed: 06/25/2011 [facility] Patient Information 2015 [place], [place]. This information is not intended to replace advice given to you by your health care provider. Make sure you discuss any questions you have with your health care provider. Pantoprazole tablets What is this medicine? PANTOPRAZOLE (pan TOE pra zole) prevents the production of acid in the stomach. It is used to treat gastroesophageal reflux disease (GERD), inflammation of the esophagus, and Zollinger-Ellison syndrome. This medicine may be used for other purposes; ask your health care provider or pharmacist if you have questions. COMMON BRAND NAME(S): Protonix What should I tell my health care provider before I take this medicine? They need to know if you have any of these conditions: -liver disease -low levels of magnesium in the blood -an unusual or allergic reaction to omeprazole, lansoprazole, pantoprazole, rabeprazole, other medicines, foods, dyes, or preservatives -pregnant or trying to get pregnant -breast-feeding How should I use this medicine? Take this medicine by mouth. Swallow the tablets whole with a drink of water. Follow the directions on the prescription label. Do not crush, break, or chew. Take your medicine at regular intervals. Do not take your medicine more often than directed. Talk to your pediatrician regarding the use of this  medicine in children. While this drug may be prescribed for children as young as 5 years for selected conditions, precautions do apply. Overdosage: If you think you have taken too much of this medicine contact a poison control center or emergency room at once. NOTE: This medicine is only for you. Do not share this medicine with others. What if I miss a dose? If you miss a dose, take it as soon as you can. If it is almost time for your next dose, take only that dose. Do not take double or extra doses. What may interact with this medicine? Do not take this medicine with any of the following medications: -atazanavir -nelfinavir This medicine may also interact with the following medications: -ampicillin -delavirdine -digoxin -diuretics -iron salts -medicines for fungal infections like ketoconazole, itraconazole and voriconazole -warfarin This list may not describe all possible interactions. Give your health care provider a list of all the medicines, herbs, non-prescription drugs, or dietary supplements you use. Also tell them if you smoke, drink alcohol, or use illegal drugs. Some items may interact with your medicine. What should I watch for while using this medicine? It can take several days before your stomach pain gets better. Check with your doctor or health care professional if your condition does not start to get better, or if it gets worse. You may  need blood work done while you are taking this medicine. What side effects may I notice from receiving this medicine? Side effects that you should report to your doctor or health care professional as soon as possible: -allergic reactions like skin rash, itching or hives, swelling of the face, lips, or tongue -bone, muscle or joint pain -breathing problems -chest pain or chest tightness -dark yellow or brown urine -dizziness -fast, irregular heartbeat -feeling faint or lightheaded -fever or sore throat -muscle  spasm -palpitations -redness, blistering, peeling or loosening of the skin, including inside the mouth -seizures -tremors -unusual bleeding or bruising -unusually weak or tired -yellowing of the eyes or skin Side effects that usually do not require medical attention (Report these to your doctor or health care professional if they continue or are bothersome.): -constipation -diarrhea -dry mouth -headache -nausea This list may not describe all possible side effects. Call your doctor for medical advice about side effects. You may report side effects to FDA at 1-800-FDA-1088. Where should I keep my medicine? Keep out of the reach of children. Store at room temperature between 15 and 30 degrees C (59 and 86 degrees F). Protect from light and moisture. Throw away any unused medicine after the expiration date. NOTE: This sheet is a summary. It may not cover all possible information. If you have questions about this medicine, talk to your doctor, pharmacist, or health care provider.  2015, Elsevier/Gold Standard. (2012-05-14 16:40:16)

## 2015-04-21 NOTE — Discharge Summary (Addendum)
Physician Discharge Summary  John [name]. BHA:[id number] DOB: 11-27-1923 DOA: 04/17/2015  PCP: Walker Kehr, MD  Admit date: 04/17/2015 Discharge date: 04/22/2015  Recommendations for Outpatient Follow-up:  1. Please continue cipro for 2 days on discharge.  2. Please continue to hold anticoagulation for about 5-7 days on discharge, please recheck hemoglobin to make sure it is stable. Hemoglobin is 8.8 on 04/21/15. If hemoglobin is stable and there is no evidence of bleed then it would be safe to resume anticoagulation (patietn was on coumadin, Lovenox for bridging). 3. Blood and urine cultures showed no growth. 4. Oncology will follow up on discharge in regards to final results of biopsy which as of 04/21/15 was lymphoproliferative disorder based on preliminary report from pathology.  Discharge Diagnoses:  Principal Problem:   Sepsis Active Problems:   Acute upper GI hemorrhage   Leukocytosis   Acute blood loss anemia   Syncope   Hemorrhagic shock   Atrial fibrillation   Benign essential HTN   On anticoagulant therapy   Hyperkalemia   Lymph nodes enlarged   Retroperitoneal lymphadenopathy   UTI (urinary tract infection)   Pressure ulcer, stage 1   Malnutrition of moderate degree    Discharge Condition: stable   Diet recommendation: as tolerated   History of present illness:  81 year old male with past medical history of atrial fibrillation (on anticoagulation with coumadin), hypertension, GERD who presented from [facility] SNF to [facility] ED status post syncopal event the day prior to this admission. Patient also reported dark and melanotic stool.  In ED, BP was 83/48 and he is receiving IV fluids. His HR is 114-129, RR 21-30, T max 99.3 F and oxygen saturation 98% on room air. Blood work is notable for WBC count of 23.2, hemoglobin os 7.4, normal platelets. Potassium was 5.4, INR 2.05. CXR showed no acute cardiopulmonary findings. GI has seen the pt in consultation and  recommended supportive care. So far, pt has received so far 3 units of PRBC transfusion (2 units on admission and 1 unit 04/19/2015).    Hospital Course:    Assessment/Plan:    Principal Problem: Hemorrhagic shock / Acute upper GI hemorrhage / Acute blood loss anemia in the setting of anticoagulation - Pt presented with dark and melanotic stool which was concerning for upper GI bleed. He is on coumadin which likely has exacerbated the GI bleed - Pt was hypotensive on admission and during this hospital stay he has received total of 3 units of PRBC (last unit was given 04/19/15) - Pt was seen by GI in consultation with recommendation for supportive care and holding [facility] as long as safely possible due to risk of duodenal bleed - My recommendation on discharge is to hold any AC at least for next 5-7 days on discharge and use aspirin instead. If hgb is rechecked in SNF and if stable and no evidence of bleed then anticoagulation may be resumed.  Active Problems: Sepsis, unspecified organism / Leukocytosis - Sepsis criteria met on admission with tachycardia, tachypnea, hypotension, leukocytosis, elevated lactic acid. Procalcitonin was elevated at 1.87. Source of infection likely UTI. Small leukocytes seen on admission urinalysis.  - Pt on vanco and zosyn on admission through 04/19/2015. Switched to Rocephin starting 04/19/2015. - Blood and urine culture so far show no growth. - Pt will continue cipro for 2 days on discharge.  Syncope - Likely in the setting of acute hemorrhagic shock and hypotension - Per PT - 24 hours supervision versus SNF. Per  family wishes they prefer [facility].   Atrial fibrillation / On anticoagulant therapy - CHADS vasc score 4 - Coumadin on hold due to GI bleed - May resume metoprolol on discharge    Benign essential HTN - Resume metoprolol on discharge.    Hyperkalemia - Secondary to sepsis - Resolved   Retroperitoneal lymphadenopathy / Lymph  nodes enlarged - Based on recet CT abdomen pt was found to have bulky lymphadenopathy - Underwent recent biopsy 9/14- lymphoproliferative disorder.  - Oncology to see prior to discharge to set up initial visit and follow up then from taht on potential if any treatments available based on final path report.  Moisture associated skin damage  - Intact skin with non blanchable redness over bony prominence  - WOC consulted. Pt has erythema on buttocks (at apex of gluteal cleft), moisture associated skin damage (not a pressure injury). Measurement:8cm x 0.2cm x 0.2cm - Dressing procedure/placement/frequency: Patient is incontinent of urine and stool and it was recommended turning side to side and avoiding the supine position while in bed. May use moisture barrier product to protect and repair tissue. Prevalon boots provided to redistribute pressure to heels.  DVT prophylaxis:  - Anticoagulation on hold due to GI bleed - Used SCD's bilaterally in hospital     Code Status: Full.  Family Communication: plan of care discussed with the patient; family not at the bedside this am; spoke with the family daily.   IV access:  Peripheral IV  Procedures and diagnostic studies:   Dg Chest Portable 1 View 04/17/2015 No active disease.   Medical Consultants:  Gastroenterology, [facility]  Other Consultants:  Physical therapy Nutrition  IAnti-Infectives:   Vanco and zosyn from admission to 9/20 Rocephin from 04/19/2015 --> 04/22/2015   Signed:  Leisa Lenz, MD  [facility] 04/21/2015, 10:04 PM  Pager #: [phone]  Time spent in minutes: more than 30 minutes  Discharge Exam: Filed Vitals:   04/21/15 2139  BP: 121/67  Pulse: 96  Temp: 98.3 F (36.8 C)  Resp: 20   Filed Vitals:   04/20/15 2124 04/21/15 0417 04/21/15 1507 04/21/15 2139  BP: 133/67 124/76 133/83 121/67  Pulse: 114 102 113 96  Temp: 98 F (36.7 C) 98.4 F (36.9 C) 98 F  (36.7 C) 98.3 F (36.8 C)  TempSrc: Oral Oral Oral Oral  Resp: $Remo'18 20 20 20  'EIoEE$ Height:      Weight:  78.971 kg (174 lb 1.6 oz)    SpO2: 96% 96% 95% 99%    General: Pt is alert, not in acute distress Cardiovascular: slight tachycardia, S1/S2 +, no murmurs Respiratory: bilateral air entry, no wheezing  Abdominal: Soft, non tender, non distended, bowel sounds +, no guarding Extremities: no edema, no cyanosis, pulses palpable bilaterally DP and PT Neuro: Grossly nonfocal  Discharge Instructions  Discharge Instructions    Call MD for:  persistant dizziness or light-headedness    Complete by:  As directed      Call MD for:  redness, tenderness, or signs of infection (pain, swelling, redness, odor or green/yellow discharge around incision site)    Complete by:  As directed      Call MD for:  severe uncontrolled pain    Complete by:  As directed      Diet - low sodium heart healthy    Complete by:  As directed      Discharge instructions    Complete by:  As directed   1. Please continue  cipro for 2 days on discharge.  2. Please continue to hold anticoagulation for about 5-7 days on discharge, please recheck hemoglobin to make sure it is stable. Hemoglobin is 8.8 on 04/21/15. If hemoglobin is stable and there is no evidence of bleed then it would be safe to resume anticoagulation (patietn was on coumadin, Lovenox for bridging). 3. Blood and urine cultures showed no growth. 4. Oncology will follow up on discharge in regards to final results of biopsy which as of 04/21/15 was lymphoproliferative disorder based on preliminary report from pathology.     Increase activity slowly    Complete by:  As directed             Medication List    STOP taking these medications        enoxaparin 80 MG/0.8ML injection  Commonly known as:  LOVENOX     omeprazole 40 MG capsule  Commonly known as:  PRILOSEC     TYLENOL PM EXTRA STRENGTH PO     warfarin 2.5 MG tablet  Commonly known as:  COUMADIN       TAKE these medications        acetaminophen 325 MG tablet  Commonly known as:  TYLENOL  Take 2 tablets (650 mg total) by mouth every 6 (six) hours as needed for mild pain (or Fever >/= 101).     amLODipine 2.5 MG tablet  Commonly known as:  NORVASC  Take 2.5 mg by mouth 2 (two) times daily.     antiseptic oral rinse 0.05 % Liqd solution  Commonly known as:  CPC / CETYLPYRIDINIUM CHLORIDE 0.05%  7 mLs by Mouth Rinse route 2 times daily at 12 noon and 4 pm.     aspirin EC 81 MG tablet  Take 1 tablet (81 mg total) by mouth daily.     ciprofloxacin 500 MG tablet  Commonly known as:  CIPRO  Take 1 tablet (500 mg total) by mouth 2 (two) times daily.     feeding supplement (ENSURE ENLIVE) Liqd  Take 237 mLs by mouth 2 (two) times daily between meals.     ferrous sulfate 325 (65 FE) MG tablet  Take 1 tablet (325 mg total) by mouth daily with breakfast.     fluocinonide 0.05 % external solution  Commonly known as:  LIDEX  APPLY ON THE SCALP DAILY     metoprolol succinate 50 MG 24 hr tablet  Commonly known as:  TOPROL-XL  Take 1 tablet (50 mg total) by mouth 2 (two) times daily. Take with or immediately following a meal.     pantoprazole 40 MG tablet  Commonly known as:  PROTONIX  Take 1 tablet (40 mg total) by mouth 2 (two) times daily.     polyethylene glycol packet  Commonly known as:  MIRALAX / GLYCOLAX  Take 17 g by mouth at bedtime.     Vitamin D 1000 UNITS capsule  Take 1,000 Units by mouth daily.             Follow-up Information    Follow up with Walker Kehr, MD. Schedule an appointment as soon as possible for a visit in 1 week.   Specialty:  Internal Medicine   Why:  Follow up appt after recent hospitalization   Contact information:   [address] [place] [address] [phone]        The results of significant diagnostics from this hospitalization (including imaging, microbiology, ancillary and laboratory) are listed below for  reference.  Significant Diagnostic Studies: Ct Abdomen Pelvis W Contrast  03/24/2015   CLINICAL DATA:  Recent weight loss with weakness, history bowel surgery  EXAM: CT ABDOMEN AND PELVIS WITH CONTRAST  TECHNIQUE: Multidetector CT imaging of the abdomen and pelvis was performed using the standard protocol following bolus administration of intravenous contrast.  CONTRAST:  100 mL Omnipaque 300  COMPARISON:  05/12/2004  FINDINGS: Lung bases demonstrate mild right basilar scarring. No focal infiltrate or sizable effusion is seen.  The liver again demonstrates a hypodense lesion within the left lobe consistent with a small cysts. The spleen, gallbladder, adrenal glands and pancreas are within normal limits. The kidneys are well visualized bilaterally and again demonstrate bilateral renal cysts right greater than left. The largest of these lies within the midportion of the right kidney measuring 5 cm in greatest dimension.  There is new bulky lymphadenopathy identified in the pericaval and periaortic region. The largest of these areas lies interposed between the aorta in the left kidney and measures approximately 4.1 by 4.3 cm. This is best seen on image number 27 of series 2. A 9 mm short axis lymph node is noted adjacent to the distal esophagus best seen on image number 7 of series 2 a large lymph node complex is noted in the portacaval space best seen on image number 22 of series 2. This measures 3.6 x 5.9 cm in greatest dimension.  The gastric wall is diffusely thickened although this is of uncertain significance as nose distension is seen. Postsurgical changes are noted with partial colonic resection. No obstructive changes are seen. Contrast passes into the colon without difficulty. Aortoiliac calcifications are noted without aneurysmal dilatation.  The bladder is well distended. The prostate is mildly enlarged. No pelvic lymphadenopathy is seen. A large left inguinal hernia is noted with colon within. No  incarceration is noted. No significant inguinal lymphadenopathy is noted. The osseous structures show changes of prior left hip replacement and degenerative change of the lumbar spine. No compression deformities are seen.  IMPRESSION: Stable hepatic and renal cysts.  Multiple lymph nodes as described above predominately in the periaortic, pericaval and portacaval space. These are highly suggestive of underlying lymphoma. Some of these in the periaortic and pericaval region would be amenable to percutaneous biopsy.  Diffuse thickening of the gastric wall of uncertain significance as there is no significant distension identified.  These results will be called to the ordering clinician or representative by the Radiologist Assistant, and communication documented in the PACS or zVision Dashboard.   Electronically Signed   By: Inez Catalina M.D.   On: 03/24/2015 07:52   Ct Biopsy  04/13/2015   CLINICAL DATA:  81 year old male with newly diagnosed extensive retroperitoneal and abdominal adenopathy concerning for lymphoma. CT-guided biopsy of retroperitoneal adenopathy is warranted for tissue diagnosis.  EXAM: CT BIOPSY  Date: 04/13/2015  PROCEDURE: 1. CT-guided core biopsy left para-aortic retroperitoneal adenopathy Interventional Radiologist:  Criselda Peaches, MD  ANESTHESIA/SEDATION: Moderate (conscious) sedation was used. 0.5 mg Versed, 12.5 mcg Fentanyl were administered intravenously. The patient's vital signs were monitored continuously by radiology nursing throughout the procedure.  Sedation Time: 6 minutes  MEDICATIONS: None additional  TECHNIQUE: Informed consent was obtained from the patient following explanation of the procedure, risks, benefits and alternatives. The patient understands, agrees and consents for the procedure. All questions were addressed. A time out was performed.  A planning axial CT scan was performed. Bulky adenopathy in the left para-aortic station was successfully identified. A  suitable skin  entry site was selected and marked. The region was then sterilely prepped and draped in standard fashion using Betadine skin prep. Local anesthesia was attained by infiltration with 1% lidocaine. A small dermatotomy was made. Under intermittent CT fluoroscopic guidance, a 17 gauge introducer needle was advanced to the margin of the retroperitoneal mass. Multiple 18 gauge core biopsies were then coaxially obtained using the bio Pince automated biopsy device. Biopsy specimens were placed in saline and delivered to pathology for further analysis. The introducer needle was removed. Post biopsy axial CT imaging demonstrates no evidence of retroperitoneal hematoma, hemorrhage or other complication.  The patient tolerated the procedure well.  COMPLICATIONS: None  Estimated blood loss: 0  IMPRESSION: Technically successful CT-guided core biopsy of left para-aortic retroperitoneal adenopathy.  Signed,  Criselda Peaches, MD  [facility]  [facility]   Electronically Signed   By: Jacqulynn Cadet M.D.   On: 04/13/2015 12:33   Dg Chest Port 1 View  04/20/2015   CLINICAL DATA:  Evaluate PICC line placement  EXAM: PORTABLE CHEST - 1 VIEW  COMPARISON:  04/17/2015  FINDINGS: Left PICC line has been placed with tip about 2.8 cm above the cavoatrial junction. No pneumothorax.  Stable cardiac enlargement. Increased moderately severe bilateral interstitial prominence suggesting edema.  IMPRESSION: PICC line as described with no pneumothorax.  Diffuse interstitial opacities suggesting pulmonary edema.   Electronically Signed   By: Skipper Cliche M.D.   On: 04/20/2015 14:49   Dg Chest Portable 1 View  04/17/2015   CLINICAL DATA:  GI bleeding and hypotension  EXAM: PORTABLE CHEST - 1 VIEW  COMPARISON:  03/17/2015  FINDINGS: Mild to moderate cardiac enlargement with uncoiling and calcification of the aorta. Vascular pattern normal. No consolidation or effusion.   IMPRESSION: No active disease.   Electronically Signed   By: Skipper Cliche M.D.   On: 04/17/2015 09:31    Microbiology: Recent Results (from the past 240 hour(s))  Culture, blood (x 2)     Status: None (Preliminary result)   Collection Time: 04/17/15 10:39 AM  Result Value Ref Range Status   Specimen Description BLOOD LEFT ANTECUBITAL  Final   Special Requests BOTTLES DRAWN AEROBIC AND ANAEROBIC 5 [place]  Final   Culture   Final    NO GROWTH 4 DAYS Performed at [facility]    Report Status PENDING  Incomplete  Culture, blood (x 2)     Status: None (Preliminary result)   Collection Time: 04/17/15 10:39 AM  Result Value Ref Range Status   Specimen Description BLOOD LEFT HAND  Final   Special Requests IN PEDIATRIC BOTTLE 1 CC  Final   Culture   Final    NO GROWTH 4 DAYS Performed at [facility]    Report Status PENDING  Incomplete  MRSA PCR Screening     Status: None   Collection Time: 04/17/15  5:00 PM  Result Value Ref Range Status   MRSA by PCR NEGATIVE NEGATIVE Final  Culture, Urine     Status: None   Collection Time: 04/17/15  6:00 PM  Result Value Ref Range Status   Specimen Description URINE, CLEAN CATCH  Final   Special Requests NONE  Final   Culture   Final    NO GROWTH 1 DAY Performed at [facility]    Report Status 04/19/2015 FINAL  Final     Labs: Basic Metabolic Panel:  Recent Labs Lab 04/17/15 0853 04/17/15 2208 04/18/15 0403 04/19/15  0350 04/21/15 1205  NA 140 143 143 144 139  K 5.4* 4.1 3.9 3.7 3.7  CL 106 113* 113* 113* 109  CO2 $Re'24 24 22 23 25  'Fhu$ GLUCOSE 210* 130* 130* 133* 178*  BUN 58* 50* 45* 29* 14  CREATININE 1.17 0.81 0.82 0.79 0.65  CALCIUM 9.9 9.2 9.2 9.1 8.9  MG  --  1.7  --   --   --   PHOS  --  3.0  --   --   --    Liver Function Tests:  Recent Labs Lab 04/17/15 0853 04/17/15 2208  AST 40 30  ALT 14* 11*  ALKPHOS 86 71  BILITOT 0.4 1.3*  PROT 6.6 5.8*  ALBUMIN 2.0* 1.9*    Recent Labs Lab  04/17/15 0853  LIPASE 30   No results for input(s): AMMONIA in the last 168 hours. CBC:  Recent Labs Lab 04/17/15 2208 04/18/15 0403 04/19/15 0350 04/20/15 1143 04/21/15 1205  WBC 18.7* 18.8* 15.2* 15.4* 16.4*  NEUTROABS 16.1*  --   --  12.7*  --   HGB 8.4* 8.4* 7.4* 9.7* 8.8*  HCT 26.6* 27.0* 24.4* 31.6* 29.1*  MCV 86.1 85.2 86.8 88.0 87.9  PLT 247 228 233 252 236   Cardiac Enzymes: No results for input(s): CKTOTAL, CKMB, CKMBINDEX, TROPONINI in the last 168 hours. BNP: BNP (last 3 results) No results for input(s): BNP in the last 8760 hours.  ProBNP (last 3 results) No results for input(s): PROBNP in the last 8760 hours.  CBG:  Recent Labs Lab 04/18/15 0826 04/19/15 0837 04/20/15 0750 04/21/15 0752  GLUCAP 117* 135* 136* 104*

## 2015-04-21 NOTE — Progress Notes (Signed)
RN was notified that patient had 4.41 seconds pause, RN reviewed tele strip and verified pause. Dr. Charlies Silvers was notified. Patient is asymptomatic. Will continue to monitor patient.

## 2015-04-21 NOTE — Progress Notes (Addendum)
No signs of further GI bleeding. The patient has a duodenal ulcer based on recent endoscopy which is felt to be the source of melena and anemia. Most recent hemoglobin 9.7. Anticoagulation has been held. I would recommend continuing PPI therapy and avoiding anticoagulation as long as is safely possible given his cardiac history. I think however that if there is no sign of any bleeding in a week he can probably safely resume anticoagulation. We will sign off at this point. Call us if needed.

## 2015-04-21 NOTE — Care Management Note (Signed)
Case Management Note  Patient Details  Name: John [name]. MRN: [id number] Date of Birth: Nov 04, 1923  Subjective/Objective:                   sepsis Action/Plan:  Discharge planning Expected Discharge Date:   (UNKNOWN)               Expected Discharge Plan:  Skilled Nursing Facility  In-House Referral:  Clinical Social Work  Discharge planning Services  CM Consult  Post Acute Care Choice:  NA Choice offered to:  NA  DME Arranged:    DME Agency:     HH Arranged:    [place] Agency:     Status of Service:  In process, will continue to follow  Medicare Important Message Given:    Date Medicare IM Given:    Medicare IM give by:    Date Additional Medicare IM Given:    Additional Medicare Important Message give by:     If discussed at [place] of Stay Meetings, dates discussed:    Additional Comments: CM notes pt from [facility] and plan is to return to [facility]; CSW aware.  No other CM needs were communicated. Dellie Catholic, RN 04/21/2015, 1:27 PM

## 2015-04-21 NOTE — Progress Notes (Signed)
CSW spoke with pt's daughter this afternoon to assist with d/c planning. Daughter is expecting pt to return to [facility], possibly tomorrow. SNF has been contacted and clinical updates provided. CSW will continue to follow to assist with d/c planning to SNF.  Werner Lean LCSW [phone]

## 2015-04-21 NOTE — Progress Notes (Signed)
Physical Therapy Treatment Patient Details Name: John [name]. MRN: [id number] DOB: [birth year] Today's Date: 04/21/2015    History of Present Illness 81 year old male with past medical history of atrial fibrillation , hypertension, GERD who presented from [facility] SNF to [facility] ED 04/17/15  status post syncopal event the day prior to this admission. Patient also reported dark and melanotic stool. BP 83/48, hgb 7.4.    PT Comments    Pt AxO x 3.  Assisted OOB to [facility] due to loose dark stools.  Assisted with hygiene then amb in hallway using B platform EVA walker for increased support.  Daughter assisted by following with recliner.    Follow Up Recommendations  SNF     Equipment Recommendations  None recommended by PT    Recommendations for Other Services       Precautions / Restrictions Precautions Precautions: Fall Precaution Comments: monitor VS, incontinence, has Depends Restrictions Weight Bearing Restrictions: No    Mobility  Bed Mobility Overal bed mobility: Needs Assistance Bed Mobility: Supine to Sit     Supine to sit: Min assist;Mod assist     General bed mobility comments: min cues for sequence with assist for bringing trunk to upright and increased time to complete scooting to EOB  Transfers Overall transfer level: Needs assistance Equipment used: None   Sit to Stand: Min assist;Mod assist         General transfer comment: 50% VC's on proper hand placement and increased time.  Assisted fro elevated bed to [facility] then x 3 sit to stand to perform hygiene.  Ambulation/Gait Ambulation/Gait assistance: Min assist Ambulation Distance (Feet): 28 Feet Assistive device: Bilateral platform walker (EVA walker)       General Gait Details: used B platform EVA walker for increased support and to increase amb distance.  Very weak.   Stairs            Wheelchair Mobility    Modified Rankin (Stroke Patients Only)       Balance                                     Cognition Arousal/Alertness: Awake/alert Behavior During Therapy: WFL for tasks assessed/performed Overall Cognitive Status: History of cognitive impairments - at baseline                      Exercises      General Comments        Pertinent Vitals/Pain Pain Assessment: No/denies pain    Home Living                      Prior Function            PT Goals (current goals can now be found in the care plan section) Progress towards PT goals: Progressing toward goals    Frequency  Min 3X/week    PT Plan Current plan remains appropriate    Co-evaluation             End of Session Equipment Utilized During Treatment: Gait belt Activity Tolerance: Patient limited by fatigue;Patient tolerated treatment well Patient left: in chair;with call bell/phone within reach     Time: 1210-1240 PT Time Calculation (min) (ACUTE ONLY): 30 min  Charges:  $Gait Training: 8-22 mins $Therapeutic Activity: 8-22 mins  G Codes:      Rica Koyanagi  PTA [facility]  Acute  Rehab Pager      [phone]

## 2015-04-22 DIAGNOSIS — I951 Orthostatic hypotension: Secondary | ICD-10-CM

## 2015-04-22 DIAGNOSIS — D630 Anemia in neoplastic disease: Secondary | ICD-10-CM

## 2015-04-22 DIAGNOSIS — D500 Iron deficiency anemia secondary to blood loss (chronic): Secondary | ICD-10-CM

## 2015-04-22 DIAGNOSIS — D479 Neoplasm of uncertain behavior of lymphoid, hematopoietic and related tissue, unspecified: Secondary | ICD-10-CM

## 2015-04-22 DIAGNOSIS — E875 Hyperkalemia: Secondary | ICD-10-CM

## 2015-04-22 DIAGNOSIS — R590 Localized enlarged lymph nodes: Secondary | ICD-10-CM

## 2015-04-22 DIAGNOSIS — D72829 Elevated white blood cell count, unspecified: Secondary | ICD-10-CM

## 2015-04-22 LAB — CULTURE, BLOOD (ROUTINE X 2)
CULTURE: NO GROWTH
CULTURE: NO GROWTH

## 2015-04-22 MED ORDER — HEPARIN SOD (PORK) LOCK FLUSH 100 UNIT/ML IV SOLN
250.0000 [IU] | INTRAVENOUS | Status: DC | PRN
  Administered 2015-04-22: 250 [IU]
  Filled 2015-04-22 (×2): qty 3

## 2015-04-22 MED ORDER — HEPARIN SOD (PORK) LOCK FLUSH 100 UNIT/ML IV SOLN
250.0000 [IU] | Freq: Every day | INTRAVENOUS | Status: DC
  Filled 2015-04-22: qty 3

## 2015-04-22 NOTE — Progress Notes (Signed)
Patient was seen and examined at bedside. Patient is medically stable for discharge to skilled nursing facility today. No changes in medications since 04/21/2015. Family is aware of discharge plan and in agreement with the discharge and to [facility].  Leisa Lenz [facility] [phone]

## 2015-04-22 NOTE — Progress Notes (Signed)
Gave report to Jobe Marker, RN at [facility]. Left number in case she had additional questions.

## 2015-04-22 NOTE — Progress Notes (Signed)
Physical Therapy Treatment Patient Details Name: John [name]. MRN: [id number] DOB: Nov 11, 1923 Today's Date: 04/22/2015    History of Present Illness 81 year old male with past medical history of atrial fibrillation , hypertension, GERD who presented from [facility] center SNF to [facility] ED 04/17/15  status post syncopal event the day prior to this admission. Patient also reported dark and melanotic stool. BP 83/48, hgb 7.4.    PT Comments    Min A for supine to sit, min/guard to walk 3' with RW, distance limited by HR up to 148 with walking. HR 117-123 at rest.   Follow Up Recommendations  SNF     Equipment Recommendations  None recommended by PT    Recommendations for Other Services       Precautions / Restrictions Precautions Precautions: Fall Precaution Comments: monitor VS, incontinence, has Depends Restrictions Weight Bearing Restrictions: No    Mobility  Bed Mobility Overal bed mobility: Needs Assistance Bed Mobility: Supine to Sit     Supine to sit: Min assist     General bed mobility comments: min cues for sequence with assist for bringing trunk to upright and increased time to complete scooting to EOB  Transfers Overall transfer level: Needs assistance Equipment used: Rolling walker (2 wheeled)   Sit to Stand: Min assist;From elevated surface         General transfer comment:  VC's on proper hand placement and increased time.    Ambulation/Gait   Ambulation Distance (Feet): 3 Feet min/guard for safety Assistive device: Rolling walker (2 wheeled) (EVA walker) Gait Pattern/deviations: Step-to pattern;Trunk flexed;Decreased step length - left;Decreased step length - right;Shuffle   Gait velocity interpretation: at or above normal speed for age/gender General Gait Details: distance limited by HR up to 148 with walking, pt felt weak and fatigued, HR 117-123 at rest, RN notified of HR   Stairs            Wheelchair Mobility    Modified Rankin  (Stroke Patients Only)       Balance     Sitting balance-Leahy Scale: Fair       Standing balance-Leahy Scale: Poor                      Cognition Arousal/Alertness: Awake/alert Behavior During Therapy: WFL for tasks assessed/performed Overall Cognitive Status: Within Functional Limits for tasks assessed                      Exercises      General Comments        Pertinent Vitals/Pain Pain Assessment: No/denies pain    Home Living                      Prior Function            PT Goals (current goals can now be found in the care plan section) Acute Rehab PT Goals Patient Stated Goal: to get strength back PT Goal Formulation: With patient/family Time For Goal Achievement: 05/03/15 Potential to Achieve Goals: Good Progress towards PT goals: Not progressing toward goals - comment (HR)    Frequency  Min 3X/week    PT Plan Current plan remains appropriate    Co-evaluation             End of Session Equipment Utilized During Treatment: Gait belt Activity Tolerance: Patient limited by fatigue;Treatment limited secondary to medical complications (Comment) (HR 148 with walking) Patient left: in  chair;with call bell/phone within reach;with family/visitor present     Time: 1012-1032 PT Time Calculation (min) (ACUTE ONLY): 20 min  Charges:  $Gait Training: 8-22 mins                    G Codes:      Philomena Doheny 04/22/2015, 10:38 AM [phone]

## 2015-04-22 NOTE — Consult Note (Signed)
John [name]  Telephone:([phone]) [place].                                MR#: [id number]  DOB: May 22, 1924                       CSN#: [id number]  Referring MD: Dr. Sheliah Plane [facility]  Patient Care Team: Cassandria Anger, MD as PCP - General Irine Seal, MD as Attending Physician (Urology)  Reason for Consult: Lymphoproliferative disorder   NWG:NFAOZ B Minehart Brooke Bonito. is a 81 y.o. male  John [name]. is a 81 y.o. male with multiple cardiac issues on Coumadin, admitted on 9/18 after presenting for lymph node biopsy for retroperitoneal lymphadenopathy, but complicated with orthostatic hypotension and near syncope, requiring IV fluid resuscitation. He reported melanotic stools as well. He was septic for which broad spectrum antibiotics were initiated. GI consulted for possible GIB, but conservative management was recommended due to risk of duodenal bleeding.  He was found to have abnormal CBC with a Hb 7.4, 3 grs lower than 4 days prior to admission at 10.4 for which he received 2 units of blood, and his coumadin was discontinued. WBC was 23.2, normal platelets. He was febrile. Other findings included hyperkalemia and mildly elevated INR at 2.05. CXR was normal.  CT abdomen and pelvis on 8/25 had shown bulky retroperitoneal/paraortic/pericaval and portocaval lymphadenopathy suspicious for lymphoma or other hematological malignancy. As mentioned above, Biopsy was performed on 9/14, with formal report pending, but preliminary showing possible myeloproliferative disorder. Of note, he has been seen in 2012 at the [facility] for chronic thrombocytopenia on observation, not seen since that time. He denies any family history of leukemia, lymphoma or other bone marrow disorders.     Denies fevers, chills, night sweats or mucositis. Denies any productive cough or hemoptysis. Denies any chest pain or palpitations. Denies  lower extremity swelling. He had generalized malaise, having lost 30 lbs in 5 months. He has chronic urine incontinence. Denies any bleeding issues such as epistaxis, hematemesis, hematuria or hematochezia. Denies headaches, seizures, vision changes, or bowel incontinence, No confusion is reported.  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 The patient has no prior diagnosis of autoimmune disease and was not prescribed corticosteroids related products. He denies any risk factors for HIV or hepatitis.He is a remote smoker.  Oncology was requested to see this patient with recommendations.        PMH:  Past Medical History  Diagnosis Date  . Anemia     low blood platelets  . Arthritis   . Blood transfusion   . Cancer     skin CA  . Cataract   . Heart murmur   . Hyperlipidemia   . Hypertension   . TIA (transient ischemic attack)   . Atrial fibrillation     on Coumadin  . Myocardial infarction     after colon surgery    Surgeries:  Past Surgical History  Procedure Laterality Date  . Colon surgery      partial colectomy  . Total hip arthroplasty      LEFT HIP  . Coronary artery bypass graft  3 vessels  . Heart stent    . Thoracotomy    . Lens implants    . Colon surgery      right  . Esophagogastroduodenoscopy Left 04/17/2015    Procedure: ESOPHAGOGASTRODUODENOSCOPY (EGD);  Surgeon: Teena Irani, MD;  Location: [facility];  Service: Endoscopy;  Laterality: Left;    Allergies:  Allergies  Allergen Reactions  . Polysporin [Bacitracin-Polymyxin B] Other (See Comments)    Doesn't remember reaction    Medications:  Scheduled Meds: . antiseptic oral rinse  7 mL Mouth Rinse q12n4p  . cefTRIAXone (ROCEPHIN)  IV  1 g Intravenous Q0600  . chlorhexidine  15 mL Mouth Rinse BID  . feeding supplement (ENSURE ENLIVE)  237 mL  Oral BID BM  . pantoprazole  40 mg Oral BID  . sodium chloride  3 mL Intravenous Q12H   Continuous Infusions: . sodium chloride 10 mL/hr at 04/21/15 1150   PRN Meds:.acetaminophen **OR** acetaminophen, ondansetron **OR** ondansetron (ZOFRAN) IV, sodium chloride   ROS: Constitutional: Denies fevers, chills or abnormal night sweats Eyes: Denies blurriness of vision, double vision or watery eyes Ears, nose, mouth, throat, and face: Denies mucositis or sore throat Respiratory: Denies cough, dyspnea at rest or wheezes Cardiovascular: Denies palpitation, chest discomfort or lower extremity swelling. No further presyncopal episodes Gastrointestinal:  Denies nausea, heartburn. He reports black stools. Skin: Denies abnormal skin rashes.He has known sacral ulcer treated by wound care. He has a history of squamous cell and actinic keratoses Lymphatics: Denies new lymphadenopathy or easy bruising Neurological: Denies numbness, tingling or new weaknesses Behavioral/Psych: Mood is stable, no new changes  All other systems were reviewed with the patient and are negative.   Family History:    Family History  Problem Relation Age of Onset  . Colon polyps Mother   . Stroke Father   . Stroke Other     No family history of hematological  disorders.  Social History:  reports that he quit smoking about 49 years ago. He does not have any smokeless tobacco history on file. He reports that he drinks alcohol. His drug history is not on file. He is widowed. Lives in nursing home in [place].   Physical Exam:  Filed Vitals:   04/22/15 0525  BP: 118/66  Pulse: 98  Temp: 98.5 F (36.9 C)  Resp:    Filed Weights   04/19/15 0500 04/21/15 0417 04/22/15 0525  Weight: 169 lb 8.5 oz (76.9 kg) 174 lb 1.6 oz (78.971 kg) 177 lb 11.1 oz (80.6 kg)    GENERAL:alert, no distress and comfortable SKIN: skin color, texture, turgor are normal, no rashes He has known sacral ulcer treated by wound care EYES:  normal, conjunctiva are pink and non-injected, sclera clear OROPHARYNX:no exudate, no erythema and lips, buccal mucosa, and tongue normal  NECK: supple, thyroid normal size, non-tender, without nodularity LYMPH:  no palpable lymphadenopathy in the cervical, axillary or inguinal area LUNGS: clear to auscultation and percussion with normal breathing effort HEART: regular rate & rhythm and no murmurs and no lower extremity edema ABDOMEN:abdomen soft, non-tender and normal bowel sounds Musculoskeletal:no cyanosis of digits and no clubbing  PSYCH: alert & oriented x 3 with fluent speech NEURO: no focal motor/sensory deficits   Labs:  CBC   Recent Labs Lab 04/17/15 2208 04/18/15 0403 04/19/15 0350 04/20/15 1143 04/21/15 1205  WBC 18.7* 18.8* 15.2* 15.4* 16.4*  HGB 8.4* 8.4* 7.4* 9.7* 8.8*  HCT 26.6* 27.0* 24.4* 31.6* 29.1*  PLT 247 228 233 252 236  MCV 86.1 85.2 86.8 88.0 87.9  MCH 27.2 26.5 26.3 27.0 26.6  MCHC 31.6 31.1 30.3 30.7 30.2  RDW 16.7* 17.0* 17.1* 17.2* 17.0*  LYMPHSABS 1.3  --   --  1.1  --   MONOABS 1.1*  --   --  1.2*  --   EOSABS 0.2  --   --  0.4  --   BASOSABS 0.0  --   --  0.0  --      CMP    Recent Labs Lab 04/17/15 0853 04/17/15 2208 04/18/15 0403 04/19/15 0350 04/21/15 1205  NA 140 143 143 144 139  K 5.4* 4.1 3.9 3.7 3.7  CL 106 113* 113* 113* 109  CO2 24 24 22 23 25   GLUCOSE 210* 130* 130* 133* 178*  BUN 58* 50* 45* 29* 14  CREATININE 1.17 0.81 0.82 0.79 0.65  CALCIUM 9.9 9.2 9.2 9.1 8.9  MG  --  1.7  --   --   --   AST 40 30  --   --   --   ALT 14* 11*  --   --   --   ALKPHOS 86 71  --   --   --   BILITOT 0.4 1.3*  --   --   --         Component Value Date/Time   BILITOT 1.3* 04/17/2015 2208   BILIDIR 0.2 03/17/2015 1715      Recent Labs Lab 04/17/15 0853 04/17/15 2208 04/18/15 0403  INR 2.05* 2.16* 2.39*      Imaging Studies:  Ct Abdomen Pelvis W Contrast  03/24/2015   CLINICAL DATA:  Recent weight loss with  weakness, history bowel surgery  EXAM: CT ABDOMEN AND PELVIS WITH CONTRAST  TECHNIQUE: Multidetector CT imaging of the abdomen and pelvis was performed using the standard protocol following bolus administration of intravenous contrast.  CONTRAST:  100 mL Omnipaque 300  COMPARISON:  05/12/2004  FINDINGS: Lung bases demonstrate mild right basilar scarring. No focal infiltrate or sizable effusion is seen.  The liver again demonstrates a hypodense lesion within the left lobe consistent with a small cysts. The spleen, gallbladder, adrenal glands and pancreas are within normal limits. The kidneys are well visualized bilaterally and again demonstrate bilateral renal cysts right greater than left. The largest of these lies within the midportion of the right kidney measuring 5 cm in greatest dimension.  There is new bulky lymphadenopathy identified in the pericaval and periaortic region. The largest of these areas lies interposed between the aorta in the left kidney and measures approximately 4.1 by 4.3 cm. This is best seen on image number 27 of series 2. A 9 mm short axis lymph node is noted adjacent to the distal esophagus best seen on image number 7 of series 2 a large lymph node complex is noted in the portacaval space best seen on image number 22 of series 2. This measures 3.6 x 5.9 cm in greatest dimension.  The gastric wall is diffusely thickened although this is of uncertain significance as nose distension is seen. Postsurgical changes are noted with partial colonic resection. No obstructive changes are seen. Contrast passes into the colon without difficulty. Aortoiliac calcifications are noted without aneurysmal dilatation.  The bladder is well distended. The prostate is mildly enlarged. No pelvic lymphadenopathy is seen. A large left inguinal hernia is noted with colon within. No incarceration is noted. No significant inguinal lymphadenopathy is noted. The osseous structures show changes of prior left hip  replacement and degenerative change of the lumbar spine. No compression deformities are seen.  IMPRESSION: Stable hepatic and renal cysts.  Multiple lymph nodes as described above predominately in the periaortic, pericaval and portacaval space. These are highly suggestive of underlying lymphoma. Some of these in the periaortic and pericaval region would be amenable to percutaneous biopsy.  Diffuse thickening of the gastric wall of uncertain significance as there is no significant distension identified.  These results will be called to the ordering clinician or representative by the Radiologist Assistant, and communication documented in the PACS or zVision Dashboard.   Electronically Signed   By: Inez Catalina M.D.   On: 03/24/2015 07:52   Ct Biopsy  04/13/2015   CLINICAL DATA:  81 year old male with newly diagnosed extensive retroperitoneal and abdominal adenopathy concerning for lymphoma. CT-guided biopsy of retroperitoneal adenopathy is warranted for tissue diagnosis.  EXAM: CT BIOPSY  Date: 04/13/2015  PROCEDURE: 1. CT-guided core biopsy left para-aortic retroperitoneal adenopathy Interventional Radiologist:  Criselda Peaches, MD  ANESTHESIA/SEDATION: Moderate (conscious) sedation was used. 0.5 mg Versed, 12.5 mcg Fentanyl were administered intravenously. The patient's vital signs were monitored continuously by radiology nursing throughout the procedure.  Sedation Time: 6 minutes  MEDICATIONS: None additional  TECHNIQUE: Informed consent was obtained from the patient following explanation of the procedure, risks, benefits and alternatives. The patient understands, agrees and consents for the procedure. All questions were addressed. A time out was performed.  A planning axial CT scan was performed. Bulky adenopathy in the left para-aortic station was successfully identified. A suitable skin entry site was selected and marked. The region was then sterilely prepped and draped in standard fashion using Betadine  skin prep. Local anesthesia was attained by infiltration with 1% lidocaine. A small dermatotomy was made. Under intermittent CT fluoroscopic guidance, a 17 gauge introducer needle was advanced to the margin of the retroperitoneal mass. Multiple 18 gauge core biopsies were then coaxially obtained using the bio Pince automated biopsy device. Biopsy specimens were placed in saline and delivered to pathology for further analysis. The introducer needle was removed. Post biopsy axial CT imaging demonstrates no evidence of retroperitoneal hematoma, hemorrhage or other complication.  The patient tolerated the procedure well.  COMPLICATIONS: None  Estimated blood loss: 0  IMPRESSION: Technically successful CT-guided core biopsy of left para-aortic retroperitoneal adenopathy.  Signed,  Criselda Peaches, MD  [facility]  [facility]   Electronically Signed   By: Jacqulynn Cadet M.D.   On: 04/13/2015 12:33   Dg Chest Port 1 View  04/20/2015   CLINICAL DATA:  Evaluate PICC line placement  EXAM: PORTABLE CHEST - 1 VIEW  COMPARISON:  04/17/2015  FINDINGS: Left PICC line has been placed with tip about 2.8 cm above the cavoatrial junction. No pneumothorax.  Stable cardiac enlargement. Increased moderately severe bilateral interstitial prominence suggesting edema.  IMPRESSION: PICC line as described with no pneumothorax.  Diffuse interstitial opacities suggesting pulmonary edema.   Electronically Signed   By: Skipper Cliche M.D.   On: 04/20/2015 14:49   Dg Chest Portable 1 View  04/17/2015   CLINICAL DATA:  GI bleeding and hypotension  EXAM: PORTABLE CHEST - 1 VIEW  COMPARISON:  03/17/2015  FINDINGS: Mild to moderate cardiac enlargement with uncoiling and calcification of the aorta. Vascular pattern normal. No consolidation or effusion.  IMPRESSION: No active disease.   Electronically Signed   By: Skipper Cliche M.D.   On: 04/17/2015 09:31      A/P:  81 y.o. male with    Presumed Lymphoproliferative disorder CT of the abdomen on 9/14 prior to admission revealed multiple lymph nodes as described above predominately in the periaortic, pericaval and portacaval space. These are highly suggestive of underlying lymphoma versus other hematological/oncological malignancy  Preliminary report CT-guided biopsy of retroperitoneal adenopathy on 9/14 suggests lymphoproliferative disorder. Formal path report is unavailable at the time of consultation An appointment at the [facility] can be arranged upon discharge at which time other specific labs can be ordered such as SPEP/IFE, UPEP/IFE.  Hemorrhagic shock Acute upper GI hemorrhage Acute blood loss anemia due to anticoagulation Anemia in neoplastic disease He presented with melanotic stools while on Coumadin Anticoagulation was held for 1 week until no evidence of bleed, and he is being discharged on ASA He received total of 3 units during hospitalization, 2 on 9/18 and 1 on 9/20, stabilized.  GI recommends supportive care, no invasive procedures due to risk of duodenal bleeding CBC, smear, Iron studies, Epo, can be checked upon appointment at the [facility] once formal diagnosis is available   Leukocytosis In the setting of myeloproliferative disorder per CT guided biopsy preliminary report; there is a reactive component as well due to sepsis. This has remained stable throughout hospitalization Initial WBC 18.7, last value on 9/22 at 16.8 Formal path report is unavailable at the time of consultation An appointment at the [facility] can be arranged upon discharge.   DVT prophylaxis Anticoagulation was held due to GI bleed He remained on SCDs while hospitalized.  Other medical issues including A fib, UTI, malnutrition, pressure ulcer, hypertension as per admitting team  [facility] E, PA-C 04/22/2015 6:46 AM    Attending Note  I personally saw the patient, reviewed the chart and examined the  patient. The plan of care was discussed with the patient. I agree with the assessment and plan as documented above. Thank you very much for the consultation.  [name] is an 81 year old gentleman who presented to the hospital with complaints of weakness and syncope. He was found to have melanoma. He had a CT of his abdomen performed which revealed bulky retroperitoneal lymphadenopathy. He underwent CT-guided biopsy and the results are pending. He reports that he had lost 50 pounds in the last 6 months. He has gotten markedly weaker and frail. He denies any fevers chills or night sweats. He is currently being treated for sepsis with antibiotics. We are consulted to assist with oncologic care and management.  Assessment and plan 1. Bulky retroperitoneal lymphadenopathy: Status post CT-guided biopsy. We do not have the pathology report.  I discussed with him about different possibilities including lymphoma. Patient is of the opinion that he had lived a long unproductive life and that he would not be interested in receiving any specific anticancer treatments. He would want [place] to keep him comfortable.  I will discuss the pathology results with his family on the telephone as well as discussed the prognosis and plan. If it is a malignancy and he chooses not to undergo any treatment, hospice may be an option. He fully understands hospice and has great opinion of them. His wife of 70 years of marriage had died from Parkinson's disease about a year and half ago. She was on hospice at the end of life.  2. If we felt that he needs follow-up, I will arrange a follow-up to see me as an outpatient. 3. GI bleed: I agree with discontinuation of anticoagulation. 4. Patient has extensive comorbidities including heart disease,  atrial fibrillation, hypertension, history of a collapsed lung, malnutrition, weight loss etc. He however has excellent memory and mental faculties. He understands fully the discussion regarding  cancer and its treatments. His opinion was that I discuss everything with his daughter who is his power of attorney.

## 2015-04-22 NOTE — Progress Notes (Signed)
Pt / family are in agreement with d/c to [facility] today. [facility] transport required. D/C summary sent to SNF prior to d/c for review.   Werner Lean LCSW [phone]

## 2015-05-10 ENCOUNTER — Encounter: Payer: Medicare Other | Admitting: Internal Medicine

## 2015-05-10 DIAGNOSIS — Z0289 Encounter for other administrative examinations: Secondary | ICD-10-CM

## 2015-05-14 ENCOUNTER — Other Ambulatory Visit: Payer: Self-pay | Admitting: Internal Medicine

## 2015-05-15 ENCOUNTER — Other Ambulatory Visit: Payer: Self-pay | Admitting: Internal Medicine

## 2015-05-31 DEATH — deceased

## 2015-09-27 ENCOUNTER — Telehealth: Payer: Self-pay

## 2015-09-27 NOTE — Telephone Encounter (Signed)
LVM for pt to call back as soon as possible.   RE: Flu Vaccine
